# Patient Record
Sex: Female | Born: 1935 | Race: Black or African American | Hispanic: No | Marital: Married | Smoking: Never smoker
Health system: Southern US, Community
[De-identification: ages and names within clinical notes are randomized; demographics above are authoritative.]

## PROBLEM LIST (undated history)

## (undated) DIAGNOSIS — E43 Unspecified severe protein-calorie malnutrition: Secondary | ICD-10-CM

## (undated) DIAGNOSIS — L89614 Pressure ulcer of right heel, stage 4: Secondary | ICD-10-CM

## (undated) DIAGNOSIS — I10 Essential (primary) hypertension: Secondary | ICD-10-CM

## (undated) DIAGNOSIS — I639 Cerebral infarction, unspecified: Secondary | ICD-10-CM

## (undated) DIAGNOSIS — E785 Hyperlipidemia, unspecified: Secondary | ICD-10-CM

## (undated) DIAGNOSIS — D473 Essential (hemorrhagic) thrombocythemia: Secondary | ICD-10-CM

## (undated) DIAGNOSIS — F0390 Unspecified dementia without behavioral disturbance: Secondary | ICD-10-CM

## (undated) DIAGNOSIS — D471 Chronic myeloproliferative disease: Secondary | ICD-10-CM

## (undated) DIAGNOSIS — E039 Hypothyroidism, unspecified: Secondary | ICD-10-CM

## (undated) HISTORY — PX: CORONARY ARTERY BYPASS GRAFT: SHX141

## (undated) HISTORY — DX: Chronic myeloproliferative disease: D47.1

## (undated) HISTORY — PX: TOTAL HIP ARTHROPLASTY: SHX124

---

## 2015-02-27 ENCOUNTER — Inpatient Hospital Stay (HOSPITAL_COMMUNITY)
Admission: EM | Admit: 2015-02-27 | Discharge: 2015-03-02 | DRG: 065 | Disposition: A | Discharge status: Home Health | Payer: Medicare HMO | Attending: Internal Medicine | Admitting: Internal Medicine

## 2015-02-27 ENCOUNTER — Emergency Department (HOSPITAL_COMMUNITY): Payer: Medicare HMO

## 2015-02-27 ENCOUNTER — Encounter (HOSPITAL_COMMUNITY): Payer: Self-pay

## 2015-02-27 DIAGNOSIS — R29810 Facial weakness: Secondary | ICD-10-CM | POA: Diagnosis present

## 2015-02-27 DIAGNOSIS — N289 Disorder of kidney and ureter, unspecified: Secondary | ICD-10-CM

## 2015-02-27 DIAGNOSIS — R4182 Altered mental status, unspecified: Secondary | ICD-10-CM | POA: Diagnosis not present

## 2015-02-27 DIAGNOSIS — R4701 Aphasia: Secondary | ICD-10-CM | POA: Diagnosis present

## 2015-02-27 DIAGNOSIS — N189 Chronic kidney disease, unspecified: Secondary | ICD-10-CM | POA: Diagnosis not present

## 2015-02-27 DIAGNOSIS — Z66 Do not resuscitate: Secondary | ICD-10-CM | POA: Diagnosis present

## 2015-02-27 DIAGNOSIS — Y92003 Bedroom of unspecified non-institutional (private) residence as the place of occurrence of the external cause: Secondary | ICD-10-CM

## 2015-02-27 DIAGNOSIS — I25709 Atherosclerosis of coronary artery bypass graft(s), unspecified, with unspecified angina pectoris: Secondary | ICD-10-CM | POA: Diagnosis not present

## 2015-02-27 DIAGNOSIS — I639 Cerebral infarction, unspecified: Secondary | ICD-10-CM | POA: Diagnosis not present

## 2015-02-27 DIAGNOSIS — N183 Chronic kidney disease, stage 3 (moderate): Secondary | ICD-10-CM | POA: Diagnosis not present

## 2015-02-27 DIAGNOSIS — M21371 Foot drop, right foot: Secondary | ICD-10-CM | POA: Diagnosis present

## 2015-02-27 DIAGNOSIS — G35 Multiple sclerosis: Secondary | ICD-10-CM

## 2015-02-27 DIAGNOSIS — R402252 Coma scale, best verbal response, oriented, at arrival to emergency department: Secondary | ICD-10-CM | POA: Diagnosis present

## 2015-02-27 DIAGNOSIS — R404 Transient alteration of awareness: Secondary | ICD-10-CM | POA: Diagnosis not present

## 2015-02-27 DIAGNOSIS — R69 Illness, unspecified: Secondary | ICD-10-CM | POA: Diagnosis not present

## 2015-02-27 DIAGNOSIS — I129 Hypertensive chronic kidney disease with stage 1 through stage 4 chronic kidney disease, or unspecified chronic kidney disease: Secondary | ICD-10-CM | POA: Diagnosis present

## 2015-02-27 DIAGNOSIS — F0390 Unspecified dementia without behavioral disturbance: Secondary | ICD-10-CM | POA: Diagnosis present

## 2015-02-27 DIAGNOSIS — R001 Bradycardia, unspecified: Secondary | ICD-10-CM | POA: Diagnosis not present

## 2015-02-27 DIAGNOSIS — I6789 Other cerebrovascular disease: Secondary | ICD-10-CM | POA: Diagnosis not present

## 2015-02-27 DIAGNOSIS — R402362 Coma scale, best motor response, obeys commands, at arrival to emergency department: Secondary | ICD-10-CM | POA: Diagnosis present

## 2015-02-27 DIAGNOSIS — I2510 Atherosclerotic heart disease of native coronary artery without angina pectoris: Secondary | ICD-10-CM | POA: Diagnosis present

## 2015-02-27 DIAGNOSIS — R739 Hyperglycemia, unspecified: Secondary | ICD-10-CM | POA: Diagnosis present

## 2015-02-27 DIAGNOSIS — I6340 Cerebral infarction due to embolism of unspecified cerebral artery: Secondary | ICD-10-CM | POA: Diagnosis not present

## 2015-02-27 DIAGNOSIS — E039 Hypothyroidism, unspecified: Secondary | ICD-10-CM | POA: Diagnosis not present

## 2015-02-27 DIAGNOSIS — E785 Hyperlipidemia, unspecified: Secondary | ICD-10-CM | POA: Diagnosis present

## 2015-02-27 DIAGNOSIS — I6330 Cerebral infarction due to thrombosis of unspecified cerebral artery: Secondary | ICD-10-CM | POA: Diagnosis not present

## 2015-02-27 DIAGNOSIS — Z7902 Long term (current) use of antithrombotics/antiplatelets: Secondary | ICD-10-CM | POA: Diagnosis not present

## 2015-02-27 DIAGNOSIS — R29708 NIHSS score 8: Secondary | ICD-10-CM | POA: Diagnosis present

## 2015-02-27 DIAGNOSIS — F0391 Unspecified dementia with behavioral disturbance: Secondary | ICD-10-CM | POA: Diagnosis not present

## 2015-02-27 DIAGNOSIS — R402142 Coma scale, eyes open, spontaneous, at arrival to emergency department: Secondary | ICD-10-CM | POA: Diagnosis present

## 2015-02-27 DIAGNOSIS — G8194 Hemiplegia, unspecified affecting left nondominant side: Secondary | ICD-10-CM | POA: Diagnosis present

## 2015-02-27 DIAGNOSIS — I10 Essential (primary) hypertension: Secondary | ICD-10-CM | POA: Diagnosis not present

## 2015-02-27 DIAGNOSIS — W06XXXA Fall from bed, initial encounter: Secondary | ICD-10-CM | POA: Diagnosis not present

## 2015-02-27 DIAGNOSIS — R471 Dysarthria and anarthria: Secondary | ICD-10-CM | POA: Diagnosis present

## 2015-02-27 DIAGNOSIS — M7989 Other specified soft tissue disorders: Secondary | ICD-10-CM | POA: Diagnosis not present

## 2015-02-27 DIAGNOSIS — Z8669 Personal history of other diseases of the nervous system and sense organs: Secondary | ICD-10-CM | POA: Diagnosis not present

## 2015-02-27 HISTORY — DX: Essential (primary) hypertension: I10

## 2015-02-27 HISTORY — DX: Unspecified dementia, unspecified severity, without behavioral disturbance, psychotic disturbance, mood disturbance, and anxiety: F03.90

## 2015-02-27 LAB — COMPREHENSIVE METABOLIC PANEL
ALBUMIN: 3.3 g/dL — AB (ref 3.5–5.0)
ALT: 19 U/L (ref 14–54)
ANION GAP: 10 (ref 5–15)
AST: 24 U/L (ref 15–41)
Alkaline Phosphatase: 73 U/L (ref 38–126)
BUN: 38 mg/dL — ABNORMAL HIGH (ref 6–20)
CHLORIDE: 106 mmol/L (ref 101–111)
CO2: 24 mmol/L (ref 22–32)
Calcium: 9.8 mg/dL (ref 8.9–10.3)
Creatinine, Ser: 1.37 mg/dL — ABNORMAL HIGH (ref 0.44–1.00)
GFR calc Af Amer: 40 mL/min — ABNORMAL LOW (ref >60)
GFR calc non Af Amer: 34 mL/min — ABNORMAL LOW (ref >60)
GLUCOSE: 155 mg/dL — AB (ref 65–99)
POTASSIUM: 3.9 mmol/L (ref 3.5–5.1)
SODIUM: 140 mmol/L (ref 135–145)
TOTAL PROTEIN: 6.9 g/dL (ref 6.5–8.1)
Total Bilirubin: 0.3 mg/dL (ref 0.3–1.2)

## 2015-02-27 LAB — I-STAT CHEM 8, ED
BUN: 40 mg/dL — ABNORMAL HIGH (ref 6–20)
CALCIUM ION: 1.22 mmol/L (ref 1.13–1.30)
CHLORIDE: 107 mmol/L (ref 101–111)
Creatinine, Ser: 1.3 mg/dL — ABNORMAL HIGH (ref 0.44–1.00)
GLUCOSE: 147 mg/dL — AB (ref 65–99)
HCT: 49 % — ABNORMAL HIGH (ref 36.0–46.0)
Hemoglobin: 16.7 g/dL — ABNORMAL HIGH (ref 12.0–15.0)
Potassium: 3.8 mmol/L (ref 3.5–5.1)
Sodium: 142 mmol/L (ref 135–145)
TCO2: 24 mmol/L (ref 0–100)

## 2015-02-27 LAB — TSH: TSH: 3.687 u[IU]/mL (ref 0.350–4.500)

## 2015-02-27 LAB — CBC
HEMATOCRIT: 45.3 % (ref 36.0–46.0)
HEMOGLOBIN: 14.7 g/dL (ref 12.0–15.0)
MCH: 25.3 pg — ABNORMAL LOW (ref 26.0–34.0)
MCHC: 32.5 g/dL (ref 30.0–36.0)
MCV: 77.8 fL — AB (ref 78.0–100.0)
Platelets: 863 10*3/uL — ABNORMAL HIGH (ref 150–400)
RBC: 5.82 MIL/uL — AB (ref 3.87–5.11)
RDW: 16.9 % — AB (ref 11.5–15.5)
WBC: 12.1 10*3/uL — AB (ref 4.0–10.5)

## 2015-02-27 LAB — RAPID URINE DRUG SCREEN, HOSP PERFORMED
Amphetamines: NOT DETECTED
BENZODIAZEPINES: NOT DETECTED
Barbiturates: NOT DETECTED
COCAINE: NOT DETECTED
OPIATES: NOT DETECTED
TETRAHYDROCANNABINOL: NOT DETECTED

## 2015-02-27 LAB — URINALYSIS, ROUTINE W REFLEX MICROSCOPIC
Bilirubin Urine: NEGATIVE
GLUCOSE, UA: NEGATIVE mg/dL
KETONES UR: NEGATIVE mg/dL
Nitrite: POSITIVE — AB
PH: 5.5 (ref 5.0–8.0)
Protein, ur: NEGATIVE mg/dL
Specific Gravity, Urine: 1.01 (ref 1.005–1.030)

## 2015-02-27 LAB — DIFFERENTIAL
BASOS PCT: 0 %
Basophils Absolute: 0 10*3/uL (ref 0.0–0.1)
EOS ABS: 0.3 10*3/uL (ref 0.0–0.7)
EOS PCT: 3 %
LYMPHS ABS: 1.6 10*3/uL (ref 0.7–4.0)
LYMPHS PCT: 13 %
MONOS PCT: 4 %
Monocytes Absolute: 0.5 10*3/uL (ref 0.1–1.0)
NEUTROS ABS: 9.7 10*3/uL — AB (ref 1.7–7.7)
Neutrophils Relative %: 80 %

## 2015-02-27 LAB — I-STAT TROPONIN, ED: Troponin i, poc: 0.02 ng/mL (ref 0.00–0.08)

## 2015-02-27 LAB — URINE MICROSCOPIC-ADD ON

## 2015-02-27 LAB — APTT: aPTT: 31 seconds (ref 24–37)

## 2015-02-27 LAB — AMMONIA: Ammonia: 34 umol/L (ref 9–35)

## 2015-02-27 LAB — PROTIME-INR
INR: 1.19 (ref 0.00–1.49)
Prothrombin Time: 15.2 seconds (ref 11.6–15.2)

## 2015-02-27 LAB — ETHANOL

## 2015-02-27 NOTE — Consult Note (Signed)
Stroke Consult Consulting Physician: Dr Tyrone Nine ED  Chief Complaint: altered mental status  HPI: Rachel [name] is an 81 y.o. female hx of HTN and dementia brought in by EMS with transient altered mental status and speech difficulties. History obtained via patients family. They report patient had a fall this morning but appeared fine immediately after. Later noted to have change in her speech, increased slurring and appeared more confused. Family notes she did not appear to be using her left hand as well. These symptoms lasted around 1 hour though family reports she still is not back to her baseline.   CT head imaging shows no acute process.   Date last known well: 02/27/2015 Time last known well: 0800 tPA Given: No: Outside tPA window Modified Rankin: Rankin Score=3  Past Medical History  Diagnosis Date  . Dementia   . Hypertension     History reviewed. No pertinent past surgical history.  History reviewed. No pertinent family history. Social History:  reports that she has never smoked. She has never used smokeless tobacco. She reports that she does not drink alcohol or use illicit drugs.  Allergies: Allergies not on file   (Not in a hospital admission)  ROS: Out of a complete 14 system review, the patient complains of only the following symptoms, and all other reviewed systems are negative. +confusion, slurred speech   Physical Examination: Filed Vitals:   02/27/15 1831 02/27/15 1844  BP:    Pulse: 53 57  Temp:    Resp: 15 18   Physical Exam  Constitutional: He appears well-developed and well-nourished.  Psych: Affect appropriate to situation Eyes: No scleral injection HENT: No OP obstrucion Head: Normocephalic.  Cardiovascular: Normal rate and regular rhythm.  Respiratory: Effort normal and breath sounds normal.  GI: Soft. Bowel sounds are normal. No distension. There is no tenderness.  Skin: WDI  Neurologic Examination: Mental Status: Alert, oriented to  name only.. Difficulty naming objects. Will follow simple commands. Mild dysarthria noted.  Cranial Nerves: II: optic discs not visualized, blinks to threat bilaterally, pupils equal, round, reactive to light  III,IV, VI: ptosis left eye, extra-ocular motions intact bilaterally V,VII: left facial weakness (family notes is chronic), facial light touch sensation normal bilaterally VIII: hearing normal bilaterally IX,X: gag reflex present XI: trapezius strength/neck flexion strength normal bilaterally XII: tongue strength normal  Motor: Unable to formally test due to mental status. Decreased movement of left UE compared to right Tone and bulk:normal tone throughout; no atrophy noted Sensory: Pinprick and light touch intact throughout, bilaterally Deep Tendon Reflexes: 1+ and symmetric throughout Plantars: Right: downgoing   Left: downgoing Cerebellar: Not following commands, unable to test Gait: deferred  Laboratory Studies:   Basic Metabolic Panel:  Recent Labs Lab 02/27/15 1855  NA 142  K 3.8  CL 107  GLUCOSE 147*  BUN 40*  CREATININE 1.30*    Liver Function Tests: No results for input(s): AST, ALT, ALKPHOS, BILITOT, PROT, ALBUMIN in the last 168 hours. No results for input(s): LIPASE, AMYLASE in the last 168 hours. No results for input(s): AMMONIA in the last 168 hours.  CBC:  Recent Labs Lab 02/27/15 1855  HGB 16.7*  HCT 49.0*    Cardiac Enzymes: No results for input(s): CKTOTAL, CKMB, CKMBINDEX, TROPONINI in the last 168 hours.  BNP: Invalid input(s): POCBNP  CBG: No results for input(s): GLUCAP in the last 168 hours.  Microbiology: No results found for this or any previous visit.  Coagulation Studies: No results for input(s): LABPROT, INR  in the last 72 hours.  Urinalysis: No results for input(s): COLORURINE, LABSPEC, PHURINE, GLUCOSEU, HGBUR, BILIRUBINUR, KETONESUR, PROTEINUR, UROBILINOGEN, NITRITE, LEUKOCYTESUR in the last 168 hours.  Invalid  input(s): APPERANCEUR  Lipid Panel:  No results found for: CHOL, TRIG, HDL, CHOLHDL, VLDL, LDLCALC  HgbA1C: No results found for: HGBA1C  Urine Drug Screen:  No results found for: LABOPIA, COCAINSCRNUR, LABBENZ, AMPHETMU, THCU, LABBARB  Alcohol Level: No results for input(s): ETH in the last 168 hours.  Other results:  Imaging: Ct Head Wo Contrast  02/27/2015  CLINICAL DATA:  Altered mental status, history of dementia. Left facial droop EXAM: CT HEAD WITHOUT CONTRAST TECHNIQUE: Contiguous axial images were obtained from the base of the skull through the vertex without contrast. COMPARISON:  None FINDINGS: Diffuse brain atrophy with chronic white matter microvascular ischemic changes throughout the cerebral hemispheres. Cerebellar atrophy as well. No acute intracranial hemorrhage, definite infarction, mass lesion, midline shift, herniation, hydrocephalus, or extra-axial fluid collection. Orbits are symmetric. Mastoids and sinuses remain clear. Limited exam because of patient positioning. IMPRESSION: Chronic atrophy and white matter microvascular ischemic changes. No acute intracranial finding by noncontrast CT. Electronically Signed   By: Jerilynn Mages.  Shick M.D.   On: 02/27/2015 19:19    Assessment: 81 y.o. female hx of HTN, dementia presenting with transient word finding difficulties, left sided weakness and altered mental status. TIA/CVA in the differential. Will also need to rule out infectious or metabolic etiology.   -MRI brain. If positive complete rest of stroke workup -check CBC, CMP, UA, UDS, ammonia, TSH   Jim Like, DO [facility] [phone]  If 7pm- 7am, please page neurology on call as listed in [facility]. 02/27/2015, 7:34 PM

## 2015-02-27 NOTE — ED Notes (Signed)
Pt/family would like to wait on IV until it was needed for care.

## 2015-02-27 NOTE — ED Notes (Signed)
Patient undressed, in gown, on monitor, continuous pulse oximetry and blood pressure cuff; visitors at bedside 

## 2015-02-27 NOTE — ED Notes (Signed)
Pt arrived via [facility] from home c/o AMS.  Family states pt was drooling and her speech was off.  Pt has hx of dementia.  Family states left sided facial droop for the last year.  Speech is clear on arrival.

## 2015-02-27 NOTE — ED Provider Notes (Signed)
CSN: [id number]     Arrival date & time 02/27/15  1749 History   First MD Initiated Contact with Patient 02/27/15 1800     Chief Complaint  Patient presents with  . Altered Mental Status     (Consider location/radiation/quality/duration/timing/severity/associated sxs/prior Treatment) Patient is a 81 y.o. female presenting with Acute Neurological Problem. The history is provided by the patient.  Cerebrovascular Accident This is a new problem. The current episode started 6 to 12 hours ago. The problem occurs rarely. The problem has been resolved. Pertinent negatives include no chest pain, no abdominal pain, no headaches and no shortness of breath. Nothing aggravates the symptoms. Nothing relieves the symptoms. She has tried nothing for the symptoms. The treatment provided no relief.    81 yo F with a chief complaint of right-sided facial droop. Patient was last seen well about 6 hours ago. Family states that she was drooling and have difficulty with talking. This resolved over the course of about 5 minutes. Patient is currently asymptomatic denies any complaints. Has a history of MS with bilateral lower extremity weakness.  Past Medical History  Diagnosis Date  . Dementia   . Hypertension    History reviewed. No pertinent past surgical history. History reviewed. No pertinent family history. Social History  Substance Use Topics  . Smoking status: Never Smoker   . Smokeless tobacco: Never Used  . Alcohol Use: No   OB History    No data available     Review of Systems  Constitutional: Negative for fever and chills.  HENT: Negative for congestion and rhinorrhea.   Eyes: Negative for redness and visual disturbance.  Respiratory: Negative for shortness of breath and wheezing.   Cardiovascular: Negative for chest pain and palpitations.  Gastrointestinal: Negative for nausea, vomiting and abdominal pain.  Genitourinary: Negative for dysuria and urgency.  Musculoskeletal: Negative for  myalgias and arthralgias.  Skin: Negative for pallor and wound.  Neurological: Positive for facial asymmetry and speech difficulty. Negative for dizziness and headaches.      Allergies  Review of patient's allergies indicates not on file.  Home Medications   Prior to Admission medications   Medication Sig Start Date End Date Taking? Authorizing Provider  Cholecalciferol (VITAMIN D3) 5000 UNITS CAPS Take 5,000 Units by mouth daily.   Yes Historical Provider, MD  levothyroxine (SYNTHROID, LEVOTHROID) 25 MCG tablet Take 25 mcg by mouth daily before breakfast.   Yes Historical Provider, MD  lisinopril (PRINIVIL,ZESTRIL) 20 MG tablet Take 20 mg by mouth daily.   Yes Historical Provider, MD  Multiple Vitamin (MULTIVITAMIN WITH MINERALS) TABS tablet Take 1 tablet by mouth daily.   Yes Historical Provider, MD   BP 130/65 mmHg  Pulse 53  Temp(Src) 98.1 F (36.7 C) (Oral)  Resp 16  SpO2 97% Physical Exam  Constitutional: She is oriented to person, place, and time. She appears well-developed and well-nourished. No distress.  HENT:  Head: Normocephalic and atraumatic.  Eyes: EOM are normal. Pupils are equal, round, and reactive to light.  Neck: Normal range of motion. Neck supple.  Cardiovascular: Normal rate and regular rhythm.  Exam reveals no gallop and no friction rub.   No murmur heard. Pulmonary/Chest: Effort normal. She has no wheezes. She has no rales.  Abdominal: Soft. She exhibits no distension. There is no tenderness. There is no rebound and no guarding.  Musculoskeletal: She exhibits no edema or tenderness.  Neurological: She is alert and oriented to person, place, and time. No cranial nerve deficit. Coordination  normal. GCS eye subscore is 4. GCS verbal subscore is 5. GCS motor subscore is 6.  Patient is unable to move either leg. This is chronic finding for this patient. No reflexes.  Skin: Skin is warm and dry. She is not diaphoretic.  Psychiatric: She has a normal mood and  affect. Her behavior is normal.  Nursing note and vitals reviewed.   ED Course  Procedures (including critical care time) Labs Review Labs Reviewed  CBC - Abnormal; Notable for the following:    WBC 12.1 (*)    RBC 5.82 (*)    MCV 77.8 (*)    MCH 25.3 (*)    RDW 16.9 (*)    Platelets 863 (*)    All other components within normal limits  DIFFERENTIAL - Abnormal; Notable for the following:    Neutro Abs 9.7 (*)    All other components within normal limits  COMPREHENSIVE METABOLIC PANEL - Abnormal; Notable for the following:    Glucose, Bld 155 (*)    BUN 38 (*)    Creatinine, Ser 1.37 (*)    Albumin 3.3 (*)    GFR calc non Af Amer 34 (*)    GFR calc Af Amer 40 (*)    All other components within normal limits  URINALYSIS, ROUTINE W REFLEX MICROSCOPIC (NOT AT [facility]) - Abnormal; Notable for the following:    Hgb urine dipstick TRACE (*)    Nitrite POSITIVE (*)    Leukocytes, UA SMALL (*)    All other components within normal limits  URINE MICROSCOPIC-ADD ON - Abnormal; Notable for the following:    Squamous Epithelial / LPF 6-30 (*)    Bacteria, UA MANY (*)    All other components within normal limits  I-STAT CHEM 8, ED - Abnormal; Notable for the following:    BUN 40 (*)    Creatinine, Ser 1.30 (*)    Glucose, Bld 147 (*)    Hemoglobin 16.7 (*)    HCT 49.0 (*)    All other components within normal limits  ETHANOL  PROTIME-INR  APTT  URINE RAPID DRUG SCREEN, HOSP PERFORMED  AMMONIA  TSH  I-STAT TROPOININ, ED    Imaging Review Ct Head Wo Contrast  02/27/2015  CLINICAL DATA:  Altered mental status, history of dementia. Left facial droop EXAM: CT HEAD WITHOUT CONTRAST TECHNIQUE: Contiguous axial images were obtained from the base of the skull through the vertex without contrast. COMPARISON:  None FINDINGS: Diffuse brain atrophy with chronic white matter microvascular ischemic changes throughout the cerebral hemispheres. Cerebellar atrophy as well. No acute intracranial  hemorrhage, definite infarction, mass lesion, midline shift, herniation, hydrocephalus, or extra-axial fluid collection. Orbits are symmetric. Mastoids and sinuses remain clear. Limited exam because of patient positioning. IMPRESSION: Chronic atrophy and white matter microvascular ischemic changes. No acute intracranial finding by noncontrast CT. Electronically Signed   By: Jerilynn Mages.  Shick M.D.   On: 02/27/2015 19:19   Mr Brain Wo Contrast  02/27/2015  CLINICAL DATA:  Initial evaluation for acute transient altered mental status with speech difficulties. Also inability to use left hand. EXAM: MRI HEAD WITHOUT CONTRAST TECHNIQUE: Multiplanar, multiecho pulse sequences of the brain and surrounding structures were obtained without intravenous contrast. COMPARISON:  Prior CT from earlier the same day. FINDINGS: Study is degraded by motion. Diffuse prominence of the CSF containing spaces is compatible with generalized age-related cerebral atrophy, advanced in nature. Confluent T2/FLAIR hyperintensity within the periventricular and deep white matter both cerebral hemispheres most consistent with chronic  small vessel ischemic disease. Tiny 7 mm focus of subtly increased restricted diffusion present within the right basal ganglia/corona radiata (series 3, image 29), suspicious for acute or possibly early subacute ischemic infarct. Additional tiny 7 mm focus of restricted diffusion within the cortical gray matter of the right parietal lobe (series 3, image 33). No associated hemorrhage or mass effect. Subtly increased diffusion abnormality measuring approximately 7 mm within the left corona radiata may reflect an additional acute/subacute infarct (series 3, image 31). No other infarct. Major intracranial vascular flow voids are maintained. No acute intracranial hemorrhage. Small focus of susceptibility artifact within the left occipital lobe noted, likely a small chronic micro hemorrhage, most commonly from underlying chronic  hypertension. Is no mass lesion, midline shift, or mass effect. Ventricular prominence related to global parenchymal volume loss present without hydrocephalus. No extra-axial fluid collection. Craniocervical junction within normal limits. Advanced multilevel degenerative spondylolysis present within the visualized upper cervical spine. Thickening of the tectorial membrane. Pituitary gland mildly prominent with convex border superiorly. No focal mass. No acute abnormality about the orbits. Mild mucosal thickening within the ethmoidal air cells. Paranasal sinuses are otherwise largely clear. No mastoid effusion. Inner ear structures within normal limits. Bone marrow signal intensity within normal limits. No scalp soft tissue abnormality. IMPRESSION: 1. 7 mm acute cortical ischemic infarct within the right parietal lobe. No mass effect or hemorrhage. 2. Additional subtle 7 mm foci of mildly increased signal intensity within the bilateral corona radiata, suspicious for acute or early subacute ischemic infarcts. 3. Advanced cerebral atrophy with chronic microvascular ischemic disease. Electronically Signed   By: Jeannine Boga M.D.   On: 02/27/2015 21:43   I have personally reviewed and evaluated these images and lab results as part of my medical decision-making.   EKG Interpretation   Date/Time:  Tuesday February 27 2015 18:09:58 EST Ventricular Rate:  57 PR Interval:  167 QRS Duration: 83 QT Interval:  426 QTC Calculation: 415 R Axis:   44 Text Interpretation:  Sinus rhythm background noise No old tracing to  compare Confirmed by Trenden Hazelrigg MD, DANIEL [phone]) on 02/27/2015 6:50:46 PM      MDM   Final diagnoses:  Cerebrovascular accident (CVA) due to embolism of cerebral artery ([place])    81 yo F with a chief complaint of right-sided facial droop and drooling. Also aphasia.  The symptoms are resolved by the time I evaluated her. Discussed the case with Dr. Leonel Ramsay, neurology. Agreed to not  activate code stroke at this time. Will obtain a CT of the head will have neurology evaluate. Patient was evaluated by neurology there currently refusing inpatient admission for TIA workup. Recommended MRI to rule out acute stroke.  MRI with acute stroke. We'll admit.  The patients results and plan were reviewed and discussed.   Any x-rays performed were independently reviewed by myself.   Differential diagnosis were considered with the presenting HPI.  Medications - No data to display  Filed Vitals:   02/27/15 2230 02/27/15 2231 02/27/15 2245 02/27/15 2300  BP: 162/64  156/66 130/65  Pulse:  52 51 53  Temp:      TempSrc:      Resp:  18 15 16   SpO2:  97% 97% 97%    Final diagnoses:  Cerebrovascular accident (CVA) due to embolism of cerebral artery ([place])    Admission/ observation were discussed with the admitting physician, patient and/or family and they are comfortable with the plan.   Deno Etienne, DO 02/27/15 2319

## 2015-02-28 ENCOUNTER — Encounter (HOSPITAL_COMMUNITY): Payer: Self-pay | Admitting: *Deleted

## 2015-02-28 ENCOUNTER — Inpatient Hospital Stay (HOSPITAL_COMMUNITY): Payer: Medicare HMO

## 2015-02-28 DIAGNOSIS — G35 Multiple sclerosis: Secondary | ICD-10-CM

## 2015-02-28 DIAGNOSIS — F0390 Unspecified dementia without behavioral disturbance: Secondary | ICD-10-CM

## 2015-02-28 DIAGNOSIS — M21371 Foot drop, right foot: Secondary | ICD-10-CM

## 2015-02-28 DIAGNOSIS — I639 Cerebral infarction, unspecified: Secondary | ICD-10-CM

## 2015-02-28 DIAGNOSIS — R739 Hyperglycemia, unspecified: Secondary | ICD-10-CM | POA: Diagnosis present

## 2015-02-28 DIAGNOSIS — N289 Disorder of kidney and ureter, unspecified: Secondary | ICD-10-CM

## 2015-02-28 DIAGNOSIS — I6340 Cerebral infarction due to embolism of unspecified cerebral artery: Secondary | ICD-10-CM

## 2015-02-28 DIAGNOSIS — Z8669 Personal history of other diseases of the nervous system and sense organs: Secondary | ICD-10-CM

## 2015-02-28 DIAGNOSIS — M7989 Other specified soft tissue disorders: Secondary | ICD-10-CM

## 2015-02-28 DIAGNOSIS — R001 Bradycardia, unspecified: Secondary | ICD-10-CM | POA: Diagnosis present

## 2015-02-28 DIAGNOSIS — I6789 Other cerebrovascular disease: Secondary | ICD-10-CM

## 2015-02-28 DIAGNOSIS — E785 Hyperlipidemia, unspecified: Secondary | ICD-10-CM | POA: Diagnosis present

## 2015-02-28 DIAGNOSIS — I25709 Atherosclerosis of coronary artery bypass graft(s), unspecified, with unspecified angina pectoris: Secondary | ICD-10-CM

## 2015-02-28 DIAGNOSIS — E039 Hypothyroidism, unspecified: Secondary | ICD-10-CM

## 2015-02-28 LAB — CBC
HCT: 45.9 % (ref 36.0–46.0)
Hemoglobin: 14.7 g/dL (ref 12.0–15.0)
MCH: 24.6 pg — AB (ref 26.0–34.0)
MCHC: 32 g/dL (ref 30.0–36.0)
MCV: 76.8 fL — AB (ref 78.0–100.0)
PLATELETS: 774 10*3/uL — AB (ref 150–400)
RBC: 5.98 MIL/uL — ABNORMAL HIGH (ref 3.87–5.11)
RDW: 16.8 % — AB (ref 11.5–15.5)
WBC: 10.1 10*3/uL (ref 4.0–10.5)

## 2015-02-28 LAB — BASIC METABOLIC PANEL
Anion gap: 9 (ref 5–15)
BUN: 28 mg/dL — AB (ref 6–20)
CO2: 27 mmol/L (ref 22–32)
Calcium: 9.6 mg/dL (ref 8.9–10.3)
Chloride: 108 mmol/L (ref 101–111)
Creatinine, Ser: 1.37 mg/dL — ABNORMAL HIGH (ref 0.44–1.00)
GFR calc Af Amer: 40 mL/min — ABNORMAL LOW (ref >60)
GFR, EST NON AFRICAN AMERICAN: 34 mL/min — AB (ref >60)
GLUCOSE: 158 mg/dL — AB (ref 65–99)
POTASSIUM: 3.8 mmol/L (ref 3.5–5.1)
Sodium: 144 mmol/L (ref 135–145)

## 2015-02-28 LAB — LIPID PANEL
CHOL/HDL RATIO: 3.2 ratio
CHOLESTEROL: 205 mg/dL — AB (ref 0–200)
HDL: 65 mg/dL (ref >40)
LDL Cholesterol: 113 mg/dL — ABNORMAL HIGH (ref 0–99)
TRIGLYCERIDES: 134 mg/dL (ref <150)
VLDL: 27 mg/dL (ref 0–40)

## 2015-02-28 MED ORDER — SENNOSIDES-DOCUSATE SODIUM 8.6-50 MG PO TABS: 1.0000 | ORAL_TABLET | Freq: Every evening | ORAL | Status: DC | PRN

## 2015-02-28 MED ORDER — ENOXAPARIN SODIUM 30 MG/0.3ML ~~LOC~~ SOLN
30.0000 mg | SUBCUTANEOUS | Status: DC
  Administered 2015-02-28 – 2015-03-02 (×3): 30 mg via SUBCUTANEOUS
  Filled 2015-02-28 (×3): qty 0.3

## 2015-02-28 MED ORDER — ADULT MULTIVITAMIN W/MINERALS CH
1.0000 | ORAL_TABLET | Freq: Every day | ORAL | Status: DC
  Administered 2015-02-28 – 2015-03-02 (×3): 1 via ORAL
  Filled 2015-02-28 (×3): qty 1

## 2015-02-28 MED ORDER — ACETAMINOPHEN 325 MG PO TABS
650.0000 mg | ORAL_TABLET | ORAL | Status: DC | PRN
  Administered 2015-02-28: 650 mg via ORAL
  Filled 2015-02-28: qty 2

## 2015-02-28 MED ORDER — LEVOTHYROXINE SODIUM 25 MCG PO TABS
25.0000 ug | ORAL_TABLET | Freq: Every day | ORAL | Status: DC
  Administered 2015-03-01 – 2015-03-02 (×2): 25 ug via ORAL
  Filled 2015-02-28 (×2): qty 1

## 2015-02-28 MED ORDER — STROKE: EARLY STAGES OF RECOVERY BOOK
Freq: Once | Status: AC
  Administered 2015-02-28: 04:00:00

## 2015-02-28 MED ORDER — ACETAMINOPHEN 650 MG RE SUPP: 650.0000 mg | RECTAL | Status: DC | PRN

## 2015-02-28 MED ORDER — VITAMIN D3 25 MCG (1000 UNIT) PO TABS
5000.0000 [IU] | ORAL_TABLET | Freq: Every day | ORAL | Status: DC
  Administered 2015-02-28 – 2015-03-02 (×3): 5000 [IU] via ORAL
  Filled 2015-02-28 (×7): qty 5

## 2015-02-28 MED ORDER — LISINOPRIL 20 MG PO TABS
20.0000 mg | ORAL_TABLET | Freq: Every day | ORAL | Status: DC
  Administered 2015-02-28 – 2015-03-02 (×3): 20 mg via ORAL
  Filled 2015-02-28 (×3): qty 1

## 2015-02-28 MED ORDER — SODIUM CHLORIDE 0.9 % IV SOLN
INTRAVENOUS | Status: DC
  Administered 2015-02-28 (×2): via INTRAVENOUS

## 2015-02-28 MED ORDER — PRAVASTATIN SODIUM 20 MG PO TABS
20.0000 mg | ORAL_TABLET | Freq: Every day | ORAL | Status: DC
  Administered 2015-02-28 – 2015-03-01 (×2): 20 mg via ORAL
  Filled 2015-02-28 (×2): qty 1

## 2015-02-28 MED ORDER — CLOPIDOGREL BISULFATE 75 MG PO TABS
75.0000 mg | ORAL_TABLET | Freq: Every day | ORAL | Status: DC
  Administered 2015-02-28 – 2015-03-02 (×3): 75 mg via ORAL
  Filled 2015-02-28 (×3): qty 1

## 2015-02-28 MED ORDER — INFLUENZA VAC SPLIT QUAD 0.5 ML IM SUSY
0.5000 mL | PREFILLED_SYRINGE | INTRAMUSCULAR | Status: AC
  Administered 2015-03-01: 0.5 mL via INTRAMUSCULAR
  Filled 2015-02-28: qty 0.5

## 2015-02-28 NOTE — Progress Notes (Signed)
[facility] PROGRESS NOTE  Rachel [name] [id number] DOB: 20-Jan-1928 DOA: 02/27/2015 PCP: Rachell Cipro, MD  Assessment/Plan:  Acute ischemic stroke [facility]): Patient with acute signs of slurred speech with left-sided facial droop causing drooling. CT negative. MRI positive for right parietal infarct measuring 7 mm with secondary area of possible infarction.  -Neurology consulted recommended completion of stroke workup. -Echocardiogram:  No cardiac source of emboli was indentified. - vascular duplex ultrasound of the carotids -ammonia normal -Neuro checks  -PT/OT/speech eval -Social work/care management consults as family would like to take patient home when stable for discharge -permissive HTN. Might need to start Norvasc, prior to discharge.  -on plavix.   Hyperglycemia: Mild. Blood glucose elevated at 155. -Follow-up hemoglobin A1c  Bradycardia:  Asymptomatic.  Nitrates positive in urine; check urine culture.   History of coronary artery disease - continue Plavix  Renal insufficiency: Baseline creatinine unknown upon admission into the hospital creatinine 1.37 BUN elevated at 38. Suspect this to be prerenal in nature. -Gentle IV fluids normal saline at 55ml/hr -Follow-up BMP  Hypothyroidism -Follow-up TSH -Continue levothyroxine   HLD -Continue pravastatin  History of multiple sclerosis with Right foot drop: Stable  Code Status: DNR Family Communication: granddaughter at bedside.  Disposition Plan: remain inpatient to complete stroke work up,    Consultants:  Neurology  Procedures:  ECHO;  No cardiac source of emboli was indentified.  Doppler:   Antibiotics:  none  HPI/Subjective: She is alert, eating lunch. Speech more clear.   Objective: Filed Vitals:   02/28/15 0900 02/28/15 1000  BP: 180/63 188/72  Pulse: 50 51  Temp: 98.6 F (37 C) 98.1 F (36.7 C)  Resp: 16 16   No intake or output data in the 24 hours ending 02/28/15  1305 Filed Weights   02/28/15 0115  Weight: 45.859 kg (101 lb 1.6 oz)    Exam:   General:  NAD  Cardiovascular: S 1, S 2 RRR  Respiratory: CTA  Abdomen: BS present.   Musculoskeletal: no edema  Neuro, speech is more clear, per daughter, left facial droop, left extremity weakness  Data Reviewed: Basic Metabolic Panel:  Recent Labs Lab 02/27/15 1844 02/27/15 1855  NA 140 142  K 3.9 3.8  CL 106 107  CO2 24  --   GLUCOSE 155* 147*  BUN 38* 40*  CREATININE 1.37* 1.30*  CALCIUM 9.8  --    Liver Function Tests:  Recent Labs Lab 02/27/15 1844  AST 24  ALT 19  ALKPHOS 73  BILITOT 0.3  PROT 6.9  ALBUMIN 3.3*   No results for input(s): LIPASE, AMYLASE in the last 168 hours.  Recent Labs Lab 02/27/15 2224  AMMONIA 34   CBC:  Recent Labs Lab 02/27/15 1844 02/27/15 1855  WBC 12.1*  --   NEUTROABS 9.7*  --   HGB 14.7 16.7*  HCT 45.3 49.0*  MCV 77.8*  --   PLT 863*  --    Cardiac Enzymes: No results for input(s): CKTOTAL, CKMB, CKMBINDEX, TROPONINI in the last 168 hours. BNP (last 3 results) No results for input(s): BNP in the last 8760 hours.  ProBNP (last 3 results) No results for input(s): PROBNP in the last 8760 hours.  CBG: No results for input(s): GLUCAP in the last 168 hours.  No results found for this or any previous visit (from the past 240 hour(s)).   Studies: Ct Head Wo Contrast  02/27/2015  CLINICAL DATA:  Altered mental status, history of dementia. Left facial droop EXAM: CT HEAD  WITHOUT CONTRAST TECHNIQUE: Contiguous axial images were obtained from the base of the skull through the vertex without contrast. COMPARISON:  None FINDINGS: Diffuse brain atrophy with chronic white matter microvascular ischemic changes throughout the cerebral hemispheres. Cerebellar atrophy as well. No acute intracranial hemorrhage, definite infarction, mass lesion, midline shift, herniation, hydrocephalus, or extra-axial fluid collection. Orbits are  symmetric. Mastoids and sinuses remain clear. Limited exam because of patient positioning. IMPRESSION: Chronic atrophy and white matter microvascular ischemic changes. No acute intracranial finding by noncontrast CT. Electronically Signed   By: Jerilynn Mages.  Shick M.D.   On: 02/27/2015 19:19   Mr Brain Wo Contrast  02/27/2015  CLINICAL DATA:  Initial evaluation for acute transient altered mental status with speech difficulties. Also inability to use left hand. EXAM: MRI HEAD WITHOUT CONTRAST TECHNIQUE: Multiplanar, multiecho pulse sequences of the brain and surrounding structures were obtained without intravenous contrast. COMPARISON:  Prior CT from earlier the same day. FINDINGS: Study is degraded by motion. Diffuse prominence of the CSF containing spaces is compatible with generalized age-related cerebral atrophy, advanced in nature. Confluent T2/FLAIR hyperintensity within the periventricular and deep white matter both cerebral hemispheres most consistent with chronic small vessel ischemic disease. Tiny 7 mm focus of subtly increased restricted diffusion present within the right basal ganglia/corona radiata (series 3, image 29), suspicious for acute or possibly early subacute ischemic infarct. Additional tiny 7 mm focus of restricted diffusion within the cortical gray matter of the right parietal lobe (series 3, image 33). No associated hemorrhage or mass effect. Subtly increased diffusion abnormality measuring approximately 7 mm within the left corona radiata may reflect an additional acute/subacute infarct (series 3, image 31). No other infarct. Major intracranial vascular flow voids are maintained. No acute intracranial hemorrhage. Small focus of susceptibility artifact within the left occipital lobe noted, likely a small chronic micro hemorrhage, most commonly from underlying chronic hypertension. Is no mass lesion, midline shift, or mass effect. Ventricular prominence related to global parenchymal volume loss  present without hydrocephalus. No extra-axial fluid collection. Craniocervical junction within normal limits. Advanced multilevel degenerative spondylolysis present within the visualized upper cervical spine. Thickening of the tectorial membrane. Pituitary gland mildly prominent with convex border superiorly. No focal mass. No acute abnormality about the orbits. Mild mucosal thickening within the ethmoidal air cells. Paranasal sinuses are otherwise largely clear. No mastoid effusion. Inner ear structures within normal limits. Bone marrow signal intensity within normal limits. No scalp soft tissue abnormality. IMPRESSION: 1. 7 mm acute cortical ischemic infarct within the right parietal lobe. No mass effect or hemorrhage. 2. Additional subtle 7 mm foci of mildly increased signal intensity within the bilateral corona radiata, suspicious for acute or early subacute ischemic infarcts. 3. Advanced cerebral atrophy with chronic microvascular ischemic disease. Electronically Signed   By: Jeannine Boga M.D.   On: 02/27/2015 21:43    Scheduled Meds: . cholecalciferol  5,000 Units Oral Daily  . clopidogrel  75 mg Oral Daily  . enoxaparin (LOVENOX) injection  30 mg Subcutaneous Q24H  . [START ON 03/01/2015] Influenza vac split quadrivalent PF  0.5 mL Intramuscular Tomorrow-1000  . levothyroxine  25 mcg Oral QAC breakfast  . lisinopril  20 mg Oral Daily  . multivitamin with minerals  1 tablet Oral Daily  . pravastatin  20 mg Oral q1800   Continuous Infusions: . sodium chloride 75 mL/hr at 02/28/15 0405    Principal Problem:   Acute ischemic stroke [facility]) Active Problems:   Bradycardia   History of multiple sclerosis  Right foot drop   Hyperglycemia   Renal insufficiency   Dementia   CAD (coronary artery disease) of artery bypass graft   Hyperlipidemia   Hypothyroidism    Time spent: 35 minutes.     Niel Hummer A  [facility] Pager [phone]. If 7PM-7AM, please contact  night-coverage at [email or web address], password [facility] 02/28/2015, 1:05 PM  LOS: 1 day

## 2015-02-28 NOTE — Progress Notes (Signed)
   02/28/15 0900  Clinical Encounter Type  Visited With Patient and family together  Visit Type Initial;Spiritual support;Social support  Referral From Family;Nurse  Consult/Referral To [place] responded to family request for prayer; pt granddaughter in room; emotional support and prayer offered; pt was resting but acknowledged [place] presence. Gwynn Burly 9:25 AM

## 2015-02-28 NOTE — Progress Notes (Signed)
*  PRELIMINARY RESULTS* Vascular Ultrasound All studies were technically difficult and limited due to patient position and patient's inability to cooperate.  Carotid Duplex (Doppler) has been completed.  Findings suggest 1-39% internal carotid artery stenosis bilaterally. Vertebral arteries are patent with antegrade flow.   Bilateral lower extremity venous duplex completed. Visualized veins of bilateral lower extremities are negative for deep vein thrombosis. There is no evidence of Baker's cyst bilaterally.   Vascular Ultrasound Transcranial Doppler has been completed.   02/28/2015  Maudry Mayhew, RVT, RDCS, RDMS

## 2015-02-28 NOTE — Progress Notes (Signed)
Echocardiogram 2D Echocardiogram has been performed.  Rachel [name] 02/28/2015, 11:18 AM

## 2015-02-28 NOTE — Evaluation (Signed)
Clinical/Bedside Swallow Evaluation Patient Details  Name: Rachel [name] MRN: [id number] Date of Birth: [birth year]  Today's Date: 02/28/2015 Time: SLP Start Time (ACUTE ONLY): 1140 SLP Stop Time (ACUTE ONLY): 1200 SLP Time Calculation (min) (ACUTE ONLY): 20 min  Past Medical History:  Past Medical History  Diagnosis Date  . Dementia   . Hypertension    Past Surgical History: History reviewed. No pertinent past surgical history. HPI:  Patient is a 81 year old female with a past medical history significant for dementia, hypertension, hypothyroidism; who presents from home with complaints of slurred speech. Granddaughter provides history as patient is unable able to do so for herself that she has significant history of dementia and is confused at baseline. Patient was noted to have eaten breakfast in bed earlier this morning and while trying to reach for a cup on the nearby table this morning she fell out of bed. Family denies that she hit her head or lost consciousness. Thereafter around 2:30 PM patient was noted to have difficulty getting her words out. Symptoms appear to coming to address in different family members checked on her and noted intermittent normal speech. They checked her blood pressure was elevated at 213/103. They have became concerned that she may have had a stroke and called EMS after patient was seen drooling out of the left side of her mouth. At baseline they note that she is able to feed herself and recognize family and is left-hand dominant. They also reported associated symptoms of decreased ability to use her left hand. Family notes that patient has a history of MS and developed a right foot drop from this years ago.   Assessment / Plan / Recommendation Clinical Impression  Pt presented with left sided facial droop and drooling saliva from left side of mouth as well. Pt did also lean to left side. Pt was not able to report any decreased sensitivity in her left side of  face. Upon presentation of all consistencies pt held bolus in mouth and controlled it well then initiated the swallow. Granddaughter reported pt is very slow to eat, take small sips/bites and chews her food very well. This was all observed during the eval as well. Pt presented with mild oral dysphagia. Due to the reduced range of movement and probable reduced sensitivity on the left side, recommend pt have a Dys 3 diet with thin liquids and perform an oral sweep after meals. Recommend follow up with SLP to check diet tolerance, review compensatory strategies and review aspiration precautions.    Aspiration Risk  No limitations    Diet Recommendation Dysphagia 3 (Mech soft);Thin liquid   Liquid Administration via: Cup;Straw Medication Administration: Whole meds with puree Supervision: Staff to assist with self feeding Compensations: Lingual sweep for clearance of pocketing Postural Changes: Seated upright at 90 degrees    Other  Recommendations Oral Care Recommendations: Oral care BID   Follow up Recommendations   (TBD)    Frequency and Duration min 1 x/week  1 week       Prognosis Prognosis for Safe Diet Advancement: Good Barriers to Reach Goals: Cognitive deficits      Swallow Study   General Date of Onset: 02/28/15 HPI: Patient is a 81 year old female with a past medical history significant for dementia, hypertension, hypothyroidism; who presents from home with complaints of slurred speech. Granddaughter provides history as patient is unable able to do so for herself that she has significant history of dementia and is confused at baseline. Patient was noted to have  eaten breakfast in bed earlier this morning and while trying to reach for a cup on the nearby table this morning she fell out of bed. Family denies that she hit her head or lost consciousness. Thereafter around 2:30 PM patient was noted to have difficulty getting her words out. Symptoms appear to coming to address in  different family members checked on her and noted intermittent normal speech. They checked her blood pressure was elevated at 213/103. They have became concerned that she may have had a stroke and called EMS after patient was seen drooling out of the left side of her mouth. At baseline they note that she is able to feed herself and recognize family and is left-hand dominant. They also reported associated symptoms of decreased ability to use her left hand. Family notes that patient has a history of MS and developed a right foot drop from this years ago. Type of Study: Bedside Swallow Evaluation Diet Prior to this Study: NPO Temperature Spikes Noted: No History of Recent Intubation: No Behavior/Cognition: Pleasant mood;Alert;Requires cueing Oral Cavity Assessment: Excessive secretions (drooling from left side of mouth) Oral Care Completed by SLP: No Oral Cavity - Dentition: Adequate natural dentition Vision: Functional for self-feeding Self-Feeding Abilities: Needs assist Patient Positioning: Upright in bed Baseline Vocal Quality: Normal Volitional Cough: Weak Volitional Swallow: Able to elicit    Oral/Motor/Sensory Function Overall Oral Motor/Sensory Function: Mild impairment Facial ROM: Reduced left Facial Symmetry: Abnormal symmetry left Facial Strength: Reduced left Facial Sensation: Reduced left Lingual ROM: Reduced left Lingual Symmetry: Abnormal symmetry left Lingual Strength: Within Functional Limits Lingual Sensation: Within Functional Limits   Ice Chips Ice chips: Within functional limits Presentation: Spoon   Thin Liquid Thin Liquid: Within functional limits Presentation: Cup;Spoon;Straw    Nectar Thick     Honey Thick     Puree Puree: Within functional limits Presentation: Spoon   Solid Solid: Within functional limits Presentation: [place], MA, CCC-SLP 02/28/2015 12:36 PM

## 2015-02-28 NOTE — Progress Notes (Signed)
STROKE TEAM PROGRESS NOTE   HISTORY Rachel [name] is an 81 y.o. female hx of HTN and dementia brought in by EMS with transient altered mental status and speech difficulties. History obtained via patients family. They report patient had a fall this morning but appeared fine immediately after. Later noted to have change in her speech, increased slurring and appeared more confused. Family notes she did not appear to be using her left hand as well. These symptoms lasted around 1 hour though family reports she still is not back to her baseline.  CT head imaging shows no acute process. she was last known well 02/27/2015 at 0800. Modified Rankin: Rankin Score=3. Patient was not administered TPA secondary to delay in arrival. She was admitted for further evaluation and treatment.   SUBJECTIVE (INTERVAL HISTORY) Her granddaughter is at the bedside.  Overall she feels her condition is unchanged. Family moved to [place] 2 years ago from [place]. family started noticing dementia after the move. Pt's granddaughter and her son care for the pt and her husband. Granddaughter works during the day. Pt needs help for most ADLs and most time nonambulatory.    OBJECTIVE Temp:  [98.1 F (36.7 C)-99 F (37.2 C)] 98.6 F (37 C) (12/07 0900) Pulse Rate:  [50-66] 50 (12/07 0900) Cardiac Rhythm:  [-] Sinus bradycardia (12/07 0133) Resp:  [15-21] 16 (12/07 0900) BP: (130-193)/(58-92) 180/63 mmHg (12/07 0900) SpO2:  [95 %-100 %] 98 % (12/07 0900) Weight:  [45.859 kg (101 lb 1.6 oz)] 45.859 kg (101 lb 1.6 oz) (12/07 0115)  CBC:  Recent Labs Lab 02/27/15 1844 02/27/15 1855  WBC 12.1*  --   NEUTROABS 9.7*  --   HGB 14.7 16.7*  HCT 45.3 49.0*  MCV 77.8*  --   PLT 863*  --     Basic Metabolic Panel:  Recent Labs Lab 02/27/15 1844 02/27/15 1855  NA 140 142  K 3.9 3.8  CL 106 107  CO2 24  --   GLUCOSE 155* 147*  BUN 38* 40*  CREATININE 1.37* 1.30*  CALCIUM 9.8  --     Lipid Panel:    Component Value  Date/Time   CHOL 205* 02/28/2015 0331   TRIG 134 02/28/2015 0331   HDL 65 02/28/2015 0331   CHOLHDL 3.2 02/28/2015 0331   VLDL 27 02/28/2015 0331   LDLCALC 113* 02/28/2015 0331   HgbA1c: No results found for: HGBA1C Urine Drug Screen:    Component Value Date/Time   LABOPIA NONE DETECTED 02/27/2015 2210   COCAINSCRNUR NONE DETECTED 02/27/2015 2210   LABBENZ NONE DETECTED 02/27/2015 2210   AMPHETMU NONE DETECTED 02/27/2015 2210   THCU NONE DETECTED 02/27/2015 2210   LABBARB NONE DETECTED 02/27/2015 2210      IMAGING  Ct Head Wo Contrast 02/27/2015   Chronic atrophy and white matter microvascular ischemic changes. No acute intracranial finding by noncontrast CT.   Mr Brain Wo Contrast 02/27/2015  1. 7 mm acute cortical ischemic infarct within the right parietal lobe. No mass effect or hemorrhage. 2. Additional subtle 7 mm foci of mildly increased signal intensity within the bilateral corona radiata, suspicious for acute or early subacute ischemic infarcts. 3. Advanced cerebral atrophy with chronic microvascular ischemic disease.  CUS - Bilateral: 1-39% ICA stenosis. Vertebral artery flow is antegrade.  LE venous doppler - negative for DVT  TCD - pending  2D echo - - Left ventricle: The cavity size was normal. There was mild concentric hypertrophy. Systolic function was vigorous. The estimated ejection  fraction was in the range of 65% to 70%. There was no dynamic obstruction. Wall motion was normal; there were no regional wall motion abnormalities. Doppler parameters are consistent with abnormal left ventricular relaxation (grade 1 diastolic dysfunction). Doppler parameters are consistent with high ventricular filling pressure. - Aortic valve: Trileaflet; moderately thickened, severely calcified leaflets. Cusp separation was mildly reduced. Transvalvular velocity was minimally increased. There was no stenosis. There was moderate regurgitation. Peak velocity  (S): 239 cm/s. Mean gradient (S): 11 mm Hg. - Mitral valve: There was moderate regurgitation. - Tricuspid valve: There was moderate regurgitation. - Pulmonary arteries: Systolic pressure was mildly increased. PA peak pressure: 42 mm Hg (S).  PHYSICAL EXAM Physical exam  Temp:  [97.5 F (36.4 C)-99 F (37.2 C)] 98.2 F (36.8 C) (12/07 1759) Pulse Rate:  [50-66] 57 (12/07 1759) Resp:  [15-21] 17 (12/07 1759) BP: (130-188)/(58-92) 170/71 mmHg (12/07 1759) SpO2:  [95 %-100 %] 96 % (12/07 1759) Weight:  [101 lb 1.6 oz (45.859 kg)] 101 lb 1.6 oz (45.859 kg) (12/07 0115)  General - Well nourished, well developed, in no apparent distress.  Ophthalmologic - Fundi not visualized due to noncooperation.  Cardiovascular - Regular rate and rhythm.  Neuro - awake alert and orientation to place, but not orientated to time or person or situation. Paucity of speech, follow limited simple commands, not complex commands. Not cooperative on name or repeat. PERRL, EOMI, right orbital fissure enlarged, with right facial droop, suspect previous bell's palsy on the right. Dysarthria. LUE pronator drift, 4/5 LUE proximal and distal. BLEs atrophy and with foot drop bilaterally, withdraw to pain stimulation. B/l positive babinski.   ASSESSMENT/PLAN [name] is a 81 y.o. female with history of HTN and dementia presenting with transient altered mental status and speech difficulties. She did not receive IV t-PA due to delay in arrival.   Stroke:  Small right parietal  lobe and bilateral corona radiata infarcts embolic secondary to unknown etiolology  MRI  Small R parietal infarct and b/l corona radiata infarcts  TCD pending  Carotid Doppler unremarkable  2D Echo  EF 65-70%   LE venous doppler negative for DVT   LDL 113  HgbA1c pending  Lovenox 40 mg sq daily for VTE prophylaxis  Diet NPO time specified  No antithrombotic prior to admission, now on clopidogrel 75 mg daily. Continue  plavix on discharge.  Patient's granddaugther counseled to be compliant with her antithrombotic medications  Ongoing aggressive stroke risk factor management  OP tele monitoring to look for afib. Given baseline condition, will not consider loop placement. Recommend 30 day cardiac event monitoring  Therapy recommendations:  HHPT  Disposition:  pending  (granddaughter cares for her and her husband at home, pt able to wash face and feeds herself, otherwise, needs assistance with ADLs, can walk short distances with walker with assistance)  Hypertension  elevated  Permissive hypertension (OK if < 220/120) but gradually normalize in 5-7 days  Hyperlipidemia  Home meds:  No statin  LDL 113, goal < 70  Now on pravachol 20 mg daily  Continue statin at discharge  Other Stroke Risk Factors  Advanced age  Other Active Problems  Multiple Sclerosis per granddaughter  Baseline Dementia, not on medication  Hospital day # 1  Neurology will sign off. Please call with questions. Pt will follow up with NP at [facility] in about 1 month. Thanks for the consult.  Rosalin Hawking, MD PhD Stroke Neurology 02/28/2015 9:47 PM     To contact  Stroke Continuity provider, please refer to [email or web address] After hours, contact General Neurology

## 2015-02-28 NOTE — H&P (Addendum)
[facility] History and Physical  Rachel [name] [id number] DOB: 1927/08/05 DOA: 02/27/2015  Referring physician: ED PCP: Rachell Cipro, MD   Chief Complaint: Slurred speech   HPI:  Patient is a 81 year old female with a past medical history significant for dementia, hypertension, hypothyroidism; who presents from home with complaints of slurred speech. Granddaughter provides history as patient is unable able to do so for herself that she has significant history of dementia and is confused at baseline. Patient was noted to have eaten breakfast in bed earlier this morning and while trying to reach for a cup on the nearby table this morning she fell out of bed. Family denies that she hit her head or lost consciousness. Thereafter around 2:30 PM patient was noted to have difficulty getting her words out. Symptoms appear to coming to address in different family members checked on her and noted intermittent normal speech. They checked her blood pressure was elevated at 213/103. They have became concerned that she may have had a stroke and called EMS after patient was seen drooling out of the left side of her mouth. At baseline they note that she is able to feed herself and recognize family and is left-hand dominant. They also reported associated symptoms of decreased ability to use her left hand. Family notes that patient has a history of MS  and developed a right foot drop from this years ago.  Upon admission to the emergency department a CT scan showed chronic changes. Subsequent MRI revealed cortical ischemic infarct within the right parietal lobe. No mass effect or hemorrhage. Additional subtle 7 mm foci of mildly increased signal intensity within the bilateral corona radiata.  Review of Systems  Constitutional: Negative for chills and weight loss.  HENT: Positive for hearing loss. Negative for ear pain.   Eyes: Negative for photophobia and pain.  Respiratory: Negative for hemoptysis and  sputum production.   Cardiovascular: Negative for palpitations and orthopnea.  Gastrointestinal: Negative for nausea and vomiting.  Genitourinary: Negative for frequency and hematuria.  Musculoskeletal: Positive for joint pain. Negative for falls.  Skin: Negative for itching and rash.  Neurological: Positive for speech change, focal weakness and weakness. Negative for seizures and loss of consciousness.       Gait disturbance, right foot drop  Endo/Heme/Allergies: Negative for polydipsia. Bruises/bleeds easily.  Psychiatric/Behavioral: Positive for memory loss. Negative for suicidal ideas and substance abuse.       Past Medical History  Diagnosis Date  . Dementia   . Hypertension      History reviewed. No pertinent past surgical history.    Social History:  reports that she has never smoked. She has never used smokeless tobacco. She reports that she does not drink alcohol or use illicit drugs. Where does patient live--home   and with whom if at home? Family Can patient participate in ADLs? Needs assistance  Allergies  Allergen Reactions  . Aspirin     History reviewed. No pertinent family history.     Prior to Admission medications   Medication Sig Start Date End Date Taking? Authorizing Provider  Cholecalciferol (VITAMIN D3) 5000 UNITS CAPS Take 5,000 Units by mouth daily.   Yes Historical Provider, MD  levothyroxine (SYNTHROID, LEVOTHROID) 25 MCG tablet Take 25 mcg by mouth daily before breakfast.   Yes Historical Provider, MD  lisinopril (PRINIVIL,ZESTRIL) 20 MG tablet Take 20 mg by mouth daily.   Yes Historical Provider, MD  Multiple Vitamin (MULTIVITAMIN WITH MINERALS) TABS tablet Take 1 tablet by mouth daily.  Yes Historical Provider, MD     Physical Exam: Filed Vitals:   02/28/15 0100 02/28/15 0115 02/28/15 0300 02/28/15 0500  BP: 186/85  148/58 167/71  Pulse: 64  57 50  Temp: 99 F (37.2 C)  98.2 F (36.8 C) 98.4 F (36.9 C)  TempSrc: Oral  Oral Oral   Resp: 16  16 16   Height:  5\' 4"  (1.626 m)    Weight:  45.859 kg (101 lb 1.6 oz)    SpO2: 96%  95% 97%     Constitutional: Vital signs reviewed. Patient is a is elderly appearing and mildly lethargic, but arousable with verbal stimuli. Head: Normocephalic and atraumatic  Ear: TM normal bilaterally  Mouth: no erythema or exudates, MMM  Eyes: PERRL, EOMI, conjunctivae normal, No scleral icterus.  Neck: Supple, Trachea midline normal ROM, No JVD, mass, thyromegaly, or carotid bruit present.  Cardiovascular: RRR, S1 normal, S2 normal, no MRG, pulses symmetric and intact bilaterally  Pulmonary/Chest: CTAB, no wheezes, rales, or rhonchi  Abdominal: Soft. Non-tender, non-distended, bowel sounds are normal, no masses, organomegaly, or guarding present.  GU: no CVA tenderness Musculoskeletal: No joint deformities, erythema, or stiffness, ROM full and no nontender Ext: no edema and no cyanosis, pulses palpable bilaterally (DP and PT)  Hematology: no cervical, inginal, or axillary adenopathy.  Neurological: A&O 1, Strenght is decreased on the left-hand side. Cannot appreciate any significant facial droop. Right-sided foot drop Skin: Warm, dry and intact. No rash, cyanosis, or clubbing.  Psychiatric: Normal mood and affect. speech and behavior is normal. Judgment and thought content normal. Cognition and memory are normal.      Data Review   Micro Results No results found for this or any previous visit (from the past 240 hour(s)).  Radiology Reports Ct Head Wo Contrast  02/27/2015  CLINICAL DATA:  Altered mental status, history of dementia. Left facial droop EXAM: CT HEAD WITHOUT CONTRAST TECHNIQUE: Contiguous axial images were obtained from the base of the skull through the vertex without contrast. COMPARISON:  None FINDINGS: Diffuse brain atrophy with chronic white matter microvascular ischemic changes throughout the cerebral hemispheres. Cerebellar atrophy as well. No acute intracranial  hemorrhage, definite infarction, mass lesion, midline shift, herniation, hydrocephalus, or extra-axial fluid collection. Orbits are symmetric. Mastoids and sinuses remain clear. Limited exam because of patient positioning. IMPRESSION: Chronic atrophy and white matter microvascular ischemic changes. No acute intracranial finding by noncontrast CT. Electronically Signed   By: Jerilynn Mages.  Shick M.D.   On: 02/27/2015 19:19   Mr Brain Wo Contrast  02/27/2015  CLINICAL DATA:  Initial evaluation for acute transient altered mental status with speech difficulties. Also inability to use left hand. EXAM: MRI HEAD WITHOUT CONTRAST TECHNIQUE: Multiplanar, multiecho pulse sequences of the brain and surrounding structures were obtained without intravenous contrast. COMPARISON:  Prior CT from earlier the same day. FINDINGS: Study is degraded by motion. Diffuse prominence of the CSF containing spaces is compatible with generalized age-related cerebral atrophy, advanced in nature. Confluent T2/FLAIR hyperintensity within the periventricular and deep white matter both cerebral hemispheres most consistent with chronic small vessel ischemic disease. Tiny 7 mm focus of subtly increased restricted diffusion present within the right basal ganglia/corona radiata (series 3, image 29), suspicious for acute or possibly early subacute ischemic infarct. Additional tiny 7 mm focus of restricted diffusion within the cortical gray matter of the right parietal lobe (series 3, image 33). No associated hemorrhage or mass effect. Subtly increased diffusion abnormality measuring approximately 7 mm within the left corona radiata  may reflect an additional acute/subacute infarct (series 3, image 31). No other infarct. Major intracranial vascular flow voids are maintained. No acute intracranial hemorrhage. Small focus of susceptibility artifact within the left occipital lobe noted, likely a small chronic micro hemorrhage, most commonly from underlying chronic  hypertension. Is no mass lesion, midline shift, or mass effect. Ventricular prominence related to global parenchymal volume loss present without hydrocephalus. No extra-axial fluid collection. Craniocervical junction within normal limits. Advanced multilevel degenerative spondylolysis present within the visualized upper cervical spine. Thickening of the tectorial membrane. Pituitary gland mildly prominent with convex border superiorly. No focal mass. No acute abnormality about the orbits. Mild mucosal thickening within the ethmoidal air cells. Paranasal sinuses are otherwise largely clear. No mastoid effusion. Inner ear structures within normal limits. Bone marrow signal intensity within normal limits. No scalp soft tissue abnormality. IMPRESSION: 1. 7 mm acute cortical ischemic infarct within the right parietal lobe. No mass effect or hemorrhage. 2. Additional subtle 7 mm foci of mildly increased signal intensity within the bilateral corona radiata, suspicious for acute or early subacute ischemic infarcts. 3. Advanced cerebral atrophy with chronic microvascular ischemic disease. Electronically Signed   By: Jeannine Boga M.D.   On: 02/27/2015 21:43     CBC  Recent Labs Lab 02/27/15 1844 02/27/15 1855  WBC 12.1*  --   HGB 14.7 16.7*  HCT 45.3 49.0*  PLT 863*  --   MCV 77.8*  --   MCH 25.3*  --   MCHC 32.5  --   RDW 16.9*  --   LYMPHSABS 1.6  --   MONOABS 0.5  --   EOSABS 0.3  --   BASOSABS 0.0  --     Chemistries   Recent Labs Lab 02/27/15 1844 02/27/15 1855  NA 140 142  K 3.9 3.8  CL 106 107  CO2 24  --   GLUCOSE 155* 147*  BUN 38* 40*  CREATININE 1.37* 1.30*  CALCIUM 9.8  --   AST 24  --   ALT 19  --   ALKPHOS 73  --   BILITOT 0.3  --    ------------------------------------------------------------------------------------------------------------------ estimated creatinine clearance is 22.9 mL/min (by C-G formula based on Cr of  1.3). ------------------------------------------------------------------------------------------------------------------ No results for input(s): HGBA1C in the last 72 hours. ------------------------------------------------------------------------------------------------------------------  Recent Labs  02/28/15 0331  CHOL 205*  HDL 65  LDLCALC 113*  TRIG 134  CHOLHDL 3.2   ------------------------------------------------------------------------------------------------------------------  Recent Labs  02/27/15 2224  TSH 3.687   ------------------------------------------------------------------------------------------------------------------ No results for input(s): VITAMINB12, FOLATE, FERRITIN, TIBC, IRON, RETICCTPCT in the last 72 hours.  Coagulation profile  Recent Labs Lab 02/27/15 1844  INR 1.19    No results for input(s): DDIMER in the last 72 hours.  Cardiac Enzymes No results for input(s): CKMB, TROPONINI, MYOGLOBIN in the last 168 hours.  Invalid input(s): CK ------------------------------------------------------------------------------------------------------------------ Invalid input(s): POCBNP   CBG: No results for input(s): GLUCAP in the last 168 hours.     EKG: Independently reviewed. Sinus rhythm Assessment/Plan    Acute ischemic stroke [facility]): Patient with acute signs of slurred speech with left-sided facial droop causing drooling. CT negative. MRI positive for right parietal infarct measuring 7 mm with secondary area of possible infarction. Neurology consulted recommended completion of stroke workup. -Echocardiogram/ vascular duplex ultrasound of the carotids -TSH/urine drug screen/ammonia level -Neuro checks  -PT/OT/speech eval -Social work/care management consults as family would like to take patient home when stable for discharge  Hyperglycemia: Mild. Blood glucose elevated at 155. -  Follow-up hemoglobin A1c  Bradycardia: Patient with heart  rates in the drop into the low 40s although patient appears to be asymptomatic. -Follow-up telemetry  History of coronary artery disease - continue Plavix  Renal insufficiency: Baseline creatinine unknown upon admission into the hospital creatinine 1.37 BUN elevated at 38. Suspect this to be prerenal in nature. -Gentle IV fluids normal saline at 60ml/hr -Follow-up BMP  Hypothyroidism -Follow-up TSH -Continue levothyroxine less needing adjustment  HLD -Continue pravastatin  History of multiple sclerosis with Right foot drop: Stable    Dementia  Code Status:   full Family Communication: bedside Disposition Plan: admit   Total time spent 55 minutes.Greater than 50% of this time was spent in counseling, explanation of diagnosis, planning of further management, and coordination of care  [facility] Pager [phone]  If 7PM-7AM, please contact night-coverage [email or web address] Password [email or web address] 02/28/2015, 6:48 AM

## 2015-02-28 NOTE — Evaluation (Signed)
Physical Therapy Evaluation Patient Details Name: Rachel [name] MRN: [id number] DOB: 1927-10-28 Today's Date: 02/28/2015   History of Present Illness  Patient is a 81 year old female with a past medical history significant for dementia, hypertension, hypothyroidism; who presents from home with complaints of slurred speech. Subsequent MRI revealed cortical ischemic infarct within the right parietal lobe. No mass effect or hemorrhage. Additional subtle 7 mm foci of mildly increased signal intensity within the bilateral corona radiata.  Clinical Impression  Patient presents with decreased mobility due to deficits listed in PT problem list.  Difficult eval due to pt's decreased level of arousal initially.  Unable to participate with bed mobility or bed to chair transfer; however, more interactive after up to chair and speech in to test swallowing.  Feel continued skilled PT in the acute setting needed to assist with decreased burden of care to allow return home with family assist and HHPT.      Follow Up Recommendations Home health PT;Supervision/Assistance - 24 hour [facility])    Equipment Recommendations  None recommended by PT    Recommendations for Other Services       Precautions / Restrictions Precautions Precautions: Fall Restrictions Weight Bearing Restrictions: No      Mobility  Bed Mobility Overal bed mobility: Needs Assistance Bed Mobility: Rolling;Sidelying to Sit Rolling: Total assist Sidelying to sit: Total assist          Transfers Overall transfer level: Needs assistance   Transfers: Squat Pivot Transfers     Squat pivot transfers: Total assist     General transfer comment: pivot to chair with assist, incontinent of urine along the way, assist to position in chair, started to initiate reaching for armrest with R UE to help with repositioning.  grandaughter assisted to scoot pt back on chair with pad.    Ambulation/Gait                Stairs            Wheelchair Mobility    Modified Rankin (Stroke Patients Only) Modified Rankin (Stroke Patients Only) Pre-Morbid Rankin Score: Moderately severe disability Modified Rankin: Severe disability     Balance Overall balance assessment: Needs assistance   Sitting balance-Leahy Scale: Zero Sitting balance - Comments: posterior and left latera lean, trunk shortened on L and poor head control, lifted head in sitting on command, but did not maintain.  Once in chair and pillow under L arm able to hold head up     Standing balance-Leahy Scale: Zero                               Pertinent Vitals/Pain Pain Assessment: No/denies pain    Home Living Family/patient expects to be discharged to:: Private residence Living Arrangements: Children Available Help at Discharge: Family;Available 24 hours/day Type of Home: House Home Access: Stairs to enter   [employer] of Steps: 3 Home Layout: One level Home Equipment: Walker - 4 wheels;Bedside commode;Tub bench;Wheelchair - manual;Hand held shower head Additional Comments: 2 BSC's one by bedside, one over toilet    Prior Function Level of Independence: Needs assistance   Gait / Transfers Assistance Needed: ambulated with assist and walker to bathroom  ADL's / Homemaking Assistance Needed: wears depends, can wash upper body, but assist for lower body, able to feed herself        Hand Dominance        Extremity/Trunk Assessment   Upper Extremity Assessment:  RUE deficits/detail;LUE deficits/detail;Difficult to assess due to impaired cognition (and decreased level of arousal) RUE Deficits / Details: moving some, but not as much as left, did not test shoulder PROM, hand, wrist and elbow WFL     LUE Deficits / Details: reaching for armrest on chair automatic without difficulty   Lower Extremity Assessment: RLE deficits/detail;LLE deficits/detail RLE Deficits / Details: both legs internally rotated in bed  and noted R foot drop; PROM WFL, note limited active movements, but assisting with movement in bed on both sides, minimal strength for OOB to chair transfer LLE Deficits / Details: both legs internally rotated in bed and noted R foot drop; PROM WFL, note limited active movements, but assisting with movement in bed on both sides, minimal strength for OOB to chair transfer     Communication   Communication: Other (comment) (responding some to grandaughter, but limited and chopy)  Cognition Arousal/Alertness: Lethargic Behavior During Therapy: Flat affect Overall Cognitive Status: Difficult to assess                      General Comments General comments (skin integrity, edema, etc.): grandaughter gives extensive history of patient and 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    Exercises        Assessment/Plan    PT Assessment Patient needs continued PT services  PT Diagnosis Generalized weakness;Hemiplegia non-dominant side   PT Problem List Decreased strength;Decreased cognition;Decreased activity tolerance;Decreased balance;Decreased mobility  PT Treatment Interventions DME instruction;Balance training;Neuromuscular re-education;Functional mobility training;Patient/family education;Therapeutic activities;Therapeutic exercise   PT Goals (Current goals can be found in the Care Plan section) Acute Rehab PT Goals Patient Stated Goal: To go home PT Goal Formulation: With family Time For Goal Achievement: 03/14/15 Potential to Achieve Goals: Good    Frequency Min 4X/week   Barriers to discharge        Co-evaluation               End of Session   Activity Tolerance: Patient limited by lethargy Patient left: in chair;with family/visitor present Nurse Communication: Mobility status         Time: [id number] PT Time Calculation (min) (ACUTE ONLY): 30 min   Charges:    PT Evaluation $Initial PT Evaluation Tier I: 1 Procedure PT Treatments $Therapeutic Activity: 8-22 mins   PT G Codes:        WYNN,CYNDI 03/22/2015, 12:09 PM  Magda Kiel, [place] 2015-03-22

## 2015-03-01 DIAGNOSIS — M21371 Foot drop, right foot: Secondary | ICD-10-CM | POA: Diagnosis not present

## 2015-03-01 DIAGNOSIS — N189 Chronic kidney disease, unspecified: Secondary | ICD-10-CM | POA: Diagnosis not present

## 2015-03-01 DIAGNOSIS — R69 Illness, unspecified: Secondary | ICD-10-CM | POA: Diagnosis not present

## 2015-03-01 DIAGNOSIS — G8194 Hemiplegia, unspecified affecting left nondominant side: Secondary | ICD-10-CM | POA: Diagnosis not present

## 2015-03-01 DIAGNOSIS — R001 Bradycardia, unspecified: Secondary | ICD-10-CM | POA: Diagnosis not present

## 2015-03-01 DIAGNOSIS — E039 Hypothyroidism, unspecified: Secondary | ICD-10-CM | POA: Diagnosis not present

## 2015-03-01 DIAGNOSIS — I6340 Cerebral infarction due to embolism of unspecified cerebral artery: Secondary | ICD-10-CM | POA: Diagnosis not present

## 2015-03-01 DIAGNOSIS — I129 Hypertensive chronic kidney disease with stage 1 through stage 4 chronic kidney disease, or unspecified chronic kidney disease: Secondary | ICD-10-CM | POA: Diagnosis not present

## 2015-03-01 DIAGNOSIS — W06XXXA Fall from bed, initial encounter: Secondary | ICD-10-CM | POA: Diagnosis not present

## 2015-03-01 DIAGNOSIS — N183 Chronic kidney disease, stage 3 (moderate): Secondary | ICD-10-CM

## 2015-03-01 DIAGNOSIS — R4701 Aphasia: Secondary | ICD-10-CM | POA: Diagnosis not present

## 2015-03-01 DIAGNOSIS — F0391 Unspecified dementia with behavioral disturbance: Secondary | ICD-10-CM

## 2015-03-01 LAB — CBC
HEMATOCRIT: 39.4 % (ref 36.0–46.0)
Hemoglobin: 12.5 g/dL (ref 12.0–15.0)
MCH: 24.3 pg — AB (ref 26.0–34.0)
MCHC: 31.7 g/dL (ref 30.0–36.0)
MCV: 76.5 fL — AB (ref 78.0–100.0)
Platelets: 728 10*3/uL — ABNORMAL HIGH (ref 150–400)
RBC: 5.15 MIL/uL — AB (ref 3.87–5.11)
RDW: 16.7 % — AB (ref 11.5–15.5)
WBC: 10.2 10*3/uL (ref 4.0–10.5)

## 2015-03-01 LAB — BASIC METABOLIC PANEL
ANION GAP: 8 (ref 5–15)
BUN: 29 mg/dL — AB (ref 6–20)
CO2: 27 mmol/L (ref 22–32)
Calcium: 9 mg/dL (ref 8.9–10.3)
Chloride: 108 mmol/L (ref 101–111)
Creatinine, Ser: 1.48 mg/dL — ABNORMAL HIGH (ref 0.44–1.00)
GFR calc Af Amer: 36 mL/min — ABNORMAL LOW (ref >60)
GFR calc non Af Amer: 31 mL/min — ABNORMAL LOW (ref >60)
GLUCOSE: 115 mg/dL — AB (ref 65–99)
POTASSIUM: 4.2 mmol/L (ref 3.5–5.1)
Sodium: 143 mmol/L (ref 135–145)

## 2015-03-01 LAB — HEMOGLOBIN A1C
Hgb A1c MFr Bld: 6 % — ABNORMAL HIGH (ref 4.8–5.6)
Mean Plasma Glucose: 126 mg/dL

## 2015-03-01 NOTE — Progress Notes (Signed)
Speech Language Pathology Treatment: Dysphagia  Patient Details Name: Rachel [name] MRN: [id number] DOB: [birth year] Today's Date: 03/01/2015 Time: 1100-1115 SLP Time Calculation (min) (ACUTE ONLY): 15 min  Assessment / Plan / Recommendation Clinical Impression  Pt alert, cooperative, although confused with some perseverative communication.  Required mod assist for self-feeding with right hand (pt states she is left handed) and mod cues to attend to left oral cavity due to spillage, residue.  Pt tolerated POs well with no overt s/s of aspiration and good motivation to eat.  Continue dysphagia 3, thin liquids, meds whole in puree.    HPI HPI: Patient is a 81 year old female with a past medical history significant for dementia, hypertension, hypothyroidism; who presents from home with complaints of slurred speech. Granddaughter provides history as patient is unable able to do so for herself that she has significant history of dementia and is confused at baseline. Patient was noted to have eaten breakfast in bed earlier this morning and while trying to reach for a cup on the nearby table this morning she fell out of bed. Family denies that she hit her head or lost consciousness. Thereafter around 2:30 PM patient was noted to have difficulty getting her words out. Symptoms appear to coming to address in different family members checked on her and noted intermittent normal speech. They checked her blood pressure was elevated at 213/103. They have became concerned that she may have had a stroke and called EMS after patient was seen drooling out of the left side of her mouth. At baseline they note that she is able to feed herself and recognize family and is left-hand dominant. They also reported associated symptoms of decreased ability to use her left hand. Family notes that patient has a history of MS and developed a right foot drop from this years ago.      SLP Plan  Continue with current plan of care      Recommendations  Diet recommendations: Dysphagia 3 (mechanical soft);Thin liquid Liquids provided via: Cup;Straw Medication Administration: Whole meds with puree Supervision: Staff to assist with self feeding Compensations: Lingual sweep for clearance of pocketing Postural Changes and/or Swallow Maneuvers: Seated upright 90 degrees              Oral Care Recommendations: Oral care BID Follow up Recommendations: None Plan: Continue with current plan of care   Juan Quam Laurice 03/01/2015, 11:19 AM

## 2015-03-01 NOTE — Progress Notes (Signed)
[facility] PROGRESS NOTE    Progress Note   Rachel [name] [id number] DOB: 18-Mar-1928 DOA: 2015-03-16 PCP: Rachell Cipro, MD   Brief Narrative:   Rachel [name] is an 81 y.o. female past medical history of dementia brought in by EMS with transient worsening in his mental status with dysarthria.  Assessment/Plan:   Acute ischemic stroke [facility]): HgbA1c 6.0, fasting lipid panel HDL > 40, LDL > 100 cont statins MRI brain without contrast showed a small right parietal infarct and bilateral corona radiate a infarct PT consult recommended home health PT. Echocardiogram unremarkable and EF of 75% extremity Doppler was negative for DVT. Carotid dopplers carotid Doppler showed less than 40% stenosis bilaterally Prophylactic therapy-Antiplatelet med: Plavix Cardiac Monitoring no events, will need an outpatient 30 day loop recorder event monitor Neurochecks q4h  Keep MAP 70 TCD pending  Hypoglycemia: A1c is 6.  History of multiple sclerosis  Chronic renal insufficiency: Baseline creatinine 1.8-1.4.    Dementia: Stable, cont current regimen  Hypothyroidism: Cont synthroid.   DVT Prophylaxis - Lovenox ordered.  Family Communication: grand daughter Disposition Plan: Home in am Code Status:     Code Status Orders        Start     Ordered   02/28/15 0150  Do not attempt resuscitation (DNR)   Continuous    Question Answer Comment  In the event of cardiac or respiratory ARREST Do not call a "code blue"   In the event of cardiac or respiratory ARREST Do not perform Intubation, CPR, defibrillation or ACLS   In the event of cardiac or respiratory ARREST Use medication by any route, position, wound care, and other measures to relive pain and suffering. May use oxygen, suction and manual treatment of airway obstruction as needed for comfort.      02/28/15 0149        IV Access:    Peripheral IV   Procedures and diagnostic studies:   Ct Head Wo  Contrast  16-Mar-2015  CLINICAL DATA:  Altered mental status, history of dementia. Left facial droop EXAM: CT HEAD WITHOUT CONTRAST TECHNIQUE: Contiguous axial images were obtained from the base of the skull through the vertex without contrast. COMPARISON:  None FINDINGS: Diffuse brain atrophy with chronic white matter microvascular ischemic changes throughout the cerebral hemispheres. Cerebellar atrophy as well. No acute intracranial hemorrhage, definite infarction, mass lesion, midline shift, herniation, hydrocephalus, or extra-axial fluid collection. Orbits are symmetric. Mastoids and sinuses remain clear. Limited exam because of patient positioning. IMPRESSION: Chronic atrophy and white matter microvascular ischemic changes. No acute intracranial finding by noncontrast CT. Electronically Signed   By: Jerilynn Mages.  Shick M.D.   On: 03/16/2015 19:19   Mr Brain Wo Contrast  03/16/15  CLINICAL DATA:  Initial evaluation for acute transient altered mental status with speech difficulties. Also inability to use left hand. EXAM: MRI HEAD WITHOUT CONTRAST TECHNIQUE: Multiplanar, multiecho pulse sequences of the brain and surrounding structures were obtained without intravenous contrast. COMPARISON:  Prior CT from earlier the same day. FINDINGS: Study is degraded by motion. Diffuse prominence of the CSF containing spaces is compatible with generalized age-related cerebral atrophy, advanced in nature. Confluent T2/FLAIR hyperintensity within the periventricular and deep white matter both cerebral hemispheres most consistent with chronic small vessel ischemic disease. Tiny 7 mm focus of subtly increased restricted diffusion present within the right basal ganglia/corona radiata (series 3, image 29), suspicious for acute or possibly early subacute ischemic infarct. Additional tiny 7 mm focus of restricted diffusion within the cortical gray  matter of the right parietal lobe (series 3, image 33). No associated hemorrhage or mass  effect. Subtly increased diffusion abnormality measuring approximately 7 mm within the left corona radiata may reflect an additional acute/subacute infarct (series 3, image 31). No other infarct. Major intracranial vascular flow voids are maintained. No acute intracranial hemorrhage. Small focus of susceptibility artifact within the left occipital lobe noted, likely a small chronic micro hemorrhage, most commonly from underlying chronic hypertension. Is no mass lesion, midline shift, or mass effect. Ventricular prominence related to global parenchymal volume loss present without hydrocephalus. No extra-axial fluid collection. Craniocervical junction within normal limits. Advanced multilevel degenerative spondylolysis present within the visualized upper cervical spine. Thickening of the tectorial membrane. Pituitary gland mildly prominent with convex border superiorly. No focal mass. No acute abnormality about the orbits. Mild mucosal thickening within the ethmoidal air cells. Paranasal sinuses are otherwise largely clear. No mastoid effusion. Inner ear structures within normal limits. Bone marrow signal intensity within normal limits. No scalp soft tissue abnormality. IMPRESSION: 1. 7 mm acute cortical ischemic infarct within the right parietal lobe. No mass effect or hemorrhage. 2. Additional subtle 7 mm foci of mildly increased signal intensity within the bilateral corona radiata, suspicious for acute or early subacute ischemic infarcts. 3. Advanced cerebral atrophy with chronic microvascular ischemic disease. Electronically Signed   By: Jeannine Boga M.D.   On: 02/27/2015 21:43     Medical Consultants:    None.  Anti-Infectives:   Anti-infectives    None      Subjective:    Rachel [name] she relates no new complaints  Objective:    Filed Vitals:   02/28/15 2200 03/01/15 0129 03/01/15 0504 03/01/15 1005  BP: 159/72 163/82 170/69 161/65  Pulse: 73 68 57 60  Temp: 98.3 F (36.8  C) 98.3 F (36.8 C) 98.6 F (37 C) 98 F (36.7 C)  TempSrc: Oral Oral Oral Axillary  Resp: 16 15 16 14   Height:      Weight:      SpO2: 98% 98% 97% 98%   No intake or output data in the 24 hours ending 03/01/15 1053 Filed Weights   02/28/15 0115  Weight: 45.859 kg (101 lb 1.6 oz)    Exam: Gen:  NAD Cardiovascular:  RRR, No M/R/G Chest and lungs:   CTAB Abdomen:  Abdomen soft, NT/ND, + BS Extremities:  No C/E/C   Data Reviewed:    Labs: Basic Metabolic Panel:  Recent Labs Lab 02/27/15 1844 02/27/15 1855 02/28/15 1405 03/01/15 0210  NA 140 142 144 143  K 3.9 3.8 3.8 4.2  CL 106 107 108 108  CO2 24  --  27 27  GLUCOSE 155* 147* 158* 115*  BUN 38* 40* 28* 29*  CREATININE 1.37* 1.30* 1.37* 1.48*  CALCIUM 9.8  --  9.6 9.0   GFR Estimated Creatinine Clearance: 20.1 mL/min (by C-G formula based on Cr of 1.48). Liver Function Tests:  Recent Labs Lab 02/27/15 1844  AST 24  ALT 19  ALKPHOS 73  BILITOT 0.3  PROT 6.9  ALBUMIN 3.3*   No results for input(s): LIPASE, AMYLASE in the last 168 hours.  Recent Labs Lab 02/27/15 2224  AMMONIA 34   Coagulation profile  Recent Labs Lab 02/27/15 1844  INR 1.19    CBC:  Recent Labs Lab 02/27/15 1844 02/27/15 1855 02/28/15 1405 03/01/15 0210  WBC 12.1*  --  10.1 10.2  NEUTROABS 9.7*  --   --   --   HGB  14.7 16.7* 14.7 12.5  HCT 45.3 49.0* 45.9 39.4  MCV 77.8*  --  76.8* 76.5*  PLT 863*  --  774* 728*   Cardiac Enzymes: No results for input(s): CKTOTAL, CKMB, CKMBINDEX, TROPONINI in the last 168 hours. BNP (last 3 results) No results for input(s): PROBNP in the last 8760 hours. CBG: No results for input(s): GLUCAP in the last 168 hours. D-Dimer: No results for input(s): DDIMER in the last 72 hours. Hgb A1c:  Recent Labs  02/28/15 0331  HGBA1C 6.0*   Lipid Profile:  Recent Labs  02/28/15 0331  CHOL 205*  HDL 65  LDLCALC 113*  TRIG 134  CHOLHDL 3.2   Thyroid function  studies:  Recent Labs  02/27/15 2224  TSH 3.687   Anemia work up: No results for input(s): VITAMINB12, FOLATE, FERRITIN, TIBC, IRON, RETICCTPCT in the last 72 hours. Sepsis Labs:  Recent Labs Lab 02/27/15 1844 02/28/15 1405 03/01/15 0210  WBC 12.1* 10.1 10.2   Microbiology No results found for this or any previous visit (from the past 240 hour(s)).   Medications:   . cholecalciferol  5,000 Units Oral Daily  . clopidogrel  75 mg Oral Daily  . enoxaparin (LOVENOX) injection  30 mg Subcutaneous Q24H  . levothyroxine  25 mcg Oral QAC breakfast  . lisinopril  20 mg Oral Daily  . multivitamin with minerals  1 tablet Oral Daily  . pravastatin  20 mg Oral q1800   Continuous Infusions: . sodium chloride 75 mL/hr at 02/28/15 1417    Time spent: 15 min   LOS: 2 days   Charlynne Cousins  [facility] Pager [phone]  *Please refer to [email or web address], password [email or web address] to get updated schedule on who will round on this patient, as hospitalists switch teams weekly. If 7PM-7AM, please contact night-coverage at [email or web address], password [email or web address] for any overnight needs.  03/01/2015, 10:53 AM

## 2015-03-01 NOTE — Clinical Documentation Improvement (Signed)
  Internal Medicine Neurology  Can the diagnosis of acute renal insufficiency be further specified?   Acute Renal Failure  Acute Kidney Injury  Acute Tubular Necrosis  Acute on Chronic Renal Failure  Chronic Renal Failure  Other  Clinically Undetermined  Supporting Information: -- BUN/Cr: 12/6 38/1.37, 40/1.30,  12/7 28/1.37,  12/8 29/1.48 -- GFR:  12/7 34/40,  12/8 31/36  Please exercise your independent, professional judgment when responding. A specific answer is not anticipated or expected.  Thank You,  Ezekiel Ina RN [place] [phone]

## 2015-03-01 NOTE — Progress Notes (Addendum)
Physical Therapy Treatment Patient Details Name: Rachel [name] MRN: [id number] DOB: 1927/12/23 Today's Date: 03/01/2015    History of Present Illness Patient is a 81 year old female with a past medical history significant for dementia, hypertension, hypothyroidism; who presents from home with complaints of slurred speech. Subsequent MRI revealed cortical ischemic infarct within the right parietal lobe. No mass effect or hemorrhage. Additional subtle 7 mm foci of mildly increased signal intensity within the bilateral corona radiata.    PT Comments    Granddaughter/family not present. Pt alert and extremely weak bil UEs (Rt better than Lt) and LEs (Lt better than Rt). RLE no AROM except ?trace hip extension; LLE knee extension 2+. Pt with no attempt to bear weight on either leg when attempted transfer. Trunk very weak and returned to bed for optimal positioning. Appears family feels strongly re: caring for pt at home. Currently she would need to be cared for at bed level and OOB only with a lift (for pt and family safety).    Follow Up Recommendations  Home health PT;Supervision/Assistance - 24 hour     Equipment Recommendations  Other (comment) (?hoyer lift (if family agrees); transport home via ambulance)    Recommendations for Other Services       Precautions / Restrictions Precautions Precautions: Fall    Mobility  Bed Mobility Overal bed mobility: Needs Assistance;+2 for physical assistance Bed Mobility: Rolling;Sidelying to Sit;Sit to Supine Rolling: Total assist Sidelying to sit: Total assist;+2 for physical assistance   Sit to supine: Total assist;+2 for physical assistance   General bed mobility comments: pt able to reach with RUE across body towards rail, however once able to grab rail she was unable to pull/hold herself  Transfers Overall transfer level: Needs assistance   Transfers: Sit to/from Stand           General transfer comment: unable to bear weight  on either leg  Ambulation/Gait             General Gait Details: unable   Stairs            Wheelchair Mobility    Modified Rankin (Stroke Patients Only) Modified Rankin (Stroke Patients Only) Pre-Morbid Rankin Score: Moderately severe disability Modified Rankin: Severe disability     Balance     Sitting balance-Leahy Scale: Zero       Standing balance-Leahy Scale: Zero                      Cognition Arousal/Alertness: Awake/alert Behavior During Therapy: WFL for tasks assessed/performed Overall Cognitive Status: No family/caregiver present to determine baseline cognitive functioning       Memory: Decreased short-term memory (h/o dementia)              Exercises      General Comments General comments (skin integrity, edema, etc.): no family present      Pertinent Vitals/Pain Pain Assessment: No/denies pain    Home Living                      Prior Function            PT Goals (current goals can now be found in the care plan section) Acute Rehab PT Goals Patient Stated Goal: unable Time For Goal Achievement: 03/14/15 Progress towards PT goals: Not progressing toward goals - comment    Frequency  Min 2X/week    PT Plan Current plan remains appropriate (if family can manage at bed level)  Co-evaluation             End of Session   Activity Tolerance: Patient limited by fatigue Patient left: in bed;with call bell/phone within reach;with bed alarm set;with SCD's reapplied     Time: [id number] PT Time Calculation (min) (ACUTE ONLY): 18 min  Charges:  $Therapeutic Activity: 8-22 mins                    G Codes:      Damiean Lukes 27-Mar-2015, 12:04 PM Pager [phone]

## 2015-03-01 NOTE — Evaluation (Signed)
Occupational Therapy Evaluation Patient Details Name: Rachel [name] MRN: [id number] DOB: 07-Mar-1928 Today's Date: 03/01/2015    History of Present Illness Patient is a 81 year old female with a past medical history significant for dementia, hypertension, hypothyroidism; who presents from home with complaints of slurred speech. Subsequent MRI revealed cortical ischemic infarct within the right parietal lobe. No mass effect or hemorrhage. Additional subtle 7 mm foci of mildly increased signal intensity within the bilateral corona radiata.   Clinical Impression   Pt was dependent in mobility and all ADL prior to admission with exception of self feeding and washing her face and hands.  Pt presents with significant weakness and requires total assist for bed mobility, for balance at EOB and all ADL.  Pt unable to bear weight on her LEs.  Will follow to address self feeding.     Follow Up Recommendations  Home health OT;Supervision/Assistance - 24 hour    Equipment Recommendations  None recommended by OT    Recommendations for Other Services       Precautions / Restrictions Precautions Precautions: Fall Restrictions Weight Bearing Restrictions: No      Mobility Bed Mobility Overal bed mobility: Needs Assistance Bed Mobility: Supine to Sit;Sit to Supine Rolling: Total assist  Supine to sit: Total assist Sit to supine: Total assist   General bed mobility comments: attempts to use rail  Transfers            General transfer comment: unable to bear weight on either leg    Balance     Sitting balance-Leahy Scale: Zero Sitting balance - Comments: posterior and right lateral lean, trunk shortened on R and poor head control, lifted head in sitting on command, but did not maintain.  Once in chair and pillow under L arm able to hold head up     Standing balance-Leahy Scale: Zero                              ADL Overall ADL's : Needs  assistance/impaired Eating/Feeding: Maximal assistance Eating/Feeding Details (indicate cue type and reason): pt demonstrated ability to bring cup to mouth to drink, per NT, pt's family fed her earlier Grooming: Wash/dry face;Wash/dry hands;Minimal assistance;Bed level;Oral care;Total assistance                                 General ADL Comments: Total assist for bathing and dressing, incontinent of urine     Vision Additional Comments: pt reports "not seeing well out of L eye" and frequently closes it   Perception     Praxis      Pertinent Vitals/Pain Pain Assessment: No/denies pain     Hand Dominance Right   Extremity/Trunk Assessment Upper Extremity Assessment Upper Extremity Assessment: Generalized weakness   Lower Extremity Assessment Lower Extremity Assessment: Defer to PT evaluation       Communication Communication Communication: No difficulties   Cognition Arousal/Alertness: Awake/alert Behavior During Therapy: WFL for tasks assessed/performed Overall Cognitive Status: No family/caregiver present to determine baseline cognitive functioning (h/o dementia)       Memory: Decreased short-term memory (h/o dementia)             General Comments       Exercises       Shoulder Instructions      Home Living Family/patient expects to be discharged to:: Private residence Living Arrangements: Children Available Help at  Discharge: Family;Available 24 hours/day Type of Home: House Home Access: Stairs to enter [employer] of Steps: 3   Home Layout: One level     Bathroom Shower/Tub: Tub/shower unit         Home Equipment: Environmental consultant - 4 wheels;Bedside commode;Tub bench;Wheelchair - manual;Hand held shower head   Additional Comments: 2 BSC's one by bedside, one over toilet      Prior Functioning/Environment Level of Independence: Needs assistance  Gait / Transfers Assistance Needed: unclear, per MD report, pt primarily non  ambulatory ADL's / Homemaking Assistance Needed: pt able to feed herself and wash her face         OT Diagnosis: Generalized weakness;Cognitive deficits   OT Problem List: Decreased strength;Impaired balance (sitting and/or standing);Decreased coordination;Decreased cognition;Decreased safety awareness;Impaired UE functional use   OT Treatment/Interventions:      OT Goals(Current goals can be found in the care plan section) Acute Rehab OT Goals Patient Stated Goal: unable OT Goal Formulation: Patient unable to participate in goal setting Time For Goal Achievement: 03/08/15 Potential to Achieve Goals: Fair ADL Goals Pt Will Perform Eating: with set-up;sitting Pt Will Perform Grooming: with set-up;sitting (wash face and hands)  OT Frequency:     Barriers to D/C:            Co-evaluation              End of Session Nurse Communication: Need for lift equipment  Activity Tolerance: Patient tolerated treatment well Patient left: in bed;with call bell/phone within reach;with bed alarm set   Time: [id number] OT Time Calculation (min): 40 min Charges:  OT General Charges $OT Visit: 1 Procedure OT Evaluation $Initial OT Evaluation Tier I: 1 Procedure OT Treatments $Self Care/Home Management : 8-22 mins $Therapeutic Activity: 8-22 mins G-Codes:    Malka So 03/01/2015, 12:24 PM  [phone]

## 2015-03-01 NOTE — Care Management Note (Signed)
Case Management Note  Patient Details  Name: Rachel [name] MRN: [id number] Date of Birth: 09/01/1927  Subjective/Objective:                    Action/Plan: Patient was admitted with CVA. Lives at home with granddaughter.  Will follow for discharge needs pending PT/OT evals and physician orders.  Expected Discharge Date:                  Expected Discharge Plan:  [place]  In-House Referral:     Discharge planning Services     Post Acute Care Choice:    Choice offered to:     DME Arranged:    DME Agency:     HH Arranged:    [place] Agency:     Status of Service:  In process, will continue to follow  Medicare Important Message Given:    Date Medicare IM Given:    Medicare IM give by:    Date Additional Medicare IM Given:    Additional Medicare Important Message give by:     If discussed at [place] of Stay Meetings, dates discussed:    Additional CommentsRolm Baptise, RN 03/01/2015, 9:24 AM [phone]

## 2015-03-02 ENCOUNTER — Other Ambulatory Visit: Payer: Self-pay | Admitting: Physician Assistant

## 2015-03-02 DIAGNOSIS — I639 Cerebral infarction, unspecified: Secondary | ICD-10-CM

## 2015-03-02 MED ORDER — PRAVASTATIN SODIUM 20 MG PO TABS
20.0000 mg | ORAL_TABLET | Freq: Every day | ORAL | Status: DC
Start: 2015-03-02 — End: 2015-07-10

## 2015-03-02 MED ORDER — CLOPIDOGREL BISULFATE 75 MG PO TABS: 75.0000 mg | ORAL_TABLET | Freq: Every day | ORAL | Status: DC

## 2015-03-02 NOTE — Progress Notes (Signed)
Patient is discharged from room 5M16 at this time. Alert and in stable condition. IV site d/c'd as well as tele. Instructions read to patient and granddaughter with understanding verbalized. Left unit via wheelchair with all belongings at side. Patient declined EMMI consent.

## 2015-03-02 NOTE — Progress Notes (Signed)
Speech Language Pathology Treatment: Dysphagia  Patient Details Name: Rachel [name] MRN: [id number] DOB: 1927-06-22 Today's Date: 03/02/2015 Time: [id number] SLP Time Calculation (min) (ACUTE ONLY): 20 min  Assessment / Plan / Recommendation Clinical Impression  Pt observed with various consistencies including thin-mechanical soft boluses without s/s of aspiration noted, but she did exhibit left lateral pocketing which required minimal verbal cues to clear sulcus; minimal lateral oral residue noted as well, but cleared again, with minimal verbal cueing.  Continue with mechanical soft diet/thin liquids with swallowing precautions stated in POC to clear lateral sulcus during po intake and ST will f/u 1-2x more during this stay for diet tolerance.     HPI HPI: Patient is a 81 year old female with a past medical history significant for dementia, hypertension, hypothyroidism; who presents from home with complaints of slurred speech. Granddaughter provides history as patient is unable able to do so for herself that she has significant history of dementia and is confused at baseline. Patient was noted to have eaten breakfast in bed earlier this morning and while trying to reach for a cup on the nearby table this morning she fell out of bed. Family denies that she hit her head or lost consciousness. Thereafter around 2:30 PM patient was noted to have difficulty getting her words out. Symptoms appear to coming to address in different family members checked on her and noted intermittent normal speech. They checked her blood pressure was elevated at 213/103. They have became concerned that she may have had a stroke and called EMS after patient was seen drooling out of the left side of her mouth. At baseline they note that she is able to feed herself and recognize family and is left-hand dominant. They also reported associated symptoms of decreased ability to use her left hand. Family notes that patient has a history  of MS and developed a right foot drop from this years ago.      SLP Plan  Continue with current plan of care     Recommendations  Diet recommendations: Dysphagia 3 (mechanical soft);Thin liquid Liquids provided via: Cup;Straw Medication Administration: Whole meds with puree Supervision: Staff to assist with self feeding Compensations: Lingual sweep for clearance of pocketing Postural Changes and/or Swallow Maneuvers: Seated upright 90 degrees              Oral Care Recommendations: Oral care BID Follow up Recommendations: None Plan: Continue with current plan of care   Cythina Mickelsen,PAT, M.S., CCC-SLP 03/02/2015, 1:08 PM

## 2015-03-02 NOTE — Discharge Summary (Signed)
Physician Discharge Summary  Rachel [name] [id number] DOB: Feb 12, 1928 DOA: 02/27/2015  PCP: Rachell Cipro, MD  Admit date: 02/27/2015 Discharge date: 03/02/2015  Time spent: 35 minutes  Recommendations for Outpatient Follow-up:  1. Has been scheduled for a 30 day event monitor with cardiology as an outpatient. 2. We'll go home with physical therapy 3. Follow-up with neurology to 4 weeks.   Discharge Diagnoses:  Principal Problem:   Acute ischemic stroke [facility]) Active Problems:   Bradycardia   History of multiple sclerosis   Right foot drop   Hyperglycemia   Renal insufficiency   Dementia   CAD (coronary artery disease) of artery bypass graft   Hyperlipidemia   Hypothyroidism   Cerebrovascular accident (CVA) due to embolism of cerebral artery [facility])   Discharge Condition: stable  Diet recommendation: regular  Filed Weights   02/28/15 0115  Weight: 45.859 kg (101 lb 1.6 oz)    History of present illness:  81 year old female with a past medical history significant for dementia, hypertension, hypothyroidism; who presents from home with complaints of slurred speech. Granddaughter provides history as patient is unable able to do so for herself that she has significant history of dementia and is confused at baseline. Patient was noted to have eaten breakfast in bed earlier this morning and while trying to reach for a cup on the nearby table this morning she fell out of bed. Family denies that she hit her head or lost consciousness.  Hospital Course:  Acute ischemic stroke [facility]): HgbA1c 6.0, fasting lipid panel HDL > 40, LDL > 100 cont statins MRI brain without contrast showed a small right parietal infarct and bilateral corona radiate a infarct PT consult recommended home health PT. Echocardiogram unremarkable and EF of 75% extremity Doppler was negative for DVT. Carotid dopplers carotid Doppler showed less than 40% stenosis bilaterally Prophylactic therapy-Antiplatelet  med: ASA and Plavix Cardiac Monitoring no events, Cardiology to schedule a 30 day loop recorder event monitor  Hypoglycemia: A1c is 6.  History of multiple sclerosis  Chronic renal insufficiency: Baseline creatinine 1.8-1.4. Recent hospital stay.   Dementia: Stable, cont current regimen  Hypothyroidism: Cont synthroid.  Procedures:  MRI brain  CT head  CArotid doppler  ECHO  TCD    Consultations:  neurology  Discharge Exam: Filed Vitals:   03/02/15 0543 03/02/15 0923  BP: 189/88 183/89  Pulse: 64 80  Temp: 98.6 F (37 C) 98.3 F (36.8 C)  Resp: 18 20    General: A&O x3 Cardiovascular: RRR Respiratory: good air movement CTA B/L  Discharge Instructions   Discharge Instructions    Ambulatory referral to Neurology    Complete by:  As directed   Pt will follow up with Cecille Rubin at [facility] in about 1 month. Thanks.     Diet - low sodium heart healthy    Complete by:  As directed      Increase activity slowly    Complete by:  As directed           Current Discharge Medication List    START taking these medications   Details  clopidogrel (PLAVIX) 75 MG tablet Take 1 tablet (75 mg total) by mouth daily. Qty: 30 tablet, Refills: 0    pravastatin (PRAVACHOL) 20 MG tablet Take 1 tablet (20 mg total) by mouth daily at 6 PM. Qty: 30 tablet, Refills: 3      CONTINUE these medications which have NOT CHANGED   Details  Cholecalciferol (VITAMIN D3) 5000 UNITS CAPS Take 5,000 Units by  mouth daily.    levothyroxine (SYNTHROID, LEVOTHROID) 25 MCG tablet Take 25 mcg by mouth daily before breakfast.    lisinopril (PRINIVIL,ZESTRIL) 20 MG tablet Take 20 mg by mouth daily.    Multiple Vitamin (MULTIVITAMIN WITH MINERALS) TABS tablet Take 1 tablet by mouth daily.       Allergies  Allergen Reactions  . Aspirin    Follow-up Information    Follow up with Dennie Bible, NP. Schedule an appointment as soon as possible for a visit in 1 month.    Specialty:  Family Medicine   Contact information:   [address] [place] [address] [phone]        The results of significant diagnostics from this hospitalization (including imaging, microbiology, ancillary and laboratory) are listed below for reference.    Significant Diagnostic Studies: Ct Head Wo Contrast  02/27/2015  CLINICAL DATA:  Altered mental status, history of dementia. Left facial droop EXAM: CT HEAD WITHOUT CONTRAST TECHNIQUE: Contiguous axial images were obtained from the base of the skull through the vertex without contrast. COMPARISON:  None FINDINGS: Diffuse brain atrophy with chronic white matter microvascular ischemic changes throughout the cerebral hemispheres. Cerebellar atrophy as well. No acute intracranial hemorrhage, definite infarction, mass lesion, midline shift, herniation, hydrocephalus, or extra-axial fluid collection. Orbits are symmetric. Mastoids and sinuses remain clear. Limited exam because of patient positioning. IMPRESSION: Chronic atrophy and white matter microvascular ischemic changes. No acute intracranial finding by noncontrast CT. Electronically Signed   By: Jerilynn Mages.  Shick M.D.   On: 02/27/2015 19:19   Mr Brain Wo Contrast  02/27/2015  CLINICAL DATA:  Initial evaluation for acute transient altered mental status with speech difficulties. Also inability to use left hand. EXAM: MRI HEAD WITHOUT CONTRAST TECHNIQUE: Multiplanar, multiecho pulse sequences of the brain and surrounding structures were obtained without intravenous contrast. COMPARISON:  Prior CT from earlier the same day. FINDINGS: Study is degraded by motion. Diffuse prominence of the CSF containing spaces is compatible with generalized age-related cerebral atrophy, advanced in nature. Confluent T2/FLAIR hyperintensity within the periventricular and deep white matter both cerebral hemispheres most consistent with chronic small vessel ischemic disease. Tiny 7 mm focus of subtly  increased restricted diffusion present within the right basal ganglia/corona radiata (series 3, image 29), suspicious for acute or possibly early subacute ischemic infarct. Additional tiny 7 mm focus of restricted diffusion within the cortical gray matter of the right parietal lobe (series 3, image 33). No associated hemorrhage or mass effect. Subtly increased diffusion abnormality measuring approximately 7 mm within the left corona radiata may reflect an additional acute/subacute infarct (series 3, image 31). No other infarct. Major intracranial vascular flow voids are maintained. No acute intracranial hemorrhage. Small focus of susceptibility artifact within the left occipital lobe noted, likely a small chronic micro hemorrhage, most commonly from underlying chronic hypertension. Is no mass lesion, midline shift, or mass effect. Ventricular prominence related to global parenchymal volume loss present without hydrocephalus. No extra-axial fluid collection. Craniocervical junction within normal limits. Advanced multilevel degenerative spondylolysis present within the visualized upper cervical spine. Thickening of the tectorial membrane. Pituitary gland mildly prominent with convex border superiorly. No focal mass. No acute abnormality about the orbits. Mild mucosal thickening within the ethmoidal air cells. Paranasal sinuses are otherwise largely clear. No mastoid effusion. Inner ear structures within normal limits. Bone marrow signal intensity within normal limits. No scalp soft tissue abnormality. IMPRESSION: 1. 7 mm acute cortical ischemic infarct within the right parietal lobe. No mass effect  or hemorrhage. 2. Additional subtle 7 mm foci of mildly increased signal intensity within the bilateral corona radiata, suspicious for acute or early subacute ischemic infarcts. 3. Advanced cerebral atrophy with chronic microvascular ischemic disease. Electronically Signed   By: Jeannine Boga M.D.   On: 02/27/2015  21:43    Microbiology: No results found for this or any previous visit (from the past 240 hour(s)).   Labs: Basic Metabolic Panel:  Recent Labs Lab 02/27/15 1844 02/27/15 1855 02/28/15 1405 03/01/15 0210  NA 140 142 144 143  K 3.9 3.8 3.8 4.2  CL 106 107 108 108  CO2 24  --  27 27  GLUCOSE 155* 147* 158* 115*  BUN 38* 40* 28* 29*  CREATININE 1.37* 1.30* 1.37* 1.48*  CALCIUM 9.8  --  9.6 9.0   Liver Function Tests:  Recent Labs Lab 02/27/15 1844  AST 24  ALT 19  ALKPHOS 73  BILITOT 0.3  PROT 6.9  ALBUMIN 3.3*   No results for input(s): LIPASE, AMYLASE in the last 168 hours.  Recent Labs Lab 02/27/15 2224  AMMONIA 34   CBC:  Recent Labs Lab 02/27/15 1844 02/27/15 1855 02/28/15 1405 03/01/15 0210  WBC 12.1*  --  10.1 10.2  NEUTROABS 9.7*  --   --   --   HGB 14.7 16.7* 14.7 12.5  HCT 45.3 49.0* 45.9 39.4  MCV 77.8*  --  76.8* 76.5*  PLT 863*  --  774* 728*   Cardiac Enzymes: No results for input(s): CKTOTAL, CKMB, CKMBINDEX, TROPONINI in the last 168 hours. BNP: BNP (last 3 results) No results for input(s): BNP in the last 8760 hours.  ProBNP (last 3 results) No results for input(s): PROBNP in the last 8760 hours.  CBG: No results for input(s): GLUCAP in the last 168 hours.     Signed:  Charlynne Cousins  [facility] 03/02/2015, 11:10 AM

## 2015-03-02 NOTE — Care Management Note (Signed)
Case Management Note  Patient Details  Name: Shalawn Wynder MRN: [id number] Date of Birth: 1928-01-22  Subjective/Objective:                    Action/Plan: Patient discharging home with home health services. CM met with the patient and her grand daughter and provided them a list of home health agencies in the [facility] area. They selected [facility]. Miranda with [facility] notified and accepted the referral. Will update bedside RN.   Expected Discharge Date:                  Expected Discharge Plan:  [place]  In-House Referral:     Discharge planning Services  CM Consult  Post Acute Care Choice:  Home Health Choice offered to:     DME Arranged:    DME Agency:     HH Arranged:  PT, OT HH Agency:  [facility]  Status of Service:  Completed, signed off  Medicare Important Message Given:  Yes Date Medicare IM Given:    Medicare IM give by:    Date Additional Medicare IM Given:    Additional Medicare Important Message give by:     If discussed at [place] of Stay Meetings, dates discussed:    Additional Comments:  Pollie Friar, RN 03/02/2015, 3:20 PM

## 2015-03-02 NOTE — Care Management Important Message (Signed)
Important Message  Patient Details  Name: Rachel [name] MRN: [id number] Date of Birth: 1927/11/13   Medicare Important Message Given:  Yes    Katya Rolston P Jefferey Lippmann 03/02/2015, 1:44 PM

## 2015-03-04 DIAGNOSIS — R69 Illness, unspecified: Secondary | ICD-10-CM | POA: Diagnosis not present

## 2015-03-04 DIAGNOSIS — G35 Multiple sclerosis: Secondary | ICD-10-CM | POA: Diagnosis not present

## 2015-03-04 DIAGNOSIS — E039 Hypothyroidism, unspecified: Secondary | ICD-10-CM | POA: Diagnosis not present

## 2015-03-04 DIAGNOSIS — N179 Acute kidney failure, unspecified: Secondary | ICD-10-CM | POA: Diagnosis not present

## 2015-03-04 DIAGNOSIS — I69354 Hemiplegia and hemiparesis following cerebral infarction affecting left non-dominant side: Secondary | ICD-10-CM | POA: Diagnosis not present

## 2015-03-04 DIAGNOSIS — Z7901 Long term (current) use of anticoagulants: Secondary | ICD-10-CM | POA: Diagnosis not present

## 2015-03-04 DIAGNOSIS — I2510 Atherosclerotic heart disease of native coronary artery without angina pectoris: Secondary | ICD-10-CM | POA: Diagnosis not present

## 2015-03-04 DIAGNOSIS — R4781 Slurred speech: Secondary | ICD-10-CM | POA: Diagnosis not present

## 2015-03-04 DIAGNOSIS — E785 Hyperlipidemia, unspecified: Secondary | ICD-10-CM | POA: Diagnosis not present

## 2015-03-05 ENCOUNTER — Other Ambulatory Visit: Payer: Self-pay | Admitting: Physician Assistant

## 2015-03-05 ENCOUNTER — Encounter: Payer: Self-pay | Admitting: *Deleted

## 2015-03-05 DIAGNOSIS — I4891 Unspecified atrial fibrillation: Secondary | ICD-10-CM

## 2015-03-05 DIAGNOSIS — I639 Cerebral infarction, unspecified: Secondary | ICD-10-CM

## 2015-03-05 DIAGNOSIS — R001 Bradycardia, unspecified: Secondary | ICD-10-CM

## 2015-03-05 NOTE — Progress Notes (Signed)
Patient ID: Rachel [name], female   DOB: 08/16/1927, 81 y.o.   MRN: [id number] Patient did not show up for 03/05/15 11:30 AM appointment to have a 30 day cardiac event monitor applied.

## 2015-03-07 DIAGNOSIS — I69354 Hemiplegia and hemiparesis following cerebral infarction affecting left non-dominant side: Secondary | ICD-10-CM | POA: Diagnosis not present

## 2015-03-07 DIAGNOSIS — E785 Hyperlipidemia, unspecified: Secondary | ICD-10-CM | POA: Diagnosis not present

## 2015-03-07 DIAGNOSIS — R4781 Slurred speech: Secondary | ICD-10-CM | POA: Diagnosis not present

## 2015-03-07 DIAGNOSIS — G35 Multiple sclerosis: Secondary | ICD-10-CM | POA: Diagnosis not present

## 2015-03-07 DIAGNOSIS — E039 Hypothyroidism, unspecified: Secondary | ICD-10-CM | POA: Diagnosis not present

## 2015-03-07 DIAGNOSIS — R69 Illness, unspecified: Secondary | ICD-10-CM | POA: Diagnosis not present

## 2015-03-07 DIAGNOSIS — Z7901 Long term (current) use of anticoagulants: Secondary | ICD-10-CM | POA: Diagnosis not present

## 2015-03-07 DIAGNOSIS — N179 Acute kidney failure, unspecified: Secondary | ICD-10-CM | POA: Diagnosis not present

## 2015-03-07 DIAGNOSIS — I2510 Atherosclerotic heart disease of native coronary artery without angina pectoris: Secondary | ICD-10-CM | POA: Diagnosis not present

## 2015-03-09 DIAGNOSIS — R69 Illness, unspecified: Secondary | ICD-10-CM | POA: Diagnosis not present

## 2015-03-09 DIAGNOSIS — G35 Multiple sclerosis: Secondary | ICD-10-CM | POA: Diagnosis not present

## 2015-03-09 DIAGNOSIS — R4781 Slurred speech: Secondary | ICD-10-CM | POA: Diagnosis not present

## 2015-03-09 DIAGNOSIS — I2510 Atherosclerotic heart disease of native coronary artery without angina pectoris: Secondary | ICD-10-CM | POA: Diagnosis not present

## 2015-03-09 DIAGNOSIS — N179 Acute kidney failure, unspecified: Secondary | ICD-10-CM | POA: Diagnosis not present

## 2015-03-09 DIAGNOSIS — Z7901 Long term (current) use of anticoagulants: Secondary | ICD-10-CM | POA: Diagnosis not present

## 2015-03-09 DIAGNOSIS — E039 Hypothyroidism, unspecified: Secondary | ICD-10-CM | POA: Diagnosis not present

## 2015-03-09 DIAGNOSIS — E785 Hyperlipidemia, unspecified: Secondary | ICD-10-CM | POA: Diagnosis not present

## 2015-03-09 DIAGNOSIS — I69354 Hemiplegia and hemiparesis following cerebral infarction affecting left non-dominant side: Secondary | ICD-10-CM | POA: Diagnosis not present

## 2015-03-12 DIAGNOSIS — R69 Illness, unspecified: Secondary | ICD-10-CM | POA: Diagnosis not present

## 2015-03-12 DIAGNOSIS — N179 Acute kidney failure, unspecified: Secondary | ICD-10-CM | POA: Diagnosis not present

## 2015-03-12 DIAGNOSIS — Z7901 Long term (current) use of anticoagulants: Secondary | ICD-10-CM | POA: Diagnosis not present

## 2015-03-12 DIAGNOSIS — I2510 Atherosclerotic heart disease of native coronary artery without angina pectoris: Secondary | ICD-10-CM | POA: Diagnosis not present

## 2015-03-12 DIAGNOSIS — G35 Multiple sclerosis: Secondary | ICD-10-CM | POA: Diagnosis not present

## 2015-03-12 DIAGNOSIS — E785 Hyperlipidemia, unspecified: Secondary | ICD-10-CM | POA: Diagnosis not present

## 2015-03-12 DIAGNOSIS — I69354 Hemiplegia and hemiparesis following cerebral infarction affecting left non-dominant side: Secondary | ICD-10-CM | POA: Diagnosis not present

## 2015-03-12 DIAGNOSIS — R4781 Slurred speech: Secondary | ICD-10-CM | POA: Diagnosis not present

## 2015-03-12 DIAGNOSIS — E039 Hypothyroidism, unspecified: Secondary | ICD-10-CM | POA: Diagnosis not present

## 2015-03-13 DIAGNOSIS — I2510 Atherosclerotic heart disease of native coronary artery without angina pectoris: Secondary | ICD-10-CM | POA: Diagnosis not present

## 2015-03-13 DIAGNOSIS — G35 Multiple sclerosis: Secondary | ICD-10-CM | POA: Diagnosis not present

## 2015-03-13 DIAGNOSIS — I69354 Hemiplegia and hemiparesis following cerebral infarction affecting left non-dominant side: Secondary | ICD-10-CM | POA: Diagnosis not present

## 2015-03-13 DIAGNOSIS — R69 Illness, unspecified: Secondary | ICD-10-CM | POA: Diagnosis not present

## 2015-03-13 DIAGNOSIS — E785 Hyperlipidemia, unspecified: Secondary | ICD-10-CM | POA: Diagnosis not present

## 2015-03-13 DIAGNOSIS — R4781 Slurred speech: Secondary | ICD-10-CM | POA: Diagnosis not present

## 2015-03-13 DIAGNOSIS — E039 Hypothyroidism, unspecified: Secondary | ICD-10-CM | POA: Diagnosis not present

## 2015-03-13 DIAGNOSIS — Z7901 Long term (current) use of anticoagulants: Secondary | ICD-10-CM | POA: Diagnosis not present

## 2015-03-13 DIAGNOSIS — N179 Acute kidney failure, unspecified: Secondary | ICD-10-CM | POA: Diagnosis not present

## 2015-03-14 DIAGNOSIS — E039 Hypothyroidism, unspecified: Secondary | ICD-10-CM | POA: Diagnosis not present

## 2015-03-14 DIAGNOSIS — G35 Multiple sclerosis: Secondary | ICD-10-CM | POA: Diagnosis not present

## 2015-03-14 DIAGNOSIS — R4781 Slurred speech: Secondary | ICD-10-CM | POA: Diagnosis not present

## 2015-03-14 DIAGNOSIS — E785 Hyperlipidemia, unspecified: Secondary | ICD-10-CM | POA: Diagnosis not present

## 2015-03-14 DIAGNOSIS — I69354 Hemiplegia and hemiparesis following cerebral infarction affecting left non-dominant side: Secondary | ICD-10-CM | POA: Diagnosis not present

## 2015-03-14 DIAGNOSIS — Z7901 Long term (current) use of anticoagulants: Secondary | ICD-10-CM | POA: Diagnosis not present

## 2015-03-14 DIAGNOSIS — I2510 Atherosclerotic heart disease of native coronary artery without angina pectoris: Secondary | ICD-10-CM | POA: Diagnosis not present

## 2015-03-14 DIAGNOSIS — N179 Acute kidney failure, unspecified: Secondary | ICD-10-CM | POA: Diagnosis not present

## 2015-03-14 DIAGNOSIS — R69 Illness, unspecified: Secondary | ICD-10-CM | POA: Diagnosis not present

## 2015-03-15 ENCOUNTER — Ambulatory Visit (INDEPENDENT_AMBULATORY_CARE_PROVIDER_SITE_OTHER): Payer: Medicare HMO

## 2015-03-15 DIAGNOSIS — R001 Bradycardia, unspecified: Secondary | ICD-10-CM | POA: Diagnosis not present

## 2015-03-15 DIAGNOSIS — E039 Hypothyroidism, unspecified: Secondary | ICD-10-CM | POA: Diagnosis not present

## 2015-03-15 DIAGNOSIS — I639 Cerebral infarction, unspecified: Secondary | ICD-10-CM

## 2015-03-15 DIAGNOSIS — I4891 Unspecified atrial fibrillation: Secondary | ICD-10-CM

## 2015-03-15 DIAGNOSIS — I10 Essential (primary) hypertension: Secondary | ICD-10-CM | POA: Diagnosis not present

## 2015-03-20 DIAGNOSIS — R4781 Slurred speech: Secondary | ICD-10-CM | POA: Diagnosis not present

## 2015-03-20 DIAGNOSIS — Z7901 Long term (current) use of anticoagulants: Secondary | ICD-10-CM | POA: Diagnosis not present

## 2015-03-20 DIAGNOSIS — G35 Multiple sclerosis: Secondary | ICD-10-CM | POA: Diagnosis not present

## 2015-03-20 DIAGNOSIS — N179 Acute kidney failure, unspecified: Secondary | ICD-10-CM | POA: Diagnosis not present

## 2015-03-20 DIAGNOSIS — I69354 Hemiplegia and hemiparesis following cerebral infarction affecting left non-dominant side: Secondary | ICD-10-CM | POA: Diagnosis not present

## 2015-03-20 DIAGNOSIS — E039 Hypothyroidism, unspecified: Secondary | ICD-10-CM | POA: Diagnosis not present

## 2015-03-20 DIAGNOSIS — E785 Hyperlipidemia, unspecified: Secondary | ICD-10-CM | POA: Diagnosis not present

## 2015-03-20 DIAGNOSIS — R69 Illness, unspecified: Secondary | ICD-10-CM | POA: Diagnosis not present

## 2015-03-20 DIAGNOSIS — I2510 Atherosclerotic heart disease of native coronary artery without angina pectoris: Secondary | ICD-10-CM | POA: Diagnosis not present

## 2015-03-21 DIAGNOSIS — N179 Acute kidney failure, unspecified: Secondary | ICD-10-CM | POA: Diagnosis not present

## 2015-03-21 DIAGNOSIS — R4781 Slurred speech: Secondary | ICD-10-CM | POA: Diagnosis not present

## 2015-03-21 DIAGNOSIS — R69 Illness, unspecified: Secondary | ICD-10-CM | POA: Diagnosis not present

## 2015-03-21 DIAGNOSIS — I69354 Hemiplegia and hemiparesis following cerebral infarction affecting left non-dominant side: Secondary | ICD-10-CM | POA: Diagnosis not present

## 2015-03-22 DIAGNOSIS — I2510 Atherosclerotic heart disease of native coronary artery without angina pectoris: Secondary | ICD-10-CM | POA: Diagnosis not present

## 2015-03-22 DIAGNOSIS — E785 Hyperlipidemia, unspecified: Secondary | ICD-10-CM | POA: Diagnosis not present

## 2015-03-22 DIAGNOSIS — N179 Acute kidney failure, unspecified: Secondary | ICD-10-CM | POA: Diagnosis not present

## 2015-03-22 DIAGNOSIS — G35 Multiple sclerosis: Secondary | ICD-10-CM | POA: Diagnosis not present

## 2015-03-22 DIAGNOSIS — Z7901 Long term (current) use of anticoagulants: Secondary | ICD-10-CM | POA: Diagnosis not present

## 2015-03-22 DIAGNOSIS — R69 Illness, unspecified: Secondary | ICD-10-CM | POA: Diagnosis not present

## 2015-03-22 DIAGNOSIS — R4781 Slurred speech: Secondary | ICD-10-CM | POA: Diagnosis not present

## 2015-03-22 DIAGNOSIS — I69354 Hemiplegia and hemiparesis following cerebral infarction affecting left non-dominant side: Secondary | ICD-10-CM | POA: Diagnosis not present

## 2015-03-22 DIAGNOSIS — E039 Hypothyroidism, unspecified: Secondary | ICD-10-CM | POA: Diagnosis not present

## 2015-03-23 DIAGNOSIS — E785 Hyperlipidemia, unspecified: Secondary | ICD-10-CM | POA: Diagnosis not present

## 2015-03-23 DIAGNOSIS — I69354 Hemiplegia and hemiparesis following cerebral infarction affecting left non-dominant side: Secondary | ICD-10-CM | POA: Diagnosis not present

## 2015-03-23 DIAGNOSIS — R4781 Slurred speech: Secondary | ICD-10-CM | POA: Diagnosis not present

## 2015-03-23 DIAGNOSIS — E039 Hypothyroidism, unspecified: Secondary | ICD-10-CM | POA: Diagnosis not present

## 2015-03-23 DIAGNOSIS — Z7901 Long term (current) use of anticoagulants: Secondary | ICD-10-CM | POA: Diagnosis not present

## 2015-03-23 DIAGNOSIS — N179 Acute kidney failure, unspecified: Secondary | ICD-10-CM | POA: Diagnosis not present

## 2015-03-23 DIAGNOSIS — R69 Illness, unspecified: Secondary | ICD-10-CM | POA: Diagnosis not present

## 2015-03-23 DIAGNOSIS — G35 Multiple sclerosis: Secondary | ICD-10-CM | POA: Diagnosis not present

## 2015-03-23 DIAGNOSIS — I2510 Atherosclerotic heart disease of native coronary artery without angina pectoris: Secondary | ICD-10-CM | POA: Diagnosis not present

## 2015-03-27 DIAGNOSIS — I2510 Atherosclerotic heart disease of native coronary artery without angina pectoris: Secondary | ICD-10-CM | POA: Diagnosis not present

## 2015-03-27 DIAGNOSIS — Z7901 Long term (current) use of anticoagulants: Secondary | ICD-10-CM | POA: Diagnosis not present

## 2015-03-27 DIAGNOSIS — R4781 Slurred speech: Secondary | ICD-10-CM | POA: Diagnosis not present

## 2015-03-27 DIAGNOSIS — R69 Illness, unspecified: Secondary | ICD-10-CM | POA: Diagnosis not present

## 2015-03-27 DIAGNOSIS — N179 Acute kidney failure, unspecified: Secondary | ICD-10-CM | POA: Diagnosis not present

## 2015-03-27 DIAGNOSIS — G35 Multiple sclerosis: Secondary | ICD-10-CM | POA: Diagnosis not present

## 2015-03-27 DIAGNOSIS — I69354 Hemiplegia and hemiparesis following cerebral infarction affecting left non-dominant side: Secondary | ICD-10-CM | POA: Diagnosis not present

## 2015-03-27 DIAGNOSIS — E039 Hypothyroidism, unspecified: Secondary | ICD-10-CM | POA: Diagnosis not present

## 2015-03-27 DIAGNOSIS — E785 Hyperlipidemia, unspecified: Secondary | ICD-10-CM | POA: Diagnosis not present

## 2015-03-29 DIAGNOSIS — I69354 Hemiplegia and hemiparesis following cerebral infarction affecting left non-dominant side: Secondary | ICD-10-CM | POA: Diagnosis not present

## 2015-03-29 DIAGNOSIS — Z7901 Long term (current) use of anticoagulants: Secondary | ICD-10-CM | POA: Diagnosis not present

## 2015-03-29 DIAGNOSIS — R4781 Slurred speech: Secondary | ICD-10-CM | POA: Diagnosis not present

## 2015-03-29 DIAGNOSIS — R69 Illness, unspecified: Secondary | ICD-10-CM | POA: Diagnosis not present

## 2015-03-29 DIAGNOSIS — N179 Acute kidney failure, unspecified: Secondary | ICD-10-CM | POA: Diagnosis not present

## 2015-03-29 DIAGNOSIS — G35 Multiple sclerosis: Secondary | ICD-10-CM | POA: Diagnosis not present

## 2015-03-29 DIAGNOSIS — E039 Hypothyroidism, unspecified: Secondary | ICD-10-CM | POA: Diagnosis not present

## 2015-03-29 DIAGNOSIS — E785 Hyperlipidemia, unspecified: Secondary | ICD-10-CM | POA: Diagnosis not present

## 2015-03-29 DIAGNOSIS — I2510 Atherosclerotic heart disease of native coronary artery without angina pectoris: Secondary | ICD-10-CM | POA: Diagnosis not present

## 2015-03-30 DIAGNOSIS — N179 Acute kidney failure, unspecified: Secondary | ICD-10-CM | POA: Diagnosis not present

## 2015-03-30 DIAGNOSIS — R69 Illness, unspecified: Secondary | ICD-10-CM | POA: Diagnosis not present

## 2015-03-30 DIAGNOSIS — G35 Multiple sclerosis: Secondary | ICD-10-CM | POA: Diagnosis not present

## 2015-03-30 DIAGNOSIS — R4781 Slurred speech: Secondary | ICD-10-CM | POA: Diagnosis not present

## 2015-03-30 DIAGNOSIS — I2510 Atherosclerotic heart disease of native coronary artery without angina pectoris: Secondary | ICD-10-CM | POA: Diagnosis not present

## 2015-03-30 DIAGNOSIS — Z7901 Long term (current) use of anticoagulants: Secondary | ICD-10-CM | POA: Diagnosis not present

## 2015-03-30 DIAGNOSIS — I69354 Hemiplegia and hemiparesis following cerebral infarction affecting left non-dominant side: Secondary | ICD-10-CM | POA: Diagnosis not present

## 2015-03-30 DIAGNOSIS — E039 Hypothyroidism, unspecified: Secondary | ICD-10-CM | POA: Diagnosis not present

## 2015-03-30 DIAGNOSIS — E785 Hyperlipidemia, unspecified: Secondary | ICD-10-CM | POA: Diagnosis not present

## 2015-04-02 DIAGNOSIS — S31829A Unspecified open wound of left buttock, initial encounter: Secondary | ICD-10-CM | POA: Diagnosis not present

## 2015-04-03 DIAGNOSIS — R69 Illness, unspecified: Secondary | ICD-10-CM | POA: Diagnosis not present

## 2015-04-03 DIAGNOSIS — I69354 Hemiplegia and hemiparesis following cerebral infarction affecting left non-dominant side: Secondary | ICD-10-CM | POA: Diagnosis not present

## 2015-04-03 DIAGNOSIS — R4781 Slurred speech: Secondary | ICD-10-CM | POA: Diagnosis not present

## 2015-04-03 DIAGNOSIS — E785 Hyperlipidemia, unspecified: Secondary | ICD-10-CM | POA: Diagnosis not present

## 2015-04-03 DIAGNOSIS — E039 Hypothyroidism, unspecified: Secondary | ICD-10-CM | POA: Diagnosis not present

## 2015-04-03 DIAGNOSIS — I2510 Atherosclerotic heart disease of native coronary artery without angina pectoris: Secondary | ICD-10-CM | POA: Diagnosis not present

## 2015-04-03 DIAGNOSIS — G35 Multiple sclerosis: Secondary | ICD-10-CM | POA: Diagnosis not present

## 2015-04-03 DIAGNOSIS — Z7901 Long term (current) use of anticoagulants: Secondary | ICD-10-CM | POA: Diagnosis not present

## 2015-04-03 DIAGNOSIS — N179 Acute kidney failure, unspecified: Secondary | ICD-10-CM | POA: Diagnosis not present

## 2015-04-05 DIAGNOSIS — G35 Multiple sclerosis: Secondary | ICD-10-CM | POA: Diagnosis not present

## 2015-04-05 DIAGNOSIS — E785 Hyperlipidemia, unspecified: Secondary | ICD-10-CM | POA: Diagnosis not present

## 2015-04-05 DIAGNOSIS — N179 Acute kidney failure, unspecified: Secondary | ICD-10-CM | POA: Diagnosis not present

## 2015-04-05 DIAGNOSIS — E039 Hypothyroidism, unspecified: Secondary | ICD-10-CM | POA: Diagnosis not present

## 2015-04-05 DIAGNOSIS — Z7901 Long term (current) use of anticoagulants: Secondary | ICD-10-CM | POA: Diagnosis not present

## 2015-04-05 DIAGNOSIS — R69 Illness, unspecified: Secondary | ICD-10-CM | POA: Diagnosis not present

## 2015-04-05 DIAGNOSIS — R4781 Slurred speech: Secondary | ICD-10-CM | POA: Diagnosis not present

## 2015-04-05 DIAGNOSIS — I69354 Hemiplegia and hemiparesis following cerebral infarction affecting left non-dominant side: Secondary | ICD-10-CM | POA: Diagnosis not present

## 2015-04-05 DIAGNOSIS — I2510 Atherosclerotic heart disease of native coronary artery without angina pectoris: Secondary | ICD-10-CM | POA: Diagnosis not present

## 2015-04-06 DIAGNOSIS — E039 Hypothyroidism, unspecified: Secondary | ICD-10-CM | POA: Diagnosis not present

## 2015-04-06 DIAGNOSIS — G35 Multiple sclerosis: Secondary | ICD-10-CM | POA: Diagnosis not present

## 2015-04-06 DIAGNOSIS — I2510 Atherosclerotic heart disease of native coronary artery without angina pectoris: Secondary | ICD-10-CM | POA: Diagnosis not present

## 2015-04-06 DIAGNOSIS — R69 Illness, unspecified: Secondary | ICD-10-CM | POA: Diagnosis not present

## 2015-04-06 DIAGNOSIS — E785 Hyperlipidemia, unspecified: Secondary | ICD-10-CM | POA: Diagnosis not present

## 2015-04-06 DIAGNOSIS — N179 Acute kidney failure, unspecified: Secondary | ICD-10-CM | POA: Diagnosis not present

## 2015-04-06 DIAGNOSIS — R4781 Slurred speech: Secondary | ICD-10-CM | POA: Diagnosis not present

## 2015-04-06 DIAGNOSIS — I69354 Hemiplegia and hemiparesis following cerebral infarction affecting left non-dominant side: Secondary | ICD-10-CM | POA: Diagnosis not present

## 2015-04-06 DIAGNOSIS — Z7901 Long term (current) use of anticoagulants: Secondary | ICD-10-CM | POA: Diagnosis not present

## 2015-04-10 DIAGNOSIS — E785 Hyperlipidemia, unspecified: Secondary | ICD-10-CM | POA: Diagnosis not present

## 2015-04-10 DIAGNOSIS — I2510 Atherosclerotic heart disease of native coronary artery without angina pectoris: Secondary | ICD-10-CM | POA: Diagnosis not present

## 2015-04-10 DIAGNOSIS — R69 Illness, unspecified: Secondary | ICD-10-CM | POA: Diagnosis not present

## 2015-04-10 DIAGNOSIS — I69354 Hemiplegia and hemiparesis following cerebral infarction affecting left non-dominant side: Secondary | ICD-10-CM | POA: Diagnosis not present

## 2015-04-10 DIAGNOSIS — E039 Hypothyroidism, unspecified: Secondary | ICD-10-CM | POA: Diagnosis not present

## 2015-04-10 DIAGNOSIS — R4781 Slurred speech: Secondary | ICD-10-CM | POA: Diagnosis not present

## 2015-04-10 DIAGNOSIS — G35 Multiple sclerosis: Secondary | ICD-10-CM | POA: Diagnosis not present

## 2015-04-10 DIAGNOSIS — Z7901 Long term (current) use of anticoagulants: Secondary | ICD-10-CM | POA: Diagnosis not present

## 2015-04-10 DIAGNOSIS — N179 Acute kidney failure, unspecified: Secondary | ICD-10-CM | POA: Diagnosis not present

## 2015-04-13 ENCOUNTER — Emergency Department (HOSPITAL_COMMUNITY): Payer: Medicare HMO

## 2015-04-13 ENCOUNTER — Encounter (HOSPITAL_COMMUNITY): Payer: Self-pay | Admitting: *Deleted

## 2015-04-13 ENCOUNTER — Inpatient Hospital Stay (HOSPITAL_COMMUNITY)
Admission: EM | Admit: 2015-04-13 | Discharge: 2015-04-17 | DRG: 602 | Disposition: A | Discharge status: Home Health | Payer: Medicare HMO | Attending: Internal Medicine | Admitting: Internal Medicine

## 2015-04-13 DIAGNOSIS — L8990 Pressure ulcer of unspecified site, unspecified stage: Secondary | ICD-10-CM | POA: Diagnosis present

## 2015-04-13 DIAGNOSIS — R7989 Other specified abnormal findings of blood chemistry: Secondary | ICD-10-CM | POA: Diagnosis present

## 2015-04-13 DIAGNOSIS — L03116 Cellulitis of left lower limb: Principal | ICD-10-CM | POA: Diagnosis present

## 2015-04-13 DIAGNOSIS — D75839 Thrombocytosis, unspecified: Secondary | ICD-10-CM

## 2015-04-13 DIAGNOSIS — L03119 Cellulitis of unspecified part of limb: Secondary | ICD-10-CM

## 2015-04-13 DIAGNOSIS — I2510 Atherosclerotic heart disease of native coronary artery without angina pectoris: Secondary | ICD-10-CM | POA: Diagnosis not present

## 2015-04-13 DIAGNOSIS — L02419 Cutaneous abscess of limb, unspecified: Secondary | ICD-10-CM | POA: Diagnosis present

## 2015-04-13 DIAGNOSIS — S99912A Unspecified injury of left ankle, initial encounter: Secondary | ICD-10-CM | POA: Diagnosis not present

## 2015-04-13 DIAGNOSIS — F0390 Unspecified dementia without behavioral disturbance: Secondary | ICD-10-CM | POA: Diagnosis present

## 2015-04-13 DIAGNOSIS — Z66 Do not resuscitate: Secondary | ICD-10-CM | POA: Diagnosis present

## 2015-04-13 DIAGNOSIS — N170 Acute kidney failure with tubular necrosis: Secondary | ICD-10-CM | POA: Diagnosis present

## 2015-04-13 DIAGNOSIS — I129 Hypertensive chronic kidney disease with stage 1 through stage 4 chronic kidney disease, or unspecified chronic kidney disease: Secondary | ICD-10-CM | POA: Diagnosis present

## 2015-04-13 DIAGNOSIS — D473 Essential (hemorrhagic) thrombocythemia: Secondary | ICD-10-CM | POA: Diagnosis present

## 2015-04-13 DIAGNOSIS — Z515 Encounter for palliative care: Secondary | ICD-10-CM | POA: Insufficient documentation

## 2015-04-13 DIAGNOSIS — N189 Chronic kidney disease, unspecified: Secondary | ICD-10-CM

## 2015-04-13 DIAGNOSIS — N179 Acute kidney failure, unspecified: Secondary | ICD-10-CM

## 2015-04-13 DIAGNOSIS — D7589 Other specified diseases of blood and blood-forming organs: Secondary | ICD-10-CM | POA: Diagnosis present

## 2015-04-13 DIAGNOSIS — N182 Chronic kidney disease, stage 2 (mild): Secondary | ICD-10-CM | POA: Diagnosis present

## 2015-04-13 DIAGNOSIS — E039 Hypothyroidism, unspecified: Secondary | ICD-10-CM | POA: Diagnosis present

## 2015-04-13 DIAGNOSIS — R69 Illness, unspecified: Secondary | ICD-10-CM | POA: Diagnosis not present

## 2015-04-13 DIAGNOSIS — N183 Chronic kidney disease, stage 3 unspecified: Secondary | ICD-10-CM | POA: Diagnosis present

## 2015-04-13 DIAGNOSIS — Z79899 Other long term (current) drug therapy: Secondary | ICD-10-CM | POA: Diagnosis not present

## 2015-04-13 DIAGNOSIS — I69354 Hemiplegia and hemiparesis following cerebral infarction affecting left non-dominant side: Secondary | ICD-10-CM | POA: Diagnosis not present

## 2015-04-13 DIAGNOSIS — M79605 Pain in left leg: Secondary | ICD-10-CM | POA: Diagnosis not present

## 2015-04-13 DIAGNOSIS — Z8673 Personal history of transient ischemic attack (TIA), and cerebral infarction without residual deficits: Secondary | ICD-10-CM | POA: Diagnosis not present

## 2015-04-13 DIAGNOSIS — I739 Peripheral vascular disease, unspecified: Secondary | ICD-10-CM | POA: Diagnosis present

## 2015-04-13 DIAGNOSIS — Z7189 Other specified counseling: Secondary | ICD-10-CM | POA: Insufficient documentation

## 2015-04-13 DIAGNOSIS — Z886 Allergy status to analgesic agent status: Secondary | ICD-10-CM | POA: Diagnosis not present

## 2015-04-13 DIAGNOSIS — M25572 Pain in left ankle and joints of left foot: Secondary | ICD-10-CM | POA: Diagnosis not present

## 2015-04-13 DIAGNOSIS — R4781 Slurred speech: Secondary | ICD-10-CM | POA: Diagnosis not present

## 2015-04-13 DIAGNOSIS — R52 Pain, unspecified: Secondary | ICD-10-CM | POA: Diagnosis not present

## 2015-04-13 DIAGNOSIS — Z7901 Long term (current) use of anticoagulants: Secondary | ICD-10-CM | POA: Diagnosis not present

## 2015-04-13 DIAGNOSIS — E46 Unspecified protein-calorie malnutrition: Secondary | ICD-10-CM | POA: Diagnosis not present

## 2015-04-13 DIAGNOSIS — L89614 Pressure ulcer of right heel, stage 4: Secondary | ICD-10-CM | POA: Diagnosis present

## 2015-04-13 DIAGNOSIS — G35 Multiple sclerosis: Secondary | ICD-10-CM | POA: Diagnosis not present

## 2015-04-13 DIAGNOSIS — R609 Edema, unspecified: Secondary | ICD-10-CM

## 2015-04-13 DIAGNOSIS — E43 Unspecified severe protein-calorie malnutrition: Secondary | ICD-10-CM | POA: Diagnosis present

## 2015-04-13 DIAGNOSIS — I6930 Unspecified sequelae of cerebral infarction: Secondary | ICD-10-CM

## 2015-04-13 DIAGNOSIS — M7989 Other specified soft tissue disorders: Secondary | ICD-10-CM | POA: Diagnosis not present

## 2015-04-13 DIAGNOSIS — E785 Hyperlipidemia, unspecified: Secondary | ICD-10-CM | POA: Diagnosis not present

## 2015-04-13 DIAGNOSIS — Z681 Body mass index (BMI) 19 or less, adult: Secondary | ICD-10-CM | POA: Diagnosis not present

## 2015-04-13 DIAGNOSIS — L0390 Cellulitis, unspecified: Secondary | ICD-10-CM | POA: Diagnosis present

## 2015-04-13 HISTORY — DX: Cerebral infarction, unspecified: I63.9

## 2015-04-13 HISTORY — DX: Thrombocytosis, unspecified: D75.839

## 2015-04-13 LAB — CBC WITH DIFFERENTIAL/PLATELET
BASOS ABS: 0.1 10*3/uL (ref 0.0–0.1)
Basophils Relative: 0 %
EOS PCT: 1 %
Eosinophils Absolute: 0.2 10*3/uL (ref 0.0–0.7)
HEMATOCRIT: 40.9 % (ref 36.0–46.0)
Hemoglobin: 13.9 g/dL (ref 12.0–15.0)
LYMPHS ABS: 1.6 10*3/uL (ref 0.7–4.0)
LYMPHS PCT: 9 %
MCH: 25.8 pg — ABNORMAL LOW (ref 26.0–34.0)
MCHC: 34 g/dL (ref 30.0–36.0)
MCV: 76 fL — AB (ref 78.0–100.0)
MONO ABS: 1.7 10*3/uL — AB (ref 0.1–1.0)
MONOS PCT: 10 %
NEUTROS ABS: 13.4 10*3/uL — AB (ref 1.7–7.7)
Neutrophils Relative %: 80 %
PLATELETS: 806 10*3/uL — AB (ref 150–400)
RBC: 5.38 MIL/uL — ABNORMAL HIGH (ref 3.87–5.11)
RDW: 16.7 % — AB (ref 11.5–15.5)
WBC: 17 10*3/uL — ABNORMAL HIGH (ref 4.0–10.5)

## 2015-04-13 LAB — COMPREHENSIVE METABOLIC PANEL
ALT: 18 U/L (ref 14–54)
AST: 25 U/L (ref 15–41)
Albumin: 3.3 g/dL — ABNORMAL LOW (ref 3.5–5.0)
Alkaline Phosphatase: 80 U/L (ref 38–126)
Anion gap: 11 (ref 5–15)
BILIRUBIN TOTAL: 0.4 mg/dL (ref 0.3–1.2)
BUN: 47 mg/dL — AB (ref 6–20)
CO2: 23 mmol/L (ref 22–32)
CREATININE: 1.7 mg/dL — AB (ref 0.44–1.00)
Calcium: 9.8 mg/dL (ref 8.9–10.3)
Chloride: 109 mmol/L (ref 101–111)
GFR, EST AFRICAN AMERICAN: 30 mL/min — AB (ref >60)
GFR, EST NON AFRICAN AMERICAN: 26 mL/min — AB (ref >60)
Glucose, Bld: 124 mg/dL — ABNORMAL HIGH (ref 65–99)
POTASSIUM: 4.7 mmol/L (ref 3.5–5.1)
Sodium: 143 mmol/L (ref 135–145)
TOTAL PROTEIN: 7.4 g/dL (ref 6.5–8.1)

## 2015-04-13 LAB — PROTIME-INR
INR: 1.2 (ref 0.00–1.49)
PROTHROMBIN TIME: 15.4 s — AB (ref 11.6–15.2)

## 2015-04-13 LAB — APTT: aPTT: 32 seconds (ref 24–37)

## 2015-04-13 LAB — LACTIC ACID, PLASMA: Lactic Acid, Venous: 1.1 mmol/L (ref 0.5–2.0)

## 2015-04-13 MED ORDER — VITAMIN D 1000 UNITS PO TABS
5000.0000 [IU] | ORAL_TABLET | Freq: Every day | ORAL | Status: DC
Start: 2015-04-14 — End: 2015-04-17
  Administered 2015-04-14 – 2015-04-17 (×4): 5000 [IU] via ORAL
  Filled 2015-04-13 (×4): qty 5

## 2015-04-13 MED ORDER — LEVOTHYROXINE SODIUM 25 MCG PO TABS
25.0000 ug | ORAL_TABLET | Freq: Every day | ORAL | Status: DC
Start: 2015-04-14 — End: 2015-04-17
  Administered 2015-04-14 – 2015-04-17 (×4): 25 ug via ORAL
  Filled 2015-04-13 (×5): qty 1

## 2015-04-13 MED ORDER — VANCOMYCIN HCL IN DEXTROSE 1-5 GM/200ML-% IV SOLN
1000.0000 mg | Freq: Once | INTRAVENOUS | Status: AC
  Administered 2015-04-13: 1000 mg via INTRAVENOUS
  Filled 2015-04-13 (×2): qty 200

## 2015-04-13 MED ORDER — PRAVASTATIN SODIUM 20 MG PO TABS
20.0000 mg | ORAL_TABLET | Freq: Every day | ORAL | Status: DC
  Administered 2015-04-14 – 2015-04-16 (×3): 20 mg via ORAL
  Filled 2015-04-13 (×4): qty 1

## 2015-04-13 MED ORDER — SENNOSIDES-DOCUSATE SODIUM 8.6-50 MG PO TABS: 1.0000 | ORAL_TABLET | Freq: Every evening | ORAL | Status: DC | PRN

## 2015-04-13 MED ORDER — SODIUM CHLORIDE 0.9 % IV SOLN
500.0000 mg | INTRAVENOUS | Status: DC
  Administered 2015-04-16: 500 mg via INTRAVENOUS
  Filled 2015-04-13 (×2): qty 500

## 2015-04-13 MED ORDER — BISACODYL 5 MG PO TBEC: 5.0000 mg | DELAYED_RELEASE_TABLET | Freq: Every day | ORAL | Status: DC | PRN

## 2015-04-13 MED ORDER — SODIUM CHLORIDE 0.9 % IV SOLN
INTRAVENOUS | Status: DC
  Administered 2015-04-13: 75 mL/h via INTRAVENOUS

## 2015-04-13 MED ORDER — CLOPIDOGREL BISULFATE 75 MG PO TABS
75.0000 mg | ORAL_TABLET | Freq: Every day | ORAL | Status: DC
  Administered 2015-04-13 – 2015-04-17 (×5): 75 mg via ORAL
  Filled 2015-04-13 (×5): qty 1

## 2015-04-13 MED ORDER — VITAMINS A & D EX OINT
1.0000 "application " | TOPICAL_OINTMENT | CUTANEOUS | Status: DC | PRN
  Filled 2015-04-13: qty 5

## 2015-04-13 MED ORDER — ADULT MULTIVITAMIN W/MINERALS CH
1.0000 | ORAL_TABLET | Freq: Every day | ORAL | Status: DC
  Administered 2015-04-14 – 2015-04-17 (×4): 1 via ORAL
  Filled 2015-04-13 (×4): qty 1

## 2015-04-13 MED ORDER — CEFAZOLIN SODIUM 1-5 GM-% IV SOLN
1.0000 g | Freq: Three times a day (TID) | INTRAVENOUS | Status: DC
  Filled 2015-04-13 (×2): qty 50

## 2015-04-13 MED ORDER — ACETAMINOPHEN 325 MG PO TABS: 650.0000 mg | ORAL_TABLET | Freq: Four times a day (QID) | ORAL | Status: DC | PRN

## 2015-04-13 MED ORDER — ADULT MULTIVITAMIN W/MINERALS CH
1.0000 | ORAL_TABLET | Freq: Every day | ORAL | Status: DC
  Filled 2015-04-13: qty 1

## 2015-04-13 MED ORDER — MORPHINE SULFATE (PF) 2 MG/ML IV SOLN: 1.0000 mg | INTRAVENOUS | Status: DC | PRN

## 2015-04-13 MED ORDER — ONDANSETRON HCL 4 MG PO TABS: 4.0000 mg | ORAL_TABLET | Freq: Four times a day (QID) | ORAL | Status: DC | PRN

## 2015-04-13 MED ORDER — ACETAMINOPHEN 650 MG RE SUPP: 650.0000 mg | Freq: Four times a day (QID) | RECTAL | Status: DC | PRN

## 2015-04-13 MED ORDER — ONDANSETRON HCL 4 MG/2ML IJ SOLN: 4.0000 mg | Freq: Four times a day (QID) | INTRAMUSCULAR | Status: DC | PRN

## 2015-04-13 MED ORDER — HYDROCODONE-ACETAMINOPHEN 5-325 MG PO TABS: 1.0000 | ORAL_TABLET | ORAL | Status: DC | PRN

## 2015-04-13 MED ORDER — HEPARIN SODIUM (PORCINE) 5000 UNIT/ML IJ SOLN
5000.0000 [IU] | Freq: Three times a day (TID) | INTRAMUSCULAR | Status: DC
  Administered 2015-04-13 – 2015-04-17 (×12): 5000 [IU] via SUBCUTANEOUS
  Filled 2015-04-13 (×14): qty 1

## 2015-04-13 NOTE — ED Notes (Signed)
Pt's grand-daughter reports noticing a small "bump" in her LLE x 3 days ago and noticed significant swelling in her LLE and L ankle today.  She states that she called pt's doctor and was instructed to take pt to the ED to be evaluated.  She denies any injury at this time.  Pt has L side hemiparesis from CVA.

## 2015-04-13 NOTE — Progress Notes (Signed)
ANTIBIOTIC CONSULT NOTE - INITIAL  Pharmacy Consult for Vancomycin Indication: cellulitis  Allergies  Allergen Reactions  . Aspirin     Unknown reaction.     Patient Measurements: Height: 64 inches Total Body Weight: 46 kg (02/28/15)   Vital Signs: Temp: 98.8 F (37.1 C) (01/20 2055) Temp Source: Oral (01/20 2055) BP: 134/57 mmHg (01/20 2055) Pulse Rate: 89 (01/20 2055) Intake/Output from previous day:   Intake/Output from this shift:    Labs:  Recent Labs  04/13/15 2006  WBC 17.0*  HGB 13.9  PLT 806*  CREATININE 1.70*   CrCl cannot be calculated (Unknown ideal weight.). No results for input(s): VANCOTROUGH, VANCOPEAK, VANCORANDOM, GENTTROUGH, GENTPEAK, GENTRANDOM, TOBRATROUGH, TOBRAPEAK, TOBRARND, AMIKACINPEAK, AMIKACINTROU, AMIKACIN in the last 72 hours.   Microbiology: No results found for this or any previous visit (from the past 720 hour(s)).  Medical History: Past Medical History  Diagnosis Date  . Dementia   . Hypertension   . Stroke [facility])     Medications:  Scheduled:  . [START ON 04/14/2015] cholecalciferol  5,000 Units Oral Daily  . clopidogrel  75 mg Oral Daily  . heparin  5,000 Units Subcutaneous 3 times per day  . [START ON 04/14/2015] levothyroxine  25 mcg Oral QAC breakfast  . [START ON 04/14/2015] multivitamin with minerals  1 tablet Oral Daily  . [START ON 04/14/2015] pravastatin  20 mg Oral q1800  . vancomycin  1,000 mg Intravenous Once   Infusions:  . sodium chloride     PRN: acetaminophen **OR** acetaminophen, bisacodyl, HYDROcodone-acetaminophen, morphine injection, ondansetron **OR** ondansetron (ZOFRAN) IV, senna-docusate, vitamin A & D Assessment: 87yoF with PMH of HTN, hypothyroidism, dmenetia and ischemic CVA presenting to the ER with left lower leg swelling and pain. There is erythema throughout the left calf and foot. Vancomycin is to be started for cellulitis.  Goal of Therapy:  Vancomycin trough level 10-15 mcg/ml  Plan:   Vancomycin 1gm IV x 1 ordered in the ER. then Vancomycin 500mg  IV Q48h starting 1/22 Will f/u Scr and Cx.   Garnet Sierras 04/13/2015,10:19 PM

## 2015-04-13 NOTE — H&P (Signed)
[facility] History and Physical  Rachel [name] [id number] DOB: [birth year] DOA: 04/13/2015  Referring physician: ED physician PCP: Rachel Cipro, MD  Specialists:   Chief Complaint:  Left leg pain and swelling  HPI: Rachel [name] is a 82 y.o. female with PMH of hypertension, hypothyroidism, dementia, and ischemic CVA last month with resulting left-sided deficits who presents to the ED with left lower leg swelling and pain. The patient is cared for at home by her granddaughter who had noticed a small bump on the lateral left calf approximately 3 days ago. This morning, the patient's granddaughter noticed swelling and erythema throughout the left calf and foot with interval increase in the size of a the original bump. There was no known trauma and the patient has not had similar problems previously. The patient was discharged from the hospital on 03/02/2015 following management of acute right parietal infarct and has been essentially bedbound and nonverbal. Per the patient's granddaughter, patient has maintained her normal appetite and activity level, continuing to help with her feedings. There were no fevers or sweats appreciated by the caretaker and the patient is likely unable to verbalize any complaints herself. There is been no diarrhea or vomiting. Patient had been stable at home after returning from the hospital up to this point.   In ED, patient was found to be afebrile, saturating well on room air, and with vital signs stable. Initial blood work is notable for an elevated serum creatinine of 1.7, up from apparent baseline of 1.3. CBC notable for a leukocytosis to 17,000 and a thrombocytosis to 800,000. Patient was noted to have left lower extremity swelling and erythema and radiographs were obtained. X-ray demonstrates soft tissue edema without evidence for acute fracture or bony involvement. The hospitalists were consulted at this time and the patient will be admitted for ongoing  evaluation and management of suspected purulent cellulitis of the left lower leg.  Where does patient live?   At home     Can patient participate in ADLs?   Barely       Review of Systems:  Unable to obtain secondary to patient's clinical condition with advanced dementia and non-verbal state.    Allergy:  Allergies  Allergen Reactions  . Aspirin     Unknown reaction.     Past Medical History  Diagnosis Date  . Dementia   . Hypertension   . Stroke [facility])     History reviewed. No pertinent past surgical history.  Social History:  reports that she has never smoked. She has never used smokeless tobacco. She reports that she does not drink alcohol or use illicit drugs.  Family History:  Family History  Problem Relation Age of Onset  . Obesity Other      Prior to Admission medications   Medication Sig Start Date End Date Taking? Authorizing Provider  Cholecalciferol (VITAMIN D3) 5000 UNITS CAPS Take 5,000 Units by mouth daily.   Yes Historical Provider, MD  levothyroxine (SYNTHROID, LEVOTHROID) 25 MCG tablet Take 25 mcg by mouth daily before breakfast.   Yes Historical Provider, MD  lisinopril (PRINIVIL,ZESTRIL) 20 MG tablet Take 20 mg by mouth daily.   Yes Historical Provider, MD  Multiple Vitamin (MULTIVITAMIN WITH MINERALS) TABS tablet Take 1 tablet by mouth daily.   Yes Historical Provider, MD  pravastatin (PRAVACHOL) 20 MG tablet Take 1 tablet (20 mg total) by mouth daily at 6 PM. 03/02/15  Yes Charlynne Cousins, MD  Vitamins A & D (VITAMIN A & D) ointment Apply 1  application topically as needed (with every adult brief change.).   Yes Historical Provider, MD  clopidogrel (PLAVIX) 75 MG tablet Take 1 tablet (75 mg total) by mouth daily. Patient not taking: Reported on 04/13/2015 03/02/15   Charlynne Cousins, MD    Physical Exam: Filed Vitals:   04/13/15 1644 04/13/15 2055  BP: 175/85 134/57  Pulse: 90 89  Temp: 98.9 F (37.2 C) 98.8 F (37.1 C)  TempSrc: Oral Oral   Resp: 20 17  SpO2: 98% 96%   General: Not in acute distress, cachectic HEENT:       Eyes: PERRL, EOMI, no scleral icterus or conjunctival pallor.       ENT: No discharge from the ears or nose, no pharyngeal ulcers, petechiae or exudate, no tonsillar enlargement.        Neck: No JVD, no bruit, no appreciable mass Heme: No cervical adenopathy, no pallor Cardiac: Rate ~80 and regular, grade III crescendo-decrescendo systolic murmur at upper LSB, No gallops or rubs. Pulm: Good air movement bilaterally. No rales, wheezing, rhonchi or rubs. Abd: Soft, nondistended, nontender, no rebound pain or gaurding, no mass or organomegaly, BS present. Ext:  Right heel with unstageable pressure wound.  Left lateral leg with ~7x5 cm focus of fluctuance on background of erythema and swelling; right foot edematous.  Musculoskeletal: Muscle atrophy throughout; left leg findings above, aside from that, no other hot, swollen, red joints.     Skin:  LE wounds described above, no other significant rash or wound identified  Neuro: Alert, makes eye-contact, non-verbal, not following commands, PERRL, moving all extremities, Babinski downgoing b/l.   Psych: Patient is not overtly psychotic, though she is non-verbal.  Labs on Admission:  Basic Metabolic Panel:  Recent Labs Lab 04/13/15 2006  NA 143  K 4.7  CL 109  CO2 23  GLUCOSE 124*  BUN 47*  CREATININE 1.70*  CALCIUM 9.8   Liver Function Tests:  Recent Labs Lab 04/13/15 2006  AST 25  ALT 18  ALKPHOS 80  BILITOT 0.4  PROT 7.4  ALBUMIN 3.3*   No results for input(s): LIPASE, AMYLASE in the last 168 hours. No results for input(s): AMMONIA in the last 168 hours. CBC:  Recent Labs Lab 04/13/15 2006  WBC 17.0*  NEUTROABS 13.4*  HGB 13.9  HCT 40.9  MCV 76.0*  PLT 806*   Cardiac Enzymes: No results for input(s): CKTOTAL, CKMB, CKMBINDEX, TROPONINI in the last 168 hours.  BNP (last 3 results) No results for input(s): BNP in the last 8760  hours.  ProBNP (last 3 results) No results for input(s): PROBNP in the last 8760 hours.  CBG: No results for input(s): GLUCAP in the last 168 hours.  Radiological Exams on Admission: Dg Tibia/fibula Left  04/13/2015  CLINICAL DATA:  Recent fall. Left leg pain and swelling. Initial encounter. EXAM: LEFT TIBIA AND FIBULA - 2 VIEW COMPARISON:  None. FINDINGS: There is no evidence of fracture or other focal bone lesions. Generalized osteopenia noted. Osteoarthritis seen involving lateral compartment knee. Extensive peripheral vascular calcification noted. Soft tissue swelling is seen along the lateral aspect of the fibular shaft. IMPRESSION: Lateral soft tissue swelling.  No evidence of fracture. Electronically Signed   By: Earle Gell M.D.   On: 04/13/2015 17:30   Dg Ankle Complete Left  04/13/2015  CLINICAL DATA:  Recent fall. Left ankle injury and pain. Initial encounter. EXAM: LEFT ANKLE COMPLETE - 3+ VIEW COMPARISON:  None. FINDINGS: There is no evidence of fracture, dislocation, or  joint effusion. There is no evidence of arthropathy or other focal bone abnormality. Well corticated ossific densities are seen along the talar neck, likely due to old avulsion injuries or chronic trauma. Generalized osteopenia noted. Lateral soft tissue swelling also demonstrated. IMPRESSION: Lateral soft tissue swelling. No evidence of acute fracture or dislocation. Electronically Signed   By: Earle Gell M.D.   On: 04/13/2015 17:32    EKG:  Not done in ED, will obtain as appropriate   Assessment/Plan  1. Purulent cellulitis, left leg  - Fluctuant mass on lateral left leg, may be amenable to I&D, ultrasound pending  - Treating empirically with vancomycin given suspicion for MRSA  - Follow clinical progress, inflammatory markers, convert to PO abx as appropriate    2. Acute on chronic kidney disease  - SCr 1.7 on arrival, up from apparent baseline of 1.3  - Likely a prerenal azotemia in setting of infection,  ATN possible  - IVF hydration  - Renally-dose meds  - Holding home lisinopril until renal function stabilizes - Repeat chem panel tomorrow   3. Thrombocytosis  - Platelets 800 on admission  - This is chronic and appears stable  - Uncertain etio      DVT ppx: SQ Heparin     Code Status: Full code Family Communication:  Yes, patient's granddaughter (caretaker) at bed side Disposition Plan: Admit to inpatient   Date of Service 04/13/2015    Vianne Bulls, MD [facility] Pager [phone]  If 7PM-7AM, please contact night-coverage [email or web address] Password [email or web address] 04/13/2015, 10:03 PM

## 2015-04-13 NOTE — ED Provider Notes (Signed)
CSN: [id number]     Arrival date & time 04/13/15  1632 History   First MD Initiated Contact with Patient 04/13/15 1845     Chief Complaint  Patient presents with  . Leg Pain     (Consider location/radiation/quality/duration/timing/severity/associated sxs/prior Treatment) Patient is a 82 y.o. female presenting with leg pain. The history is provided by a relative (Patient's granddaughter states that her left foot has become swollen and tender over last 3 days with redness in the left lower leg).  Leg Pain Lower extremity pain location: Foot. Pain details:    Quality:  Aching   Radiates to:  Does not radiate   Severity:  Moderate   Onset quality:  Sudden   Timing:  Constant Chronicity:  New Dislocation: no   Associated symptoms: no back pain and no fatigue     Past Medical History  Diagnosis Date  . Dementia   . Hypertension   . Stroke [facility])    History reviewed. No pertinent past surgical history. No family history on file. Social History  Substance Use Topics  . Smoking status: Never Smoker   . Smokeless tobacco: Never Used  . Alcohol Use: No   OB History    No data available     Review of Systems  Constitutional: Negative for appetite change and fatigue.  HENT: Negative for congestion, ear discharge and sinus pressure.   Eyes: Negative for discharge.  Respiratory: Negative for cough.   Cardiovascular: Negative for chest pain.  Gastrointestinal: Negative for abdominal pain and diarrhea.  Genitourinary: Negative for frequency and hematuria.  Musculoskeletal: Negative for back pain.  Skin: Positive for rash.  Neurological: Negative for seizures and headaches.  Psychiatric/Behavioral: Negative for hallucinations.      Allergies  Aspirin  Home Medications   Prior to Admission medications   Medication Sig Start Date End Date Taking? Authorizing Provider  Cholecalciferol (VITAMIN D3) 5000 UNITS CAPS Take 5,000 Units by mouth daily.   Yes Historical Provider,  MD  levothyroxine (SYNTHROID, LEVOTHROID) 25 MCG tablet Take 25 mcg by mouth daily before breakfast.   Yes Historical Provider, MD  lisinopril (PRINIVIL,ZESTRIL) 20 MG tablet Take 20 mg by mouth daily.   Yes Historical Provider, MD  Multiple Vitamin (MULTIVITAMIN WITH MINERALS) TABS tablet Take 1 tablet by mouth daily.   Yes Historical Provider, MD  pravastatin (PRAVACHOL) 20 MG tablet Take 1 tablet (20 mg total) by mouth daily at 6 PM. 03/02/15  Yes Charlynne Cousins, MD  Vitamins A & D (VITAMIN A & D) ointment Apply 1 application topically as needed (with every adult brief change.).   Yes Historical Provider, MD  clopidogrel (PLAVIX) 75 MG tablet Take 1 tablet (75 mg total) by mouth daily. Patient not taking: Reported on 04/13/2015 03/02/15   Charlynne Cousins, MD   BP 134/57 mmHg  Pulse 89  Temp(Src) 98.8 F (37.1 C) (Oral)  Resp 17  SpO2 96% Physical Exam  Constitutional: She appears well-developed.  HENT:  Head: Normocephalic.  Eyes: Conjunctivae and EOM are normal. No scleral icterus.  Neck: Neck supple. No thyromegaly present.  Cardiovascular: Normal rate and regular rhythm.  Exam reveals no gallop and no friction rub.   No murmur heard. Pulmonary/Chest: No stridor. She has no wheezes. She has no rales. She exhibits no tenderness.  Abdominal: She exhibits no distension. There is no tenderness. There is no rebound.  Musculoskeletal: She exhibits edema.  Lymphadenopathy:    She has no cervical adenopathy.  Neurological: She is alert.  She exhibits normal muscle tone. Coordination normal.  Patient alert but only oriented to person  Skin: Rash noted. No erythema.  Swelling tenderness redness left lower extremity consistent with cellulitis  Psychiatric: She has a normal mood and affect. Her behavior is normal.    ED Course  Procedures (including critical care time) Labs Review Labs Reviewed  CBC WITH DIFFERENTIAL/PLATELET - Abnormal; Notable for the following:    WBC 17.0  (*)    RBC 5.38 (*)    MCV 76.0 (*)    MCH 25.8 (*)    RDW 16.7 (*)    Platelets 806 (*)    Neutro Abs 13.4 (*)    Monocytes Absolute 1.7 (*)    All other components within normal limits  COMPREHENSIVE METABOLIC PANEL - Abnormal; Notable for the following:    Glucose, Bld 124 (*)    BUN 47 (*)    Creatinine, Ser 1.70 (*)    Albumin 3.3 (*)    GFR calc non Af Amer 26 (*)    GFR calc Af Amer 30 (*)    All other components within normal limits    Imaging Review Dg Tibia/fibula Left  04/13/2015  CLINICAL DATA:  Recent fall. Left leg pain and swelling. Initial encounter. EXAM: LEFT TIBIA AND FIBULA - 2 VIEW COMPARISON:  None. FINDINGS: There is no evidence of fracture or other focal bone lesions. Generalized osteopenia noted. Osteoarthritis seen involving lateral compartment knee. Extensive peripheral vascular calcification noted. Soft tissue swelling is seen along the lateral aspect of the fibular shaft. IMPRESSION: Lateral soft tissue swelling.  No evidence of fracture. Electronically Signed   By: Earle Gell M.D.   On: 04/13/2015 17:30   Dg Ankle Complete Left  04/13/2015  CLINICAL DATA:  Recent fall. Left ankle injury and pain. Initial encounter. EXAM: LEFT ANKLE COMPLETE - 3+ VIEW COMPARISON:  None. FINDINGS: There is no evidence of fracture, dislocation, or joint effusion. There is no evidence of arthropathy or other focal bone abnormality. Well corticated ossific densities are seen along the talar neck, likely due to old avulsion injuries or chronic trauma. Generalized osteopenia noted. Lateral soft tissue swelling also demonstrated. IMPRESSION: Lateral soft tissue swelling. No evidence of acute fracture or dislocation. Electronically Signed   By: Earle Gell M.D.   On: 04/13/2015 17:32   I have personally reviewed and evaluated these images and lab results as part of my medical decision-making.   EKG Interpretation None      MDM   Final diagnoses:  Cellulitis of left lower  extremity    Cellulitis left lower extremity will admit for IV antibiotics    Milton Ferguson, MD 04/13/15 2113

## 2015-04-14 ENCOUNTER — Inpatient Hospital Stay (HOSPITAL_COMMUNITY): Payer: Medicare HMO

## 2015-04-14 DIAGNOSIS — L03119 Cellulitis of unspecified part of limb: Secondary | ICD-10-CM

## 2015-04-14 DIAGNOSIS — E46 Unspecified protein-calorie malnutrition: Secondary | ICD-10-CM

## 2015-04-14 DIAGNOSIS — R52 Pain, unspecified: Secondary | ICD-10-CM

## 2015-04-14 DIAGNOSIS — L02419 Cutaneous abscess of limb, unspecified: Secondary | ICD-10-CM

## 2015-04-14 DIAGNOSIS — R609 Edema, unspecified: Secondary | ICD-10-CM

## 2015-04-14 LAB — CBC WITH DIFFERENTIAL/PLATELET
BASOS PCT: 0 %
Basophils Absolute: 0.1 10*3/uL (ref 0.0–0.1)
Eosinophils Absolute: 0.2 10*3/uL (ref 0.0–0.7)
Eosinophils Relative: 1 %
HEMATOCRIT: 38.2 % (ref 36.0–46.0)
Hemoglobin: 12.2 g/dL (ref 12.0–15.0)
LYMPHS PCT: 13 %
Lymphs Abs: 1.9 10*3/uL (ref 0.7–4.0)
MCH: 24.4 pg — AB (ref 26.0–34.0)
MCHC: 31.9 g/dL (ref 30.0–36.0)
MCV: 76.6 fL — AB (ref 78.0–100.0)
MONO ABS: 0.9 10*3/uL (ref 0.1–1.0)
MONOS PCT: 6 %
NEUTROS ABS: 11 10*3/uL — AB (ref 1.7–7.7)
Neutrophils Relative %: 80 %
Platelets: 851 10*3/uL — ABNORMAL HIGH (ref 150–400)
RBC: 4.99 MIL/uL (ref 3.87–5.11)
RDW: 16.8 % — AB (ref 11.5–15.5)
WBC: 14 10*3/uL — ABNORMAL HIGH (ref 4.0–10.5)

## 2015-04-14 LAB — BASIC METABOLIC PANEL
Anion gap: 10 (ref 5–15)
BUN: 49 mg/dL — AB (ref 6–20)
CHLORIDE: 109 mmol/L (ref 101–111)
CO2: 23 mmol/L (ref 22–32)
CREATININE: 1.52 mg/dL — AB (ref 0.44–1.00)
Calcium: 9.6 mg/dL (ref 8.9–10.3)
GFR calc Af Amer: 35 mL/min — ABNORMAL LOW (ref >60)
GFR calc non Af Amer: 30 mL/min — ABNORMAL LOW (ref >60)
GLUCOSE: 142 mg/dL — AB (ref 65–99)
Potassium: 4 mmol/L (ref 3.5–5.1)
Sodium: 142 mmol/L (ref 135–145)

## 2015-04-14 LAB — LACTIC ACID, PLASMA: Lactic Acid, Venous: 0.9 mmol/L (ref 0.5–2.0)

## 2015-04-14 LAB — C-REACTIVE PROTEIN: CRP: 4.8 mg/dL — ABNORMAL HIGH (ref <1.0)

## 2015-04-14 LAB — GLUCOSE, CAPILLARY: Glucose-Capillary: 81 mg/dL (ref 65–99)

## 2015-04-14 LAB — HIV ANTIBODY (ROUTINE TESTING W REFLEX): HIV Screen 4th Generation wRfx: NONREACTIVE

## 2015-04-14 LAB — PREALBUMIN: Prealbumin: 18 mg/dL (ref 18–38)

## 2015-04-14 LAB — SEDIMENTATION RATE: Sed Rate: 34 mm/hr — ABNORMAL HIGH (ref 0–22)

## 2015-04-14 LAB — TSH: TSH: 6.645 u[IU]/mL — ABNORMAL HIGH (ref 0.350–4.500)

## 2015-04-14 NOTE — Progress Notes (Addendum)
VASCULAR LAB PRELIMINARY  ARTERIAL  ABI completed:    RIGHT    LEFT    PRESSURE WAVEFORM  PRESSURE WAVEFORM  BRACHIAL 192 Triphasic BRACHIAL 164 Triphasic  AT 51 Severely Dampened Monophasic AT - -  PT 78 Severely Dampened Monophasic PT - -    RIGHT LEFT  ABI 0.41 N/A   Right - ABIs indicate a severe reduction in arterial flow with severely abnormal Doppler waveforms. Left - ABIs and Doppler waveforms could not be obtained due to patient's constant drawing of the legs and the medial rotation of the leg. Patient could not tolerate even the probe being placed on the ankle. Foot, or toe  Shakinah Navis, RVS 04/14/2015, 11:58 AM

## 2015-04-14 NOTE — Progress Notes (Signed)
Patient ID: Rachel [name], female   DOB: 07-10-1927, 82 y.o.   MRN: [id number]  [facility] PROGRESS NOTE  Yatziry Vile [id number] DOB: Feb 01, 1928 DOA: Apr 16, 2015 PCP: Rachell Cipro, MD   Brief narrative:    82 y.o. female with PMH of hypertension, hypothyroidism, dementia, and ischemic CVA last month with resulting left-sided deficits who presented to the ED with left lower leg swelling and pain. The patient is cared for at home by her granddaughter who had noticed a small bump on the lateral left calf approximately 3 days PTA, associated with swelling and erythema throughout the left calf and foot with interval increase in the size of a the original bump.   In ED, patient was found to be afebrile, saturating well on room air, and with vital signs stable. Initial blood work is notable for an elevated serum creatinine of 1.7, up from apparent baseline of 1.3. CBC notable for a leukocytosis to 17,000 and a thrombocytosis to 800,000. Patient was noted to have left lower extremity swelling and erythema and radiographs were obtained. X-ray demonstrated soft tissue edema without evidence for acute fracture or bony involvement. The hospitalists were consulted at this time and the patient will be admitted for ongoing evaluation and management of suspected purulent cellulitis of the left lower leg.  Assessment/Plan:    1. Purulent cellulitis, left leg  - Fluctuant mass on lateral left leg, may be amenable to I&D, ultrasound pending  - Treating empirically with vancomycin given suspicion for MRSA  - Follow clinical progress, inflammatory markers, convert to PO abx in next 24 hours if improving cellulits   2. Acute on chronic kidney disease  - SCr 1.7 on arrival, up from apparent baseline of 1.3  - 1.5 this AM - Likely a prerenal azotemia in setting of infection, ATN possible  - Renally-dose meds  - Holding home lisinopril until renal function stabilizes - Repeat chem panel  tomorrow   3. Thrombocytosis  - Platelets 800 on admission  - This is chronic and appears stable  - CBC in AM  DVT prophylaxis - Heparin SQ  Code Status: Full.  Family Communication:  plan of care discussed with the patient, no family at bedside  Disposition Plan: Home by 1/23  IV access:  Peripheral IV  Procedures and diagnostic studies:    Dg Tibia/fibula Left 16-Apr-2015 Lateral soft tissue swelling.  No evidence of fracture.   Dg Ankle Complete Left April 16, 2015 Lateral soft tissue swelling. No evidence of acute fracture or dislocation.   Medical Consultants:  None  Other Consultants:  None  IAnti-Infectives:   Vancomycin 04/15/22 -->  Faye Ramsay, MD  [facility] Pager [phone]  If 7PM-7AM, please contact night-coverage [email or web address] Password [email or web address] 04/14/2015, 7:11 PM   LOS: 1 day   HPI/Subjective: No events overnight.   Objective: Filed Vitals:   04/14/15 0702 04/14/15 1400 04/14/15 1431 04/14/15 1700  BP: 144/84  136/63   Pulse: 91  96   Temp: 98.9 F (37.2 C)  98.7 F (37.1 C)   TempSrc: Oral  Oral   Resp: 16  16   Height:  5\' 4"  (1.626 m)    Weight:    45.9 kg (101 lb 3.1 oz)  SpO2: 99%  96%     Intake/Output Summary (Last 24 hours) at 04/14/15 1911 Last data filed at 04/14/15 1800  Gross per 24 hour  Intake 1396.25 ml  Output      0 ml  Net 1396.25 ml    Exam:   General:  Pt is alert, follows commands appropriately, not in acute distress  Cardiovascular: Regular rate and rhythm, S1/S2, no murmurs, no rubs, no gallops  Respiratory: Clear to auscultation bilaterally, no wheezing, no crackles, no rhonchi  Abdomen: Soft, non tender, non distended, bowel sounds present, no guarding  Extremities: left leg edema and erythema, TTP   Data Reviewed: Basic Metabolic Panel:  Recent Labs Lab 04/13/15 2006 04/14/15 0135  NA 143 142  K 4.7 4.0  CL 109 109  CO2 23 23  GLUCOSE 124* 142*  BUN 47* 49*  CREATININE 1.70* 1.52*  CALCIUM 9.8  9.6   Liver Function Tests:  Recent Labs Lab 04/13/15 2006  AST 25  ALT 18  ALKPHOS 80  BILITOT 0.4  PROT 7.4  ALBUMIN 3.3*   CBC:  Recent Labs Lab 04/13/15 2006 04/14/15 0135  WBC 17.0* 14.0*  NEUTROABS 13.4* 11.0*  HGB 13.9 12.2  HCT 40.9 38.2  MCV 76.0* 76.6*  PLT 806* 851*   CBG:  Recent Labs Lab 04/14/15 0758  GLUCAP 81    Recent Results (from the past 240 hour(s))  Culture, blood (routine x 2)     Status: None (Preliminary result)   Collection Time: 04/13/15 10:55 PM  Result Value Ref Range Status   Specimen Description BLOOD LEFT ARM  Final   Special Requests 5 CC AEROBIC BOTTLE ONLY  Final   Culture PENDING  Incomplete   Report Status PENDING  Incomplete     Scheduled Meds: . cholecalciferol  5,000 Units Oral Daily  . clopidogrel  75 mg Oral Daily  . heparin  5,000 Units Subcutaneous 3 times per day  . levothyroxine  25 mcg Oral QAC breakfast  . multivitamin with minerals  1 tablet Oral Daily  . pravastatin  20 mg Oral q1800  . [START ON 04/15/2015] vancomycin  500 mg Intravenous Q48H   Continuous Infusions:

## 2015-04-14 NOTE — Progress Notes (Addendum)
VASCULAR LAB PRELIMINARY  PRELIMINARY  PRELIMINARY  PRELIMINARY  Bilateral lower extremity arterial duplex completed.    Preliminary report:  Technically difficult due lateral rotation of the right lower extremity and medial rotation of the right foot. and medial rotation of the entire left lower extremity  Bilaterally there appears to be no areas of adequate arterial flow to the feet with irregular calcific plaque throughout. There are minute questionable attempts of collateral filling, but occlusive areas prevent any success of recanalization.  Neil Brickell, [place], RVS 04/14/2015, 12:06 PM

## 2015-04-15 DIAGNOSIS — I6930 Unspecified sequelae of cerebral infarction: Secondary | ICD-10-CM

## 2015-04-15 DIAGNOSIS — F0390 Unspecified dementia without behavioral disturbance: Secondary | ICD-10-CM

## 2015-04-15 DIAGNOSIS — L89614 Pressure ulcer of right heel, stage 4: Secondary | ICD-10-CM

## 2015-04-15 DIAGNOSIS — L8990 Pressure ulcer of unspecified site, unspecified stage: Secondary | ICD-10-CM | POA: Diagnosis present

## 2015-04-15 HISTORY — DX: Pressure ulcer of right heel, stage 4: L89.614

## 2015-04-15 LAB — BASIC METABOLIC PANEL
Anion gap: 9 (ref 5–15)
BUN: 49 mg/dL — AB (ref 6–20)
CHLORIDE: 111 mmol/L (ref 101–111)
CO2: 24 mmol/L (ref 22–32)
Calcium: 9.5 mg/dL (ref 8.9–10.3)
Creatinine, Ser: 1.58 mg/dL — ABNORMAL HIGH (ref 0.44–1.00)
GFR calc Af Amer: 33 mL/min — ABNORMAL LOW (ref >60)
GFR calc non Af Amer: 29 mL/min — ABNORMAL LOW (ref >60)
Glucose, Bld: 125 mg/dL — ABNORMAL HIGH (ref 65–99)
POTASSIUM: 4.2 mmol/L (ref 3.5–5.1)
SODIUM: 144 mmol/L (ref 135–145)

## 2015-04-15 LAB — CBC
HEMATOCRIT: 36.5 % (ref 36.0–46.0)
HEMOGLOBIN: 11.9 g/dL — AB (ref 12.0–15.0)
MCH: 24.7 pg — AB (ref 26.0–34.0)
MCHC: 32.6 g/dL (ref 30.0–36.0)
MCV: 75.7 fL — AB (ref 78.0–100.0)
Platelets: 882 10*3/uL — ABNORMAL HIGH (ref 150–400)
RBC: 4.82 MIL/uL (ref 3.87–5.11)
RDW: 16.9 % — ABNORMAL HIGH (ref 11.5–15.5)
WBC: 13 10*3/uL — ABNORMAL HIGH (ref 4.0–10.5)

## 2015-04-15 LAB — GLUCOSE, CAPILLARY: GLUCOSE-CAPILLARY: 103 mg/dL — AB (ref 65–99)

## 2015-04-15 MED ORDER — ENSURE ENLIVE PO LIQD
237.0000 mL | Freq: Two times a day (BID) | ORAL | Status: DC
  Administered 2015-04-15 – 2015-04-17 (×4): 237 mL via ORAL

## 2015-04-15 MED ORDER — PRO-STAT SUGAR FREE PO LIQD
30.0000 mL | Freq: Two times a day (BID) | ORAL | Status: DC
  Administered 2015-04-15 – 2015-04-17 (×5): 30 mL via ORAL
  Filled 2015-04-15 (×6): qty 30

## 2015-04-15 NOTE — Progress Notes (Signed)
Initial Nutrition Assessment  DOCUMENTATION CODES:   Severe malnutrition in context of chronic illness, Underweight  INTERVENTION:   Provide Ensure Enlive po BID, each supplement provides 350 kcal and 20 grams of protein Provide Prostat liquid protein PO 30 ml BID with meals, each supplement provides 100 kcal, 15 grams protein. RD to continue to monitor  NUTRITION DIAGNOSIS:   Malnutrition related to chronic illness as evidenced by severe depletion of body fat, severe depletion of muscle mass.  GOAL:   Patient will meet greater than or equal to 90% of their needs  MONITOR:   PO intake, Supplement acceptance, Labs, Weight trends, Skin, I & O's  REASON FOR ASSESSMENT:   Consult Assessment of nutrition requirement/status  ASSESSMENT:   82 y.o. female with PMH of hypertension, hypothyroidism, dementia, and ischemic CVA last month with resulting left-sided deficits who presented to the ED with left lower leg swelling and pain. The patient is cared for at home by her granddaughter who had noticed a small bump on the lateral left calf approximately 3 days PTA, associated with swelling and erythema throughout the left calf and foot with interval increase in the size of a the original bump.   Pt in room with granddaughter at bedside. Pt nonverbal. Pt has continued to have normal appetite and has been eating most of her meals with no chewing or swallowing issues. Pt with abscess and pressure wound on LE which increase her needs. RD to order ensure supplements and prostat supplements to provide extra protein. Nutrition-Focused physical exam completed. Findings are severe fat depletion, severe muscle depletion, and severe edema.   Labs reviewed: Elevated BUN & Creatinine  Diet Order:  Diet regular Room service appropriate?: Yes; Fluid consistency:: Thin  Skin:  Wound (see comment) (leg abscess, unstageable pressure wound on heel)  Last BM:  1/20  Height:   Ht Readings from Last 1  Encounters:  04/14/15 5\' 4"  (1.626 m)    Weight:   Wt Readings from Last 1 Encounters:  04/14/15 101 lb 3.1 oz (45.9 kg)    Ideal Body Weight:  54.5 kg  BMI:  Body mass index is 17.36 kg/(m^2).  Estimated Nutritional Needs:   Kcal:  1300-1500  Protein:  65-75g  Fluid:  1.5L/day  EDUCATION NEEDS:   Education needs addressed  Clayton Bibles, MS, RD, LDN Pager: [phone] After Hours Pager: [phone]

## 2015-04-15 NOTE — Progress Notes (Signed)
Utilization review completed.  

## 2015-04-15 NOTE — Progress Notes (Addendum)
Patient ID: Rachel [name], female   DOB: July 07, 1927, 82 y.o.   MRN: [id number]  [facility] PROGRESS NOTE  Maddisyn Burstein [id number] DOB: June 18, 1927 DOA: 05/10/15 PCP: Rachell Cipro, MD   Brief narrative:    82 y.o. female with PMH of hypertension, hypothyroidism, dementia, and ischemic CVA last month with resulting left-sided deficits who presented to the ED with left lower leg swelling and pain. The patient is cared for at home by her granddaughter who had noticed a small bump on the lateral left calf approximately 3 days PTA, associated with swelling and erythema throughout the left calf and foot with interval increase in the size of a the original bump.   In ED, patient was found to be afebrile, saturating well on room air, and with vital signs stable. Initial blood work is notable for an elevated serum creatinine of 1.7, up from apparent baseline of 1.3. CBC notable for a leukocytosis to 17,000 and a thrombocytosis to 800,000. Patient was noted to have left lower extremity swelling and erythema and radiographs were obtained. X-ray demonstrated soft tissue edema without evidence for acute fracture or bony involvement. The hospitalists were consulted at this time and the patient will be admitted for ongoing evaluation and management of suspected purulent cellulitis of the left lower leg.  Assessment/Plan:    1. Purulent cellulitis, left leg  - Fluctuant mass on lateral left leg, may be amenable to I&D, ultrasound with no clear evidence of DVT - study was difficult due to pt not being able to tolerate procedure  - Treating empirically with vancomycin given suspicion for MRSA  - Follow clinical progress, inflammatory markers, ortho consult in AM - WOC team consulted   2. Acute on chronic kidney disease, stage II - III - SCr 1.7 on arrival, up from apparent baseline of 1.3  - 1.5 this AM - Likely a prerenal azotemia in setting of infection, ATN possible  - Renally-dose  meds  - Holding home lisinopril until renal function stabilizes - BMP in AM  3. Thrombocytosis  - Platelets 800 on admission  - This is chronic and appears stable  - CBC in AM  4. Recent ischemic CVA - PT/OT eval - will need to continue at home - continue Plavix  5. Hypothyroidism - continue synthroid   6. Pressure ulcer - right heel area, present prior to the admission - wound care team consulted   DVT prophylaxis - Heparin SQ  Code Status: DNR Family Communication:  plan of care discussed with the patient, POA over the phone  Disposition Plan: Home by 1/23  IV access:  Peripheral IV  Procedures and diagnostic studies:    Dg Tibia/fibula Left 2015/05/10 Lateral soft tissue swelling.  No evidence of fracture.   Dg Ankle Complete Left 2015/05/10 Lateral soft tissue swelling. No evidence of acute fracture or dislocation.   Medical Consultants:  Wound care team   Other Consultants:  None  IAnti-Infectives:   Vancomycin May 09, 2022 -->  Faye Ramsay, MD  [facility] Pager [phone]  If 7PM-7AM, please contact night-coverage [email or web address] Password [facility] 04/15/2015, 1:33 PM   LOS: 2 days   HPI/Subjective: No events overnight.   Objective: Filed Vitals:   04/14/15 1431 04/14/15 1700 04/14/15 2130 04/15/15 0555  BP: 136/63  152/81 157/84  Pulse: 96  86 76  Temp: 98.7 F (37.1 C)  98.2 F (36.8 C) 99.5 F (37.5 C)  TempSrc: Oral  Oral Oral  Resp: 16  16 16   Height:      Weight:  45.9 kg (101 lb 3.1 oz)    SpO2: 96%  97% 97%    Intake/Output Summary (Last 24 hours) at 04/15/15 1333 Last data filed at 04/15/15 0900  Gross per 24 hour  Intake   1200 ml  Output      0 ml  Net   1200 ml    Exam:   General:  Pt is alert, not in acute distress  Cardiovascular: Regular rate and rhythm, no rubs, no gallops  Respiratory: Clear to auscultation bilaterally, no wheezing, diminished breath sounds at bases   Abdomen: Soft, non tender, non distended, bowel  sounds present, no guarding  Extremities: Right heel with unstageable pressure wound. Left lateral leg with ~7x5 cm focus of fluctuance on background of erythema and swelling; right foot edematous. Muscle atrophy throughout.  Data Reviewed: Basic Metabolic Panel:  Recent Labs Lab 04/13/15 2006 04/14/15 0135 04/15/15 0546  NA 143 142 144  K 4.7 4.0 4.2  CL 109 109 111  CO2 23 23 24   GLUCOSE 124* 142* 125*  BUN 47* 49* 49*  CREATININE 1.70* 1.52* 1.58*  CALCIUM 9.8 9.6 9.5   Liver Function Tests:  Recent Labs Lab 04/13/15 2006  AST 25  ALT 18  ALKPHOS 80  BILITOT 0.4  PROT 7.4  ALBUMIN 3.3*   CBC:  Recent Labs Lab 04/13/15 2006 04/14/15 0135 04/15/15 0546  WBC 17.0* 14.0* 13.0*  NEUTROABS 13.4* 11.0*  --   HGB 13.9 12.2 11.9*  HCT 40.9 38.2 36.5  MCV 76.0* 76.6* 75.7*  PLT 806* 851* 882*   CBG:  Recent Labs Lab 04/14/15 0758 04/15/15 0801  GLUCAP 81 103*   Recent Results (from the past 240 hour(s))  Culture, blood (routine x 2)     Status: None (Preliminary result)   Collection Time: 04/13/15 10:55 PM  Result Value Ref Range Status   Specimen Description BLOOD LEFT ARM  Final   Special Requests 5 CC AEROBIC BOTTLE ONLY  Final   Culture PENDING  Incomplete   Report Status PENDING  Incomplete    Scheduled Meds: . cholecalciferol  5,000 Units Oral Daily  . clopidogrel  75 mg Oral Daily  . heparin  5,000 Units Subcutaneous 3 times per day  . levothyroxine  25 mcg Oral QAC breakfast  . multivitamin with minerals  1 tablet Oral Daily  . pravastatin  20 mg Oral q1800  . vancomycin  500 mg Intravenous Q48H

## 2015-04-16 DIAGNOSIS — L89614 Pressure ulcer of right heel, stage 4: Secondary | ICD-10-CM

## 2015-04-16 DIAGNOSIS — Z8673 Personal history of transient ischemic attack (TIA), and cerebral infarction without residual deficits: Secondary | ICD-10-CM

## 2015-04-16 LAB — CBC
HEMATOCRIT: 34.7 % — AB (ref 36.0–46.0)
HEMOGLOBIN: 11.4 g/dL — AB (ref 12.0–15.0)
MCH: 24.7 pg — AB (ref 26.0–34.0)
MCHC: 32.9 g/dL (ref 30.0–36.0)
MCV: 75.3 fL — ABNORMAL LOW (ref 78.0–100.0)
Platelets: 976 10*3/uL (ref 150–400)
RBC: 4.61 MIL/uL (ref 3.87–5.11)
RDW: 16.5 % — AB (ref 11.5–15.5)
WBC: 13.7 10*3/uL — ABNORMAL HIGH (ref 4.0–10.5)

## 2015-04-16 LAB — BASIC METABOLIC PANEL
Anion gap: 9 (ref 5–15)
BUN: 48 mg/dL — AB (ref 6–20)
CALCIUM: 9.4 mg/dL (ref 8.9–10.3)
CHLORIDE: 107 mmol/L (ref 101–111)
CO2: 24 mmol/L (ref 22–32)
CREATININE: 1.32 mg/dL — AB (ref 0.44–1.00)
GFR calc non Af Amer: 36 mL/min — ABNORMAL LOW (ref >60)
GFR, EST AFRICAN AMERICAN: 41 mL/min — AB (ref >60)
Glucose, Bld: 127 mg/dL — ABNORMAL HIGH (ref 65–99)
Potassium: 4.8 mmol/L (ref 3.5–5.1)
SODIUM: 140 mmol/L (ref 135–145)

## 2015-04-16 LAB — PATHOLOGIST SMEAR REVIEW

## 2015-04-16 LAB — GLUCOSE, CAPILLARY: GLUCOSE-CAPILLARY: 100 mg/dL — AB (ref 65–99)

## 2015-04-16 MED ORDER — VANCOMYCIN HCL 500 MG IV SOLR
500.0000 mg | Freq: Once | INTRAVENOUS | Status: AC
  Administered 2015-04-16: 500 mg via INTRAVENOUS
  Filled 2015-04-16 (×2): qty 500

## 2015-04-16 MED ORDER — HYDRALAZINE HCL 20 MG/ML IJ SOLN
5.0000 mg | INTRAMUSCULAR | Status: DC | PRN
  Administered 2015-04-16 – 2015-04-17 (×2): 5 mg via INTRAVENOUS
  Filled 2015-04-16 (×2): qty 1

## 2015-04-16 MED ORDER — VANCOMYCIN HCL IN DEXTROSE 1-5 GM/200ML-% IV SOLN: 1000.0000 mg | INTRAVENOUS | Status: DC

## 2015-04-16 NOTE — Progress Notes (Signed)
CRITICAL VALUE ALERT  Critical value received:  Platelets [id number]  Date of notification:  04/16/2015  Time of notification:  0558  Critical value read back:Yes.    Nurse who received alert:  Laurell Roof, RN  MD notified (1st page):  Fredirick Maudlin, NP  Time of first page: [phone]  MD notified (2nd page): Fredirick Maudlin, NP  Time of second page: [phone]  Responding MD:  N/A  Time MD responded:  N/A

## 2015-04-16 NOTE — Progress Notes (Signed)
Patient ID: Rachel [name], female   DOB: 1928/03/12, 82 y.o.   MRN: [id number]  [facility] PROGRESS NOTE  Rachel [name] [id number] DOB: [birth year] DOA: 04/18/2015 PCP: Rachell Cipro, MD   Brief narrative:    82 y.o. female with PMH of hypertension, hypothyroidism, dementia, and ischemic CVA last month with resulting left-sided deficits who presented to the ED with left lower leg swelling and pain. The patient is cared for at home by her granddaughter who had noticed a small bump on the lateral left calf approximately 3 days PTA, associated with swelling and erythema throughout the left calf and foot with interval increase in the size of a the original bump.   In ED, patient was found to be afebrile, saturating well on room air, and with vital signs stable. Initial blood work is notable for an elevated serum creatinine of 1.7, up from apparent baseline of 1.3. CBC notable for a leukocytosis to 17,000 and a thrombocytosis to 800,000. Patient was noted to have left lower extremity swelling and erythema and radiographs were obtained. X-ray demonstrated soft tissue edema without evidence for acute fracture or bony involvement. The hospitalists were consulted at this time and the patient will be admitted for ongoing evaluation and management of suspected purulent cellulitis of the left lower leg.  Assessment/Plan:    1. Purulent cellulitis, left leg  - LLE with edema and lateral LE with two areas of warmth, induration and erythema, proximal 5cm x 3cm and the distal 3cm round - left foot presents with digits 2-5 discolored c/w peripheral vascular disease  - Fluctuant mass on lateral left leg, may be amenable to I&D, ultrasound with no clear evidence of DVT - study was difficult due to pt not being able to tolerate procedure  - Treating empirically with vancomycin given suspicion for MRSA and persistently elevated WBC - ortho consulted for assistance  - WOC team consulted,apprecaite  assistance   2. Acute on chronic kidney disease, stage II - III - SCr 1.7 on arrival, up from apparent baseline of 1.3  - 1.3 this AM, at baseline  - Holding home lisinopril until renal function stabilizes - BMP in AM  3. Thrombocytosis  - Platelets 800 on admission  - This is chronic and appears stable  - CBC in AM  4. Recent ischemic CVA - PT/OT eval - will need to continue at home - continue Plavix  5. Hypothyroidism - continue synthroid   6. Pressure ulcers - Deep tissue pressure injury on right medial heel 3cm x 2cm intact, blood-filled blister with minimal fluctuance  DVT prophylaxis - Heparin SQ  Code Status: DNR Family Communication:  plan of care discussed with the patient, POA over the phone  Disposition Plan: Home when off IV ABX and cellulitis resolved   IV access:  Peripheral IV  Procedures and diagnostic studies:    Dg Tibia/fibula Left April 18, 2015 Lateral soft tissue swelling.  No evidence of fracture.   Dg Ankle Complete Left Apr 18, 2015 Lateral soft tissue swelling. No evidence of acute fracture or dislocation.   Medical Consultants:  Wound care team  Ortho   Other Consultants:  None  IAnti-Infectives:   Vancomycin 2022-04-17 -->  Faye Ramsay, MD  [facility] Pager [phone]  If 7PM-7AM, please contact night-coverage [email or web address] Password [email or web address] 04/16/2015, 1:46 PM   LOS: 3 days   HPI/Subjective: No events overnight.   Objective: Filed Vitals:   04/15/15 1500 04/15/15 1518 04/15/15 2145 04/16/15 0645  BP: 137/115 148/96 157/73 163/72  Pulse: 79  77 76  Temp: 97.8 F (36.6 C)  98.3 F (36.8 C) 98.5 F (36.9 C)  TempSrc: Oral  Oral Oral  Resp: 18  16 16   Height:      Weight:      SpO2: 95%  100% 99%    Intake/Output Summary (Last 24 hours) at 04/16/15 1346 Last data filed at 04/16/15 1300  Gross per 24 hour  Intake    800 ml  Output      0 ml  Net    800 ml    Exam:   General:  Pt is alert, not in acute  distress  Cardiovascular: Regular rate and rhythm, no rubs, no gallops  Respiratory: Clear to auscultation bilaterally, no wheezing, diminished breath sounds at bases   Abdomen: Soft, non tender, non distended, bowel sounds present, no guarding  Extremities: Left lateral leg with ~5x3 cm focus of fluctuance on background of erythema and swelling; right foot edematous. Muscle atrophy throughout.  Data Reviewed: Basic Metabolic Panel:  Recent Labs Lab 04/13/15 2006 04/14/15 0135 04/15/15 0546 04/16/15 0521  NA 143 142 144 140  K 4.7 4.0 4.2 4.8  CL 109 109 111 107  CO2 23 23 24 24   GLUCOSE 124* 142* 125* 127*  BUN 47* 49* 49* 48*  CREATININE 1.70* 1.52* 1.58* 1.32*  CALCIUM 9.8 9.6 9.5 9.4   Liver Function Tests:  Recent Labs Lab 04/13/15 2006  AST 25  ALT 18  ALKPHOS 80  BILITOT 0.4  PROT 7.4  ALBUMIN 3.3*   CBC:  Recent Labs Lab 04/13/15 2006 04/14/15 0135 04/15/15 0546 04/16/15 0521  WBC 17.0* 14.0* 13.0* 13.7*  NEUTROABS 13.4* 11.0*  --   --   HGB 13.9 12.2 11.9* 11.4*  HCT 40.9 38.2 36.5 34.7*  MCV 76.0* 76.6* 75.7* 75.3*  PLT 806* 851* 882* 976*   CBG:  Recent Labs Lab 04/14/15 0758 04/15/15 0801 04/16/15 0756  GLUCAP 81 103* 100*   Recent Results (from the past 240 hour(s))  Culture, blood (routine x 2)     Status: None (Preliminary result)   Collection Time: 04/13/15 10:50 PM  Result Value Ref Range Status   Specimen Description BLOOD RIGHT ARM  Final   Special Requests BOTTLES DRAWN AEROBIC AND ANAEROBIC 5 CC  Final   Culture   Final    NO GROWTH 1 DAY Performed at [facility]    Report Status PENDING  Incomplete  Culture, blood (routine x 2)     Status: None (Preliminary result)   Collection Time: 04/13/15 10:55 PM  Result Value Ref Range Status   Specimen Description BLOOD LEFT ARM  Final   Special Requests 5 CC AEROBIC BOTTLE ONLY  Final   Culture   Final    NO GROWTH 1 DAY Performed at [facility]    Report  Status PENDING  Incomplete    Scheduled Meds: . cholecalciferol  5,000 Units Oral Daily  . clopidogrel  75 mg Oral Daily  . feeding supplement (ENSURE ENLIVE)  237 mL Oral BID BM  . feeding supplement (PRO-STAT SUGAR FREE 64)  30 mL Oral BID  . heparin  5,000 Units Subcutaneous 3 times per day  . levothyroxine  25 mcg Oral QAC breakfast  . multivitamin with minerals  1 tablet Oral Daily  . pravastatin  20 mg Oral q1800  . [START ON 04/18/2015] vancomycin  1,000 mg Intravenous Q48H

## 2015-04-16 NOTE — Consult Note (Signed)
   ORTHOPAEDIC CONSULTATION  REQUESTING PHYSICIAN: Theodis Blaze, MD  Chief Complaint: LLE swelling  HPI: Rachel [name] is a 82 y.o. female who presents with LLE swelling of 3 days.  Patient has dementia therefore HPI is limited to hospitalist H&P.  Patient is cared for by granddaughter at home who noticed small bump on lateral calf.  She noticed increased swelling and erythema and increase in size.  Past Medical History  Diagnosis Date  . Dementia   . Hypertension   . Stroke [facility])    History reviewed. No pertinent past surgical history. Social History   Social History  . Marital Status: Married    Spouse Name: N/A  . Number of Children: N/A  . Years of Education: N/A   Social History Main Topics  . Smoking status: Never Smoker   . Smokeless tobacco: Never Used  . Alcohol Use: No  . Drug Use: No  . Sexual Activity: Not Asked   Other Topics Concern  . None   Social History Narrative   Family History  Problem Relation Age of Onset  . Obesity Other    - negative except otherwise stated in the family history section Allergies  Allergen Reactions  . Aspirin     Unknown reaction.    Prior to Admission medications   Medication Sig Start Date End Date Taking? Authorizing Provider  Cholecalciferol (VITAMIN D3) 5000 UNITS CAPS Take 5,000 Units by mouth daily.   Yes Historical Provider, MD  levothyroxine (SYNTHROID, LEVOTHROID) 25 MCG tablet Take 25 mcg by mouth daily before breakfast.   Yes Historical Provider, MD  lisinopril (PRINIVIL,ZESTRIL) 20 MG tablet Take 20 mg by mouth daily.   Yes Historical Provider, MD  Multiple Vitamin (MULTIVITAMIN WITH MINERALS) TABS tablet Take 1 tablet by mouth daily.   Yes Historical Provider, MD  pravastatin (PRAVACHOL) 20 MG tablet Take 1 tablet (20 mg total) by mouth daily at 6 PM. 03/02/15  Yes Charlynne Cousins, MD  Vitamins A & D (VITAMIN A & D) ointment Apply 1 application topically as needed (with every adult brief change.).    Yes Historical Provider, MD  clopidogrel (PLAVIX) 75 MG tablet Take 1 tablet (75 mg total) by mouth daily. Patient not taking: Reported on 04/13/2015 03/02/15   Charlynne Cousins, MD   No results found. - pertinent xrays, CT, MRI studies were reviewed and independently interpreted  Positive ROS: All other systems have been reviewed and were otherwise negative with the exception of those mentioned in the HPI and as above.  Physical Exam: General: NAD Cardiovascular: No pedal edema Respiratory: No cyanosis, no use of accessory musculature GI: No organomegaly, abdomen is soft and non-tender Skin: No lesions in the area of chief complaint Neurologic: Sensation intact distally Psychiatric: Patient is demented Lymphatic: No axillary or cervical lymphadenopathy  MUSCULOSKELETAL:  - 5 cm diameter soft tissue swelling of left lateral calf c/w superficial hematoma - no drainage - no cellulitis or signs of infection  Assessment: Left leg hematoma  Plan: - patient is on plavix at baseline which predisposes her to hematomas - apply warm compresses to swelling, should resolve over the next couple of weeks - no signs of infection - ortho signed off  Thank you for the consult and the opportunity to see Rachel [name]  N. Eduard Roux, MD [place] 10:18 PM

## 2015-04-16 NOTE — Care Management Important Message (Signed)
Important Message  Patient Details  Name: Lavanya Mince MRN: [id number] Date of Birth: 23-Sep-1927   Medicare Important Message Given:  Yes    Camillo Flaming 04/16/2015, 2:53 [place] Message  Patient Details  Name: Kamaiyah Piccirilli MRN: [id number] Date of Birth: 17-Mar-1928   Medicare Important Message Given:  Yes    Camillo Flaming 04/16/2015, 2:53 PM

## 2015-04-16 NOTE — Progress Notes (Signed)
PT Cancellation Note  Patient Details Name: Rachel [name] MRN: [id number] DOB: 06-17-1927   Cancelled Treatment:    Reason Eval/Treat Not Completed: Other (comment)   Pt with hx of dementia and ischemic CVA last month with resulting left-sided deficits. Per 03/01/15 PT note "Currently she would need to be cared for at bed level and OOB only with a lift (for pt and family safety)" and hoyer lift was recommended at that time.  Family not present currently, attempted to call granddaughter at home however busy signal.  Requested RN inquire about mobility at home if family visits.  Will check back as schedule permits.   Acelynn Dejonge,KATHrine E 04/16/2015, 3:00 PM Carmelia Bake, PT, DPT 04/16/2015 Pager: [phone]

## 2015-04-16 NOTE — Progress Notes (Signed)
Pharmacy Antibiotic Follow-up Note  Rachel [name] is a 82 y.o. year-old female admitted on 04/13/2015.  The patient is currently on day 4 of vancomycin for purulent cellulitis.  Assessment/Plan: Due to improving SCr, will adjust vancomycin from 500 mg q48h to 1g q48h.  Since 500 mg dose was already given early this AM, will order an additional 500 mg dose today then start 1g q48h on Wednesday.  F/u ortho consult recommendations after their evaluation today.  Temp (24hrs), Avg:98.2 F (36.8 C), Min:97.8 F (36.6 C), Max:98.5 F (36.9 C)   Recent Labs Lab 04/13/15 2006 04/14/15 0135 04/15/15 0546 04/16/15 0521  WBC 17.0* 14.0* 13.0* 13.7*    Recent Labs Lab 04/13/15 2006 04/14/15 0135 04/15/15 0546 04/16/15 0521  CREATININE 1.70* 1.52* 1.58* 1.32*   Estimated Creatinine Clearance: 22.6 mL/min (by C-G formula based on Cr of 1.32).    Allergies  Allergen Reactions  . Aspirin     Unknown reaction.     Antimicrobials this admission: 1/20 >> Vanc >>  Levels/dose changes this admission: 1/23 change vanc 500 mg q48h to 1g q48h for improving SCr  Microbiology Results: 1/20 BCx: ngtd x 1 day  Thank you for allowing pharmacy to be a part of this patient's care.  Hershal Coria PharmD 04/16/2015 11:32 AM

## 2015-04-16 NOTE — Progress Notes (Signed)
OT Cancellation Note  Patient Details Name: Raphael Coppin MRN: [id number] DOB: November 01, 1927   Cancelled Treatment:    Reason Eval/Treat Not Completed: Other (comment) Per chart pt is bed bound and grand daughter provides A as needed. CNA stated pt fed self breakfast with items placed within reach.  Granddaughter not present but sounds like pt is baseline. Will follow up with granddaughter prior to signing off.  Mickel Baas [place], [place] 04/16/2015, 10:57 AM

## 2015-04-16 NOTE — Consult Note (Signed)
WOC wound consult note Reason for Consult: Deep tissue pressure injury on right medial heel, LLE with edema and lateral LE with two areas of warmth, induration and erythema.  Left foot with 4 digits with discoloration consistent with blood flow issues Wound type: Pressure and infection vs circulatory disturbance Pressure Ulcer POA: Yes Measurement:Right heel, medial aspect:  3cm x 2cm intact, blood-filled blister with minimal fluctuance.  LLE with edema and warmth, lateral aspect as two areas of discoloration and induration, the proximal measures 5cm x 3cm and the distal (at the lateral malleolus) measures 3cm round.  The left foot presents with digits 2-5 appearing discolored. Wound bed: As described above Drainage (amount, consistency, odor) None Periwound: As described above, LLE is edematous with areas of discoloration Dressing procedure/placement/frequency: I have provided the Nursing Staff with guidance for care of the DTPI on the right medial heel and bilateral heel pressure redistribution boots.  The differential diagnosis of infection vs circulatory disruption was determined to be more indicative of infection.  Patient is on systemic antibiotics and ortho consult is to be performed later today.  Care of the Right LE over and above the pressure redistribution boots is outside the scope of [facility] practice at this time in the absence of a wound. [place] nursing team will not follow, but will remain available to this patient, the nursing and medical teams.  Please re-consult if needed. Thanks, Maudie Flakes, MSN, RN, [place], Arther Abbott  Pager# [phone]

## 2015-04-16 NOTE — Progress Notes (Signed)
PT Cancellation Note/late for 04/14/15  Patient Details Name: Rachel [name] MRN: [id number] DOB: 01/28/1928   Cancelled Treatment:    Reason Eval/Treat Not Completed: Patient not medically ready,patient noted to have pain during vascular testing, also note that patient bedbound  PTA. will F/u 1/23 for PT indication.   Claretha Cooper 04/16/2015, 8:39 AM Tresa Endo PT [phone]

## 2015-04-17 ENCOUNTER — Ambulatory Visit: Payer: Medicare HMO | Admitting: Nurse Practitioner

## 2015-04-17 LAB — BASIC METABOLIC PANEL
ANION GAP: 9 (ref 5–15)
BUN: 54 mg/dL — AB (ref 6–20)
CHLORIDE: 104 mmol/L (ref 101–111)
CO2: 25 mmol/L (ref 22–32)
Calcium: 9.6 mg/dL (ref 8.9–10.3)
Creatinine, Ser: 1.25 mg/dL — ABNORMAL HIGH (ref 0.44–1.00)
GFR calc Af Amer: 44 mL/min — ABNORMAL LOW (ref >60)
GFR calc non Af Amer: 38 mL/min — ABNORMAL LOW (ref >60)
GLUCOSE: 121 mg/dL — AB (ref 65–99)
POTASSIUM: 4.9 mmol/L (ref 3.5–5.1)
Sodium: 138 mmol/L (ref 135–145)

## 2015-04-17 LAB — CBC
HEMATOCRIT: 35.1 % — AB (ref 36.0–46.0)
HEMOGLOBIN: 11.2 g/dL — AB (ref 12.0–15.0)
MCH: 24.6 pg — ABNORMAL LOW (ref 26.0–34.0)
MCHC: 31.9 g/dL (ref 30.0–36.0)
MCV: 77 fL — ABNORMAL LOW (ref 78.0–100.0)
PLATELETS: 1107 10*3/uL — AB (ref 150–400)
RBC: 4.56 MIL/uL (ref 3.87–5.11)
RDW: 16.8 % — AB (ref 11.5–15.5)
WBC: 13.4 10*3/uL — AB (ref 4.0–10.5)

## 2015-04-17 LAB — GLUCOSE, CAPILLARY: Glucose-Capillary: 113 mg/dL — ABNORMAL HIGH (ref 65–99)

## 2015-04-17 MED ORDER — BISACODYL 5 MG PO TBEC: 5.0000 mg | DELAYED_RELEASE_TABLET | Freq: Every day | ORAL | Status: DC | PRN

## 2015-04-17 MED ORDER — HYDROCODONE-ACETAMINOPHEN 5-325 MG PO TABS: 1.0000 | ORAL_TABLET | ORAL | Status: DC | PRN

## 2015-04-17 MED ORDER — DOXYCYCLINE HYCLATE 100 MG PO TABS: 100.0000 mg | ORAL_TABLET | Freq: Two times a day (BID) | ORAL | Status: DC

## 2015-04-17 MED ORDER — SENNOSIDES-DOCUSATE SODIUM 8.6-50 MG PO TABS: 1.0000 | ORAL_TABLET | Freq: Every evening | ORAL | Status: AC | PRN

## 2015-04-17 MED ORDER — DOXYCYCLINE HYCLATE 100 MG PO TABS
100.0000 mg | ORAL_TABLET | Freq: Two times a day (BID) | ORAL | Status: DC
Start: 2015-04-17 — End: 2015-04-17
  Administered 2015-04-17: 100 mg via ORAL
  Filled 2015-04-17 (×2): qty 1

## 2015-04-17 NOTE — Discharge Instructions (Signed)
Cellulitis °Cellulitis is an infection of the skin and the tissue under the skin. The infected area is usually red and tender. This happens most often in the arms and lower legs. °HOME CARE  °· Take your antibiotic medicine as told. Finish the medicine even if you start to feel better. °· Keep the infected arm or leg raised (elevated). °· Put a warm cloth on the area up to 4 times per day. °· Only take medicines as told by your doctor. °· Keep all doctor visits as told. °GET HELP IF: °· You see red streaks on the skin coming from the infected area. °· Your red area gets bigger or turns a dark color. °· Your bone or joint under the infected area is painful after the skin heals. °· Your infection comes back in the same area or different area. °· You have a puffy (swollen) bump in the infected area. °· You have new symptoms. °· You have a fever. °GET HELP RIGHT AWAY IF:  °· You feel very sleepy. °· You throw up (vomit) or have watery poop (diarrhea). °· You feel sick and have muscle aches and pains. °  °This information is not intended to replace advice given to you by your health care provider. Make sure you discuss any questions you have with your health care provider. °  °Document Released: 08/27/2007 Document Revised: 11/29/2014 Document Reviewed: 05/26/2011 °Elsevier Interactive Patient Education ©2016 [employer] Inc[employer] ° °

## 2015-04-17 NOTE — Care Management Note (Signed)
Case Management Note  Patient Details  Name: Rachel [name] MRN: [id number] Date of Birth: [birth year]  Subjective/Objective:                  LLE swelling Action/Plan: Discharge planning Expected Discharge Date:  04/17/15               Expected Discharge Plan:  [place]  In-House Referral:     Discharge planning Services  CM Consult  Post Acute Care Choice:    Choice offered to:  Adult Children  DME Arranged:    DME Agency:     HH Arranged:  PT, OT, Nurse's Aide [place] Agency:  [place]  Status of Service:  Completed, signed off  Medicare Important Message Given:  Yes Date Medicare IM Given:    Medicare IM give by:    Date Additional Medicare IM Given:    Additional Medicare Important Message give by:     If discussed at [place] of Stay Meetings, dates discussed:    Additional Comments: CM received notice from RN of pt discharge.  Pt has dementia and is taken care of at home by granddaughter, [name] [phone] or [phone].  CM spoke with [name] who states she and her husband will come and pick up pt this afternoon.  [name] chooses [facility] to render HHPT/OT/RN/aide services.  Referral called to [facility] rep, Santiago Glad.  [name] states no DME needs.  No other CM needs were communicated. Dellie Catholic, RN 04/17/2015, 10:21 AM

## 2015-04-17 NOTE — Progress Notes (Signed)
Assessment unchanged.  Scripts were given per MD order.  All questions pertaining to D/C we answered.  Pt was D/C'd via wheelchair and accompanied by NT.

## 2015-04-17 NOTE — Progress Notes (Signed)
Physical Therapy Discharge Patient Details Name: Rachel [name] MRN: [id number] DOB: 07/28/1927 Today's Date: 04/17/2015 Time:  - [id number]     Patient discharged from PT services secondary to patient was total care PTA, as noted, HHPT can make any recommendations.   GP     Marcelino Freestone PT [phone]  04/17/2015, 1:38 PM

## 2015-04-17 NOTE — Progress Notes (Signed)
OT Cancellation Note  Patient Details Name: Rachel [name] MRN: [id number] DOB: 08/20/1927   Cancelled Treatment:    Reason Eval/Treat Not Completed: Other (comment).  Noted discharge order is written and HHPT and OT are ordered. Pt was cared for by granddaughter prior to admission.  Will defer OT to home setting.  Devun Anna 04/17/2015, 12:23 PM  Lesle Chris, OTR/L [phone] 04/17/2015

## 2015-04-17 NOTE — Discharge Summary (Addendum)
Physician Discharge Summary  Shianna Sorice [id number] DOB: Jul 21, 1927 DOA: 04/13/2015  PCP: Rachell Cipro, MD  Admit date: 04/13/2015 Discharge date: 04/17/2015  Recommendations for Outpatient Follow-up:  1. Pt will need to follow up with PCP in 2-3 weeks post discharge 2. Please obtain BMP to evaluate electrolytes and kidney function 3. Please also check CBC to evaluate Hg and Hct levels, Plt Count   Discharge Diagnoses:  Principal Problem:   Cellulitis and abscess of leg Active Problems:   Pressure ulcer of right heel, stage 4 (HCC)   Hypothyroidism   Acute on chronic kidney failure, stage II - III (HCC)   Thrombocytosis (HCC)   History of stroke in prior 3 months   Dementia  Discharge Condition: Stable  Diet recommendation: as tolerated with aspiration precautions    Brief narrative:    82 y.o. female with PMH of hypertension, hypothyroidism, dementia, and ischemic CVA last month with resulting left-sided deficits who presented to the ED with left lower leg swelling and pain. The patient is cared for at home by her granddaughter who had noticed a small bump on the lateral left calf approximately 3 days PTA, associated with swelling and erythema throughout the left calf and foot with interval increase in the size of a the original bump.   In ED, patient was found to be afebrile, saturating well on room air, and with vital signs stable. Initial blood work is notable for an elevated serum creatinine of 1.7, up from apparent baseline of 1.3. CBC notable for a leukocytosis to 17,000 and a thrombocytosis to 800,000. Patient was noted to have left lower extremity swelling and erythema and radiographs were obtained. X-ray demonstrated soft tissue edema without evidence for acute fracture or bony involvement. The hospitalists were consulted at this time and the patient will be admitted for ongoing evaluation and management of suspected purulent cellulitis of the left lower  leg.  Assessment/Plan:    1. Purulent cellulitis, left leg  - LLE with edema and lateral LE with two areas of warmth, induration and erythema, proximal 5cm x 3cm and the distal 3cm round - left foot presents with digits 2-5 discolored c/w peripheral vascular disease  - Fluctuant mass on lateral left leg, may be amenable to I&D, ultrasound with no clear evidence of DVT - study was difficult due to pt not being able to tolerate procedure  - Treated empirically with vancomycin given suspicion for MRSA and persistently elevated WBC - ortho consulted for assistance, cellulitis resolved and pt advised to complete therapy with doxycycline upon discharge   2. Acute on chronic kidney disease, stage II - III - SCr 1.7 on arrival, up from apparent baseline of 1.3  - 1.3 this AM, at baseline   3. Thrombocytosis  - Platelets 800 on admission  - This is chronic, trending up, needs outpatient follow up  4. Recent ischemic CVA - PT/OT eval done, pt will be going home - [facility] PT, RN, aid requested   5. Hypothyroidism - continue synthroid   6. Pressure ulcers - Deep tissue pressure injury on right medial heel 3cm x 2cm intact, blood-filled blister with minimal fluctuance  7. Severe PCM - per nutritionist evaluation, agree  Code Status: DNR Family Communication: plan of care discussed with the patient, POA over the phone  Disposition Plan: Home   IV access:  Peripheral IV  Procedures and diagnostic studies:   Dg Tibia/fibula Left 04/13/2015 Lateral soft tissue swelling. No evidence of fracture.   Dg Ankle Complete Left 04/13/2015  Lateral soft tissue swelling. No evidence of acute fracture or dislocation.   Medical Consultants:  Wound care team  Ortho   Other Consultants:  None  IAnti-Infectives:   Vancomycin 1/20 --> transitioned to oral doxycycline upon discharge      Discharge Exam: Filed Vitals:   04/16/15 2129 04/17/15 0600  BP: 169/81 161/80   Pulse: 117 103  Temp: 99.4 F (37.4 C) 97.3 F (36.3 C)  Resp: 18 16   Filed Vitals:   04/16/15 1400 04/16/15 1935 04/16/15 2129 04/17/15 0600  BP: 178/74  169/81 161/80  Pulse: 104  117 103  Temp: 98.9 F (37.2 C) 99.5 F (37.5 C) 99.4 F (37.4 C) 97.3 F (36.3 C)  TempSrc: Oral Axillary Oral Oral  Resp: 18  18 16   Height:      Weight:      SpO2: 98%  97% 98%    General: Pt is alert, follows commands appropriately, not in acute distress Cardiovascular: Regular rate and rhythm, S1/S2 +, no murmurs, no rubs, no gallops Respiratory: Clear to auscultation bilaterally, no wheezing, diminished breath sounds at bases  Abdominal: Soft, non tender, non distended, bowel sounds +, no guarding  Discharge Instructions  Discharge Instructions    Diet - low sodium heart healthy    Complete by:  As directed      Increase activity slowly    Complete by:  As directed             Medication List    TAKE these medications        bisacodyl 5 MG EC tablet  Commonly known as:  DULCOLAX  Take 1 tablet (5 mg total) by mouth daily as needed for moderate constipation.     clopidogrel 75 MG tablet  Commonly known as:  PLAVIX  Take 1 tablet (75 mg total) by mouth daily.     doxycycline 100 MG tablet  Commonly known as:  VIBRA-TABS  Take 1 tablet (100 mg total) by mouth every 12 (twelve) hours.     HYDROcodone-acetaminophen 5-325 MG tablet  Commonly known as:  NORCO/VICODIN  Take 1-2 tablets by mouth every 4 (four) hours as needed for moderate pain.     levothyroxine 25 MCG tablet  Commonly known as:  SYNTHROID, LEVOTHROID  Take 25 mcg by mouth daily before breakfast.     lisinopril 20 MG tablet  Commonly known as:  PRINIVIL,ZESTRIL  Take 20 mg by mouth daily.     multivitamin with minerals Tabs tablet  Take 1 tablet by mouth daily.     pravastatin 20 MG tablet  Commonly known as:  PRAVACHOL  Take 1 tablet (20 mg total) by mouth daily at 6 PM.     senna-docusate 8.6-50  MG tablet  Commonly known as:  Senokot-S  Take 1 tablet by mouth at bedtime as needed for mild constipation.     vitamin A & D ointment  Apply 1 application topically as needed (with every adult brief change.).     Vitamin D3 5000 units Caps  Take 5,000 Units by mouth daily.            Follow-up Information    Follow up with [facility], MD.   Specialty:  Family Medicine   Contact information:   [address] [place] [address] [phone]        The results of significant diagnostics from this hospitalization (including imaging, microbiology, ancillary and laboratory) are listed below for reference.  Microbiology: Recent Results (from the past 240 hour(s))  Culture, blood (routine x 2)     Status: None (Preliminary result)   Collection Time: 04/13/15 10:50 PM  Result Value Ref Range Status   Specimen Description BLOOD RIGHT ARM  Final   Special Requests BOTTLES DRAWN AEROBIC AND ANAEROBIC 5 CC  Final   Culture   Final    NO GROWTH 2 DAYS Performed at [facility]    Report Status PENDING  Incomplete  Culture, blood (routine x 2)     Status: None (Preliminary result)   Collection Time: 04/13/15 10:55 PM  Result Value Ref Range Status   Specimen Description BLOOD LEFT ARM  Final   Special Requests 5 CC AEROBIC BOTTLE ONLY  Final   Culture   Final    NO GROWTH 2 DAYS Performed at [facility]    Report Status PENDING  Incomplete     Labs: Basic Metabolic Panel:  Recent Labs Lab 04/13/15 2006 04/14/15 0135 04/15/15 0546 04/16/15 0521 04/17/15 0545  NA 143 142 144 140 138  K 4.7 4.0 4.2 4.8 4.9  CL 109 109 111 107 104  CO2 23 23 24 24 25   GLUCOSE 124* 142* 125* 127* 121*  BUN 47* 49* 49* 48* 54*  CREATININE 1.70* 1.52* 1.58* 1.32* 1.25*  CALCIUM 9.8 9.6 9.5 9.4 9.6   Liver Function Tests:  Recent Labs Lab 04/13/15 2006  AST 25  ALT 18  ALKPHOS 80  BILITOT 0.4  PROT 7.4  ALBUMIN 3.3*   No results for  input(s): LIPASE, AMYLASE in the last 168 hours. No results for input(s): AMMONIA in the last 168 hours. CBC:  Recent Labs Lab 04/13/15 2006 04/14/15 0135 04/15/15 0546 04/16/15 0521 04/17/15 0545  WBC 17.0* 14.0* 13.0* 13.7* 13.4*  NEUTROABS 13.4* 11.0*  --   --   --   HGB 13.9 12.2 11.9* 11.4* 11.2*  HCT 40.9 38.2 36.5 34.7* 35.1*  MCV 76.0* 76.6* 75.7* 75.3* 77.0*  PLT 806* 851* 882* 976* 1107*    CBG:  Recent Labs Lab 04/14/15 0758 04/15/15 0801 04/16/15 0756 04/17/15 0718  GLUCAP 81 103* 100* 113*    SIGNED: Time coordinating discharge: 30 minutes  MAGICK-Simon Llamas, MD  [facility] 04/17/2015, 8:42 AM Pager [phone]  If 7PM-7AM, please contact night-coverage [email or web address] Password [email or web address]

## 2015-04-18 ENCOUNTER — Encounter: Payer: Self-pay | Admitting: Nurse Practitioner

## 2015-04-18 DIAGNOSIS — N179 Acute kidney failure, unspecified: Secondary | ICD-10-CM | POA: Diagnosis not present

## 2015-04-18 DIAGNOSIS — I69354 Hemiplegia and hemiparesis following cerebral infarction affecting left non-dominant side: Secondary | ICD-10-CM | POA: Diagnosis not present

## 2015-04-18 DIAGNOSIS — Z7901 Long term (current) use of anticoagulants: Secondary | ICD-10-CM | POA: Diagnosis not present

## 2015-04-18 DIAGNOSIS — R4781 Slurred speech: Secondary | ICD-10-CM | POA: Diagnosis not present

## 2015-04-18 DIAGNOSIS — E785 Hyperlipidemia, unspecified: Secondary | ICD-10-CM | POA: Diagnosis not present

## 2015-04-18 DIAGNOSIS — E039 Hypothyroidism, unspecified: Secondary | ICD-10-CM | POA: Diagnosis not present

## 2015-04-18 DIAGNOSIS — G35 Multiple sclerosis: Secondary | ICD-10-CM | POA: Diagnosis not present

## 2015-04-18 DIAGNOSIS — I2510 Atherosclerotic heart disease of native coronary artery without angina pectoris: Secondary | ICD-10-CM | POA: Diagnosis not present

## 2015-04-18 DIAGNOSIS — R69 Illness, unspecified: Secondary | ICD-10-CM | POA: Diagnosis not present

## 2015-04-19 DIAGNOSIS — Z7901 Long term (current) use of anticoagulants: Secondary | ICD-10-CM | POA: Diagnosis not present

## 2015-04-19 DIAGNOSIS — N179 Acute kidney failure, unspecified: Secondary | ICD-10-CM | POA: Diagnosis not present

## 2015-04-19 DIAGNOSIS — R4781 Slurred speech: Secondary | ICD-10-CM | POA: Diagnosis not present

## 2015-04-19 DIAGNOSIS — G35 Multiple sclerosis: Secondary | ICD-10-CM | POA: Diagnosis not present

## 2015-04-19 DIAGNOSIS — I2510 Atherosclerotic heart disease of native coronary artery without angina pectoris: Secondary | ICD-10-CM | POA: Diagnosis not present

## 2015-04-19 DIAGNOSIS — E785 Hyperlipidemia, unspecified: Secondary | ICD-10-CM | POA: Diagnosis not present

## 2015-04-19 DIAGNOSIS — E039 Hypothyroidism, unspecified: Secondary | ICD-10-CM | POA: Diagnosis not present

## 2015-04-19 DIAGNOSIS — I69354 Hemiplegia and hemiparesis following cerebral infarction affecting left non-dominant side: Secondary | ICD-10-CM | POA: Diagnosis not present

## 2015-04-19 DIAGNOSIS — R69 Illness, unspecified: Secondary | ICD-10-CM | POA: Diagnosis not present

## 2015-04-19 LAB — CULTURE, BLOOD (ROUTINE X 2)
Culture: NO GROWTH
Culture: NO GROWTH
Special Requests: 5

## 2015-04-21 ENCOUNTER — Inpatient Hospital Stay (HOSPITAL_COMMUNITY)
Admission: EM | Admit: 2015-04-21 | Discharge: 2015-04-29 | DRG: 239 | Disposition: A | Discharge status: Home Health | Payer: Medicare HMO | Attending: Internal Medicine | Admitting: Internal Medicine

## 2015-04-21 ENCOUNTER — Encounter (HOSPITAL_COMMUNITY): Payer: Self-pay | Admitting: Emergency Medicine

## 2015-04-21 DIAGNOSIS — Z681 Body mass index (BMI) 19 or less, adult: Secondary | ICD-10-CM | POA: Diagnosis not present

## 2015-04-21 DIAGNOSIS — I998 Other disorder of circulatory system: Secondary | ICD-10-CM | POA: Diagnosis not present

## 2015-04-21 DIAGNOSIS — L03116 Cellulitis of left lower limb: Secondary | ICD-10-CM | POA: Diagnosis present

## 2015-04-21 DIAGNOSIS — G35 Multiple sclerosis: Secondary | ICD-10-CM | POA: Diagnosis present

## 2015-04-21 DIAGNOSIS — D72829 Elevated white blood cell count, unspecified: Secondary | ICD-10-CM

## 2015-04-21 DIAGNOSIS — I129 Hypertensive chronic kidney disease with stage 1 through stage 4 chronic kidney disease, or unspecified chronic kidney disease: Secondary | ICD-10-CM | POA: Diagnosis present

## 2015-04-21 DIAGNOSIS — E785 Hyperlipidemia, unspecified: Secondary | ICD-10-CM | POA: Diagnosis present

## 2015-04-21 DIAGNOSIS — N183 Chronic kidney disease, stage 3 unspecified: Secondary | ICD-10-CM | POA: Diagnosis present

## 2015-04-21 DIAGNOSIS — E875 Hyperkalemia: Secondary | ICD-10-CM | POA: Diagnosis present

## 2015-04-21 DIAGNOSIS — M2450 Contracture, unspecified joint: Secondary | ICD-10-CM | POA: Diagnosis present

## 2015-04-21 DIAGNOSIS — N189 Chronic kidney disease, unspecified: Secondary | ICD-10-CM | POA: Diagnosis not present

## 2015-04-21 DIAGNOSIS — G92 Toxic encephalopathy: Secondary | ICD-10-CM | POA: Diagnosis not present

## 2015-04-21 DIAGNOSIS — Z96643 Presence of artificial hip joint, bilateral: Secondary | ICD-10-CM | POA: Diagnosis present

## 2015-04-21 DIAGNOSIS — I96 Gangrene, not elsewhere classified: Secondary | ICD-10-CM | POA: Diagnosis present

## 2015-04-21 DIAGNOSIS — D75839 Thrombocytosis, unspecified: Secondary | ICD-10-CM | POA: Diagnosis present

## 2015-04-21 DIAGNOSIS — L97129 Non-pressure chronic ulcer of left thigh with unspecified severity: Secondary | ICD-10-CM | POA: Diagnosis not present

## 2015-04-21 DIAGNOSIS — I739 Peripheral vascular disease, unspecified: Secondary | ICD-10-CM | POA: Diagnosis present

## 2015-04-21 DIAGNOSIS — N179 Acute kidney failure, unspecified: Secondary | ICD-10-CM | POA: Diagnosis present

## 2015-04-21 DIAGNOSIS — E039 Hypothyroidism, unspecified: Secondary | ICD-10-CM | POA: Diagnosis present

## 2015-04-21 DIAGNOSIS — D473 Essential (hemorrhagic) thrombocythemia: Secondary | ICD-10-CM | POA: Diagnosis present

## 2015-04-21 DIAGNOSIS — M79672 Pain in left foot: Secondary | ICD-10-CM | POA: Diagnosis not present

## 2015-04-21 DIAGNOSIS — E43 Unspecified severe protein-calorie malnutrition: Secondary | ICD-10-CM | POA: Diagnosis not present

## 2015-04-21 DIAGNOSIS — I70262 Atherosclerosis of native arteries of extremities with gangrene, left leg: Secondary | ICD-10-CM | POA: Diagnosis not present

## 2015-04-21 DIAGNOSIS — Z515 Encounter for palliative care: Secondary | ICD-10-CM | POA: Diagnosis present

## 2015-04-21 DIAGNOSIS — Z993 Dependence on wheelchair: Secondary | ICD-10-CM

## 2015-04-21 DIAGNOSIS — L89614 Pressure ulcer of right heel, stage 4: Secondary | ICD-10-CM | POA: Diagnosis not present

## 2015-04-21 DIAGNOSIS — L89619 Pressure ulcer of right heel, unspecified stage: Secondary | ICD-10-CM | POA: Diagnosis present

## 2015-04-21 DIAGNOSIS — F0390 Unspecified dementia without behavioral disturbance: Secondary | ICD-10-CM | POA: Diagnosis present

## 2015-04-21 DIAGNOSIS — D509 Iron deficiency anemia, unspecified: Secondary | ICD-10-CM | POA: Diagnosis present

## 2015-04-21 DIAGNOSIS — Z7401 Bed confinement status: Secondary | ICD-10-CM

## 2015-04-21 DIAGNOSIS — Z7902 Long term (current) use of antithrombotics/antiplatelets: Secondary | ICD-10-CM

## 2015-04-21 DIAGNOSIS — Z66 Do not resuscitate: Secondary | ICD-10-CM | POA: Diagnosis present

## 2015-04-21 DIAGNOSIS — L02419 Cutaneous abscess of limb, unspecified: Secondary | ICD-10-CM | POA: Diagnosis not present

## 2015-04-21 DIAGNOSIS — T41205A Adverse effect of unspecified general anesthetics, initial encounter: Secondary | ICD-10-CM | POA: Diagnosis not present

## 2015-04-21 DIAGNOSIS — Z886 Allergy status to analgesic agent status: Secondary | ICD-10-CM

## 2015-04-21 DIAGNOSIS — Z7189 Other specified counseling: Secondary | ICD-10-CM | POA: Diagnosis not present

## 2015-04-21 DIAGNOSIS — Z8669 Personal history of other diseases of the nervous system and sense organs: Secondary | ICD-10-CM | POA: Diagnosis not present

## 2015-04-21 DIAGNOSIS — Z8673 Personal history of transient ischemic attack (TIA), and cerebral infarction without residual deficits: Secondary | ICD-10-CM

## 2015-04-21 DIAGNOSIS — L8990 Pressure ulcer of unspecified site, unspecified stage: Secondary | ICD-10-CM | POA: Diagnosis present

## 2015-04-21 DIAGNOSIS — R69 Illness, unspecified: Secondary | ICD-10-CM | POA: Diagnosis not present

## 2015-04-21 DIAGNOSIS — S81839A Puncture wound without foreign body, unspecified lower leg, initial encounter: Secondary | ICD-10-CM | POA: Diagnosis not present

## 2015-04-21 HISTORY — DX: Pressure ulcer of right heel, stage 4: L89.614

## 2015-04-21 HISTORY — DX: Hypothyroidism, unspecified: E03.9

## 2015-04-21 HISTORY — DX: Hyperlipidemia, unspecified: E78.5

## 2015-04-21 HISTORY — DX: Essential (hemorrhagic) thrombocythemia: D47.3

## 2015-04-21 HISTORY — DX: Unspecified severe protein-calorie malnutrition: E43

## 2015-04-21 LAB — CBC WITH DIFFERENTIAL/PLATELET
BASOS PCT: 0 %
Basophils Absolute: 0 10*3/uL (ref 0.0–0.1)
EOS ABS: 0.2 10*3/uL (ref 0.0–0.7)
Eosinophils Relative: 1 %
HCT: 35.4 % — ABNORMAL LOW (ref 36.0–46.0)
Hemoglobin: 11.5 g/dL — ABNORMAL LOW (ref 12.0–15.0)
LYMPHS PCT: 9 %
Lymphs Abs: 1.4 10*3/uL (ref 0.7–4.0)
MCH: 24.7 pg — ABNORMAL LOW (ref 26.0–34.0)
MCHC: 32.5 g/dL (ref 30.0–36.0)
MCV: 76.1 fL — AB (ref 78.0–100.0)
Monocytes Absolute: 1.1 10*3/uL — ABNORMAL HIGH (ref 0.1–1.0)
Monocytes Relative: 7 %
NEUTROS PCT: 83 %
Neutro Abs: 13.4 10*3/uL — ABNORMAL HIGH (ref 1.7–7.7)
PLATELETS: 1158 10*3/uL — AB (ref 150–400)
RBC: 4.65 MIL/uL (ref 3.87–5.11)
RDW: 17.2 % — ABNORMAL HIGH (ref 11.5–15.5)
WBC: 16.1 10*3/uL — AB (ref 4.0–10.5)

## 2015-04-21 LAB — COMPREHENSIVE METABOLIC PANEL
ALT: 35 U/L (ref 14–54)
AST: 68 U/L — AB (ref 15–41)
Albumin: 2.8 g/dL — ABNORMAL LOW (ref 3.5–5.0)
Alkaline Phosphatase: 72 U/L (ref 38–126)
Anion gap: 11 (ref 5–15)
BUN: 54 mg/dL — AB (ref 6–20)
CHLORIDE: 106 mmol/L (ref 101–111)
CO2: 26 mmol/L (ref 22–32)
CREATININE: 1.82 mg/dL — AB (ref 0.44–1.00)
Calcium: 9.4 mg/dL (ref 8.9–10.3)
GFR calc Af Amer: 28 mL/min — ABNORMAL LOW (ref >60)
GFR, EST NON AFRICAN AMERICAN: 24 mL/min — AB (ref >60)
Glucose, Bld: 117 mg/dL — ABNORMAL HIGH (ref 65–99)
Potassium: 4 mmol/L (ref 3.5–5.1)
Sodium: 143 mmol/L (ref 135–145)
Total Bilirubin: 0.5 mg/dL (ref 0.3–1.2)
Total Protein: 6.7 g/dL (ref 6.5–8.1)

## 2015-04-21 LAB — PROTIME-INR
INR: 1.28 (ref 0.00–1.49)
PROTHROMBIN TIME: 15.6 s — AB (ref 11.6–15.2)

## 2015-04-21 LAB — APTT: APTT: 29 s (ref 24–37)

## 2015-04-21 LAB — TSH: TSH: 13.958 u[IU]/mL — ABNORMAL HIGH (ref 0.350–4.500)

## 2015-04-21 LAB — HEPARIN LEVEL (UNFRACTIONATED)
HEPARIN UNFRACTIONATED: 0.27 [IU]/mL — AB (ref 0.30–0.70)
Heparin Unfractionated: <0.1 IU/mL — ABNORMAL LOW (ref 0.30–0.70)

## 2015-04-21 LAB — I-STAT CG4 LACTIC ACID, ED: LACTIC ACID, VENOUS: 1.53 mmol/L (ref 0.5–2.0)

## 2015-04-21 MED ORDER — SODIUM CHLORIDE 0.9% FLUSH
3.0000 mL | Freq: Two times a day (BID) | INTRAVENOUS | Status: DC
  Administered 2015-04-24 – 2015-04-27 (×2): 3 mL via INTRAVENOUS

## 2015-04-21 MED ORDER — ALUM & MAG HYDROXIDE-SIMETH 200-200-20 MG/5ML PO SUSP: 30.0000 mL | Freq: Four times a day (QID) | ORAL | Status: DC | PRN

## 2015-04-21 MED ORDER — DEXTROSE 50 % IV SOLN
1.0000 | Freq: Once | INTRAVENOUS | Status: AC
  Administered 2015-04-21: 50 mL via INTRAVENOUS
  Filled 2015-04-21: qty 50

## 2015-04-21 MED ORDER — ACETAMINOPHEN 650 MG RE SUPP: 650.0000 mg | Freq: Four times a day (QID) | RECTAL | Status: DC | PRN

## 2015-04-21 MED ORDER — LEVOTHYROXINE SODIUM 25 MCG PO TABS
25.0000 ug | ORAL_TABLET | Freq: Every day | ORAL | Status: DC
  Administered 2015-04-22 – 2015-04-29 (×7): 25 ug via ORAL
  Filled 2015-04-21 (×7): qty 1

## 2015-04-21 MED ORDER — VITAMIN D 1000 UNITS PO TABS
5000.0000 [IU] | ORAL_TABLET | Freq: Every day | ORAL | Status: DC
  Administered 2015-04-22 – 2015-04-29 (×7): 5000 [IU] via ORAL
  Filled 2015-04-21 (×8): qty 5

## 2015-04-21 MED ORDER — SODIUM CHLORIDE 0.9 % IV SOLN
INTRAVENOUS | Status: DC
  Administered 2015-04-22 – 2015-04-25 (×6): via INTRAVENOUS

## 2015-04-21 MED ORDER — SENNOSIDES-DOCUSATE SODIUM 8.6-50 MG PO TABS: 1.0000 | ORAL_TABLET | Freq: Every evening | ORAL | Status: DC | PRN

## 2015-04-21 MED ORDER — CLOPIDOGREL BISULFATE 75 MG PO TABS
75.0000 mg | ORAL_TABLET | Freq: Every day | ORAL | Status: DC
  Administered 2015-04-22 – 2015-04-24 (×3): 75 mg via ORAL
  Filled 2015-04-21 (×3): qty 1

## 2015-04-21 MED ORDER — BISACODYL 5 MG PO TBEC: 5.0000 mg | DELAYED_RELEASE_TABLET | Freq: Every day | ORAL | Status: DC | PRN

## 2015-04-21 MED ORDER — ADULT MULTIVITAMIN W/MINERALS CH
1.0000 | ORAL_TABLET | Freq: Every day | ORAL | Status: DC
  Administered 2015-04-22 – 2015-04-29 (×7): 1 via ORAL
  Filled 2015-04-21 (×8): qty 1

## 2015-04-21 MED ORDER — SODIUM BICARBONATE 8.4 % IV SOLN
50.0000 meq | Freq: Once | INTRAVENOUS | Status: AC
  Administered 2015-04-21: 50 meq via INTRAVENOUS
  Filled 2015-04-21: qty 50

## 2015-04-21 MED ORDER — PRAVASTATIN SODIUM 20 MG PO TABS
20.0000 mg | ORAL_TABLET | Freq: Every day | ORAL | Status: DC
  Administered 2015-04-23 – 2015-04-28 (×6): 20 mg via ORAL
  Filled 2015-04-21 (×8): qty 1

## 2015-04-21 MED ORDER — HEPARIN (PORCINE) IN NACL 100-0.45 UNIT/ML-% IJ SOLN
1400.0000 [IU]/h | INTRAMUSCULAR | Status: DC
  Administered 2015-04-21 (×2): 750 [IU]/h via INTRAVENOUS
  Administered 2015-04-23: 1250 [IU]/h via INTRAVENOUS
  Administered 2015-04-24: 1400 [IU]/h via INTRAVENOUS
  Filled 2015-04-21 (×4): qty 250

## 2015-04-21 MED ORDER — HEPARIN BOLUS VIA INFUSION
2000.0000 [IU] | Freq: Once | INTRAVENOUS | Status: AC
Start: 2015-04-21 — End: 2015-04-21
  Administered 2015-04-21: 2000 [IU] via INTRAVENOUS
  Filled 2015-04-21: qty 2000

## 2015-04-21 MED ORDER — ALBUTEROL SULFATE (2.5 MG/3ML) 0.083% IN NEBU: 10.0000 mg | INHALATION_SOLUTION | Freq: Once | RESPIRATORY_TRACT | Status: DC

## 2015-04-21 MED ORDER — INSULIN ASPART 100 UNIT/ML IV SOLN
10.0000 [IU] | Freq: Once | INTRAVENOUS | Status: AC
  Administered 2015-04-21: 10 [IU] via INTRAVENOUS
  Filled 2015-04-21: qty 0.1

## 2015-04-21 MED ORDER — ACETAMINOPHEN 325 MG PO TABS
650.0000 mg | ORAL_TABLET | Freq: Four times a day (QID) | ORAL | Status: DC | PRN
  Administered 2015-04-24 – 2015-04-28 (×2): 650 mg via ORAL
  Filled 2015-04-21 (×2): qty 2

## 2015-04-21 MED ORDER — ONDANSETRON HCL 4 MG PO TABS: 4.0000 mg | ORAL_TABLET | Freq: Four times a day (QID) | ORAL | Status: DC | PRN

## 2015-04-21 MED ORDER — OXYCODONE HCL 5 MG PO TABS
5.0000 mg | ORAL_TABLET | ORAL | Status: DC | PRN
  Administered 2015-04-22 – 2015-04-23 (×2): 5 mg via ORAL
  Filled 2015-04-21 (×2): qty 1

## 2015-04-21 MED ORDER — HYDROCODONE-ACETAMINOPHEN 5-325 MG PO TABS
1.0000 | ORAL_TABLET | ORAL | Status: DC | PRN
  Administered 2015-04-25 – 2015-04-29 (×5): 1 via ORAL
  Filled 2015-04-21 (×5): qty 1

## 2015-04-21 MED ORDER — MORPHINE SULFATE (PF) 4 MG/ML IV SOLN: 4.0000 mg | INTRAVENOUS | Status: DC | PRN

## 2015-04-21 MED ORDER — ONDANSETRON HCL 4 MG/2ML IJ SOLN: 4.0000 mg | Freq: Four times a day (QID) | INTRAMUSCULAR | Status: DC | PRN

## 2015-04-21 NOTE — Progress Notes (Signed)
ANTICOAGULATION CONSULT NOTE - Initial Consult  Pharmacy Consult for IV Heparin Indication: Ischemic foot  Allergies  Allergen Reactions  . Aspirin     Unknown reaction.     Patient Measurements:   Wt=45.9 kg  Vital Signs: Temp: 98 F (36.7 C) (01/28 0912) Temp Source: Oral (01/28 0912) BP: 130/73 mmHg (01/28 0912) Pulse Rate: 75 (01/28 0912)  Labs: No results for input(s): HGB, HCT, PLT, APTT, LABPROT, INR, HEPARINUNFRC, HEPRLOWMOCWT, CREATININE, CKTOTAL, CKMB, TROPONINI in the last 72 hours.  Estimated Creatinine Clearance: 23.8 mL/min (by C-G formula based on Cr of 1.25).   Medical History: Past Medical History  Diagnosis Date  . Dementia   . Hypertension   . Stroke [facility])     Medications:  Scheduled:  Infusions:   Assessment: 45 yoF c/o L foot changing color.  IV Heparin per Rx for ischemic foot.  Goal of Therapy:  Heparin level 0.3-0.7 units/ml Monitor platelets by anticoagulation protocol: Yes   Plan:   Baseline coags stat  Heparin 2000 unit bolus x1 then drip @ 750 units/hr  Daily CBC/HL  Check 1st HL in 8 hours  Lawana Pai R 04/21/2015,9:49 AM

## 2015-04-21 NOTE — ED Notes (Signed)
Rama at bedside.

## 2015-04-21 NOTE — Progress Notes (Signed)
[place] for IV Heparin Indication: Ischemic foot  Allergies  Allergen Reactions  . Aspirin     Unknown reaction.     Patient Measurements: Height: 5\' 4"  (162.6 cm) Weight: 101 lb (45.813 kg) IBW/kg (Calculated) : 54.7 Wt=45.9 kg  Vital Signs: Temp: 98.5 F (36.9 C) (01/28 1943) Temp Source: Oral (01/28 1943) BP: 124/31 mmHg (01/28 1943) Pulse Rate: 77 (01/28 1943)  Labs:  Recent Labs  04/21/15 1006 04/21/15 1012 04/21/15 1105 04/21/15 1106 04/21/15 1848  HGB 11.5* 13.6  --   --   --   HCT 35.4* 40.0  --   --   --   PLT 1158*  --   --   --   --   APTT 29  --   --   --   --   LABPROT 15.6*  --   --   --   --   INR 1.28  --   --   --   --   HEPARINUNFRC  --   --  0.27*  --  <0.10*  CREATININE  --  1.60*  --  1.82*  --     Estimated Creatinine Clearance: 16.3 mL/min (by C-G formula based on Cr of 1.82).   Medical History: Past Medical History  Diagnosis Date  . Dementia   . Hypertension   . Stroke [facility])     Left sided weakness  . Multiple sclerosis ([place])   . Hyperlipidemia   . Hypothyroidism   . Acute on chronic kidney failure, stage II - III ([place]) 04/13/2015  . Cellulitis and abscess of leg 04/13/2015  . Thrombocytosis ([place]) 04/13/2015  . Pressure ulcer of right heel, stage 4 ([place]) 04/15/2015  . Severe protein-calorie malnutrition (HCC)     Medications:  Scheduled:  Infusions:   Assessment: 52 yoF c/o L foot changing color.  IV Heparin per Rx for ischemic foot. Vascular is seeing pt, may not be able to perform revascularization in this patient. This evening's heparin level is undetectable, I spoke with the nurse and confirmed that the infusion has been running appropriately and was not stopped during transport.   Goal of Therapy:  Heparin level 0.3-0.7 units/ml Monitor platelets by anticoagulation protocol: Yes   Plan:  -Increase heparin to 950 units/hr -Daily HL, CBC -Monitor s/sx bleeding -Check  confirmatory level     Harvel Quale 04/21/2015,7:55 PM

## 2015-04-21 NOTE — Progress Notes (Signed)
Patient transferred from [facility] to [facility]. She was seen by vascular surgery to discuss treatment options with the patient and recommended a palliative care consult to help the patient and family make a decision. I placed a palliative care consult and have advanced her to a dysphagia 3 diet. She had some mild hyperkalemia on her previous blood work however her repeat BMP demonstrates a normal potassium. I suspect that her previous BMP was somewhat hemolyzed or was an accurate point-of-care lab.

## 2015-04-21 NOTE — ED Notes (Signed)
Family at bedside reports noting cold and colorless left foot last night around 2100; reports using hot compress as directed on recent discharge instructions and called provider. Floyd at bedside.

## 2015-04-21 NOTE — ED Provider Notes (Signed)
CSN: [id number]     Arrival date & time 04/21/15  [id number] History   First MD Initiated Contact with Patient 04/21/15 0901     Chief Complaint  Patient presents with  . Foot Injury     (Consider location/radiation/quality/duration/timing/severity/associated sxs/prior Treatment) Patient is a 82 y.o. female presenting with lower extremity pain. The history is provided by the patient.  Foot Pain This is a new problem. The current episode started less than 1 hour ago. The problem occurs constantly. The problem has not changed since onset.Pertinent negatives include no chest pain, no abdominal pain, no headaches and no shortness of breath. Nothing aggravates the symptoms. Nothing relieves the symptoms. She has tried nothing for the symptoms. The treatment provided no relief.    82 yo F with a chief complaint of a color change to her left foot. This was noticed yesterday. She was recently here for was thought to be cellulitis but was treated with antibiotics and had improvement. Per the daughter the color change Better after she massage the foot but worsened again this morning. Patient is demented and chronically bedbound is unable to verbalize whether or not her foot is bothering her.  Past Medical History  Diagnosis Date  . Dementia   . Hypertension   . Stroke [facility])     Left sided weakness  . Multiple sclerosis ([place])   . Hyperlipidemia   . Hypothyroidism    Past Surgical History  Procedure Laterality Date  . Total hip arthroplasty Right   . Total hip arthroplasty Left    Family History  Problem Relation Age of Onset  . Obesity Other   . Heart attack Brother    Social History  Substance Use Topics  . Smoking status: Never Smoker   . Smokeless tobacco: Never Used  . Alcohol Use: No   OB History    No data available     Review of Systems  Constitutional: Negative for fever and chills.  HENT: Negative for congestion and rhinorrhea.   Eyes: Negative for redness and visual  disturbance.  Respiratory: Negative for shortness of breath and wheezing.   Cardiovascular: Positive for leg swelling. Negative for chest pain and palpitations.  Gastrointestinal: Negative for nausea, vomiting and abdominal pain.  Genitourinary: Negative for dysuria and urgency.  Musculoskeletal: Negative for myalgias and arthralgias.  Skin: Positive for color change. Negative for pallor and wound.  Neurological: Negative for dizziness and headaches.      Allergies  Aspirin  Home Medications   Prior to Admission medications   Medication Sig Start Date End Date Taking? Authorizing Provider  bisacodyl (DULCOLAX) 5 MG EC tablet Take 1 tablet (5 mg total) by mouth daily as needed for moderate constipation. 04/17/15  Yes Theodis Blaze, MD  Cholecalciferol (VITAMIN D3) 5000 UNITS CAPS Take 5,000 Units by mouth daily.   Yes Historical Provider, MD  clopidogrel (PLAVIX) 75 MG tablet Take 1 tablet (75 mg total) by mouth daily. 03/02/15  Yes Charlynne Cousins, MD  doxycycline (VIBRA-TABS) 100 MG tablet Take 1 tablet (100 mg total) by mouth every 12 (twelve) hours. 04/17/15  Yes Theodis Blaze, MD  HYDROcodone-acetaminophen (NORCO/VICODIN) 5-325 MG tablet Take 1-2 tablets by mouth every 4 (four) hours as needed for moderate pain. 04/17/15  Yes Theodis Blaze, MD  lisinopril (PRINIVIL,ZESTRIL) 20 MG tablet Take 20 mg by mouth daily.   Yes Historical Provider, MD  Multiple Vitamin (MULTIVITAMIN WITH MINERALS) TABS tablet Take 1 tablet by mouth daily.   Yes  Historical Provider, MD  pravastatin (PRAVACHOL) 20 MG tablet Take 1 tablet (20 mg total) by mouth daily at 6 PM. 03/02/15  Yes Charlynne Cousins, MD  senna-docusate (SENOKOT-S) 8.6-50 MG tablet Take 1 tablet by mouth at bedtime as needed for mild constipation. 04/17/15  Yes Theodis Blaze, MD  Vitamins A & D (VITAMIN A & D) ointment Apply 1 application topically as needed (with every adult brief change.).   Yes Historical Provider, MD  levothyroxine  (SYNTHROID, LEVOTHROID) 25 MCG tablet Take 25 mcg by mouth daily before breakfast.    Historical Provider, MD   BP 130/55 mmHg  Pulse 67  Temp(Src) 98.8 F (37.1 C) (Oral)  Resp 18  Ht 5\' 4"  (1.626 m)  Wt 101 lb (45.813 kg)  BMI 17.33 kg/m2  SpO2 97% Physical Exam  Constitutional: She is oriented to person, place, and time. No distress.  Chronically ill-appearing patient is bedbound feet are held in plantar flexion contractured.  HENT:  Head: Normocephalic and atraumatic.  Eyes: EOM are normal. Pupils are equal, round, and reactive to light.  Neck: Normal range of motion. Neck supple.  Cardiovascular: Normal rate and regular rhythm.  Exam reveals no gallop and no friction rub.   No murmur heard. Pulmonary/Chest: Effort normal. She has no wheezes. She has no rales.  Abdominal: Soft. She exhibits no distension. There is no tenderness.  Musculoskeletal: She exhibits edema. She exhibits no tenderness.  No noted palpable pulses to either foot. Not dopplerable. Monophasic popliteal pulse on the left. Triphasic femoral. Dusky cold left foot compared to right.  Neurological: She is alert and oriented to person, place, and time.  Skin: Skin is warm and dry. She is not diaphoretic.  Psychiatric: She has a normal mood and affect. Her behavior is normal.  Nursing note and vitals reviewed.       ED Course  Procedures (including critical care time) Labs Review Labs Reviewed  I-STAT CHEM 8, ED - Abnormal; Notable for the following:    Potassium 6.7 (*)    BUN 75 (*)    Creatinine, Ser 1.60 (*)    Glucose, Bld 114 (*)    Calcium, Ion 1.11 (*)    All other components within normal limits  CBC WITH DIFFERENTIAL/PLATELET  APTT  PROTIME-INR  COMPREHENSIVE METABOLIC PANEL  I-STAT CG4 LACTIC ACID, ED    Imaging Review No results found. I have personally reviewed and evaluated these images and lab results as part of my medical decision-making.   EKG Interpretation   Date/Time:   Saturday April 21 2015 09:42:36 EST Ventricular Rate:  73 PR Interval:  78 QRS Duration: 76 QT Interval:  404 QTC Calculation: 445 R Axis:   45 Text Interpretation:  Sinus or ectopic atrial rhythm Atrial premature  complex Short PR interval Anteroseptal infarct, old No significant change  since last tracing Confirmed by Gedeon Brandow MD, DANIEL [id number]) on 04/21/2015  10:04:37 AM      MDM   Final diagnoses:  Ischemic foot    82 yo F with a chief complaint of a left foot color change. Appears to be an ischemic foot. Discussed the case with Dr. Donnetta Hutching, vascular surgery. Recommended transfer to [facility] for admission where he will evaluate the patient. Recommended new imaging studies at this time as patient is bedbound and this would not likely change her disposition. Patient was started on heparin. Discussed with the hospitalist for admission and transfer.  CRITICAL CARE Performed by: Cecilio Asper   Total critical  care time: 50 minutes  Critical care time was exclusive of separately billable procedures and treating other patients.  Critical care was necessary to treat or prevent imminent or life-threatening deterioration.  Critical care was time spent personally by me on the following activities: development of treatment plan with patient and/or surrogate as well as nursing, discussions with consultants, evaluation of patient's response to treatment, examination of patient, obtaining history from patient or surrogate, ordering and performing treatments and interventions, ordering and review of laboratory studies, ordering and review of radiographic studies, pulse oximetry and re-evaluation of patient's condition.   The patients results and plan were reviewed and discussed.   Any x-rays performed were independently reviewed by myself.   Differential diagnosis were considered with the presenting HPI.  Medications  heparin bolus via infusion 2,000 Units (2,000 Units Intravenous Given  04/21/15 1013)    Followed by  heparin ADULT infusion 100 units/mL (25000 units/250 mL) (750 Units/hr Intravenous New Bag/Given 04/21/15 1015)    Filed Vitals:   04/21/15 1015 04/21/15 1030 04/21/15 1045 04/21/15 1052  BP: 142/46 113/48 119/53 130/55  Pulse:   54 67  Temp:    98.8 F (37.1 C)  TempSrc:    Oral  Resp: 16 18 16 18   Height:      Weight:      SpO2:   97% 97%    Final diagnoses:  Ischemic foot    Admission/ observation were discussed with the admitting physician, patient and/or family and they are comfortable with the plan.      Deno Etienne, DO 04/21/15 1054

## 2015-04-21 NOTE — Consult Note (Signed)
Patient name: Rachel [name] MRN: [id number] DOB: [birth year] Sex: female   Referred by: [facility] hospital[facility]  Reason for referral:  Chief Complaint  Patient presents with  . Foot Injury    HISTORY OF PRESENT ILLNESS: 82 year old female with multiple medical issues. She is cared for at home by her family. She has severe dementia. Also has prior strokes and is completely nonambulatory. She cannot stand to transfer and is completely lift from bed to chair. She was recently admitted to [facility] with cellulitis of her left leg. She was discharged. He was found over the past several days with her caregiver to have some duskiness of her left foot. She was brought to the emergency room today and was admitted for further evaluation and treatment. The patient is able to answer some simple questions. She denies any pain.   Past Medical History  Diagnosis Date  . Dementia   . Hypertension   . Stroke [facility])     Left sided weakness  . Multiple sclerosis ([place])   . Hyperlipidemia   . Hypothyroidism   . Acute on chronic kidney failure, stage II - III ([place]) 04/13/2015  . Cellulitis and abscess of leg 04/13/2015  . Thrombocytosis ([place]) 04/13/2015  . Pressure ulcer of right heel, stage 4 ([place]) 04/15/2015  . Severe protein-calorie malnutrition [facility])     Past Surgical History  Procedure Laterality Date  . Total hip arthroplasty Right   . Total hip arthroplasty Left   . Coronary artery bypass graft      Social History   Social History  . Marital Status: Married    Spouse Name: N/A  . Number of Children: 3  . Years of Education: N/A   Occupational History  . Not on file.   Social History Main Topics  . Smoking status: Never Smoker   . Smokeless tobacco: Never Used  . Alcohol Use: No  . Drug Use: No  . Sexual Activity: Not on file   Other Topics Concern  . Not on file   Social History Narrative   Married.  Lives with granddaughter & husband.  Wheelchair bound.     Family History  Problem Relation Age of Onset  . Obesity Other   . Heart attack Brother     Allergies as of 04/21/2015 - Review Complete 04/21/2015  Allergen Reaction Noted  . Aspirin  02/27/2015    No current facility-administered medications on file prior to encounter.   Current Outpatient Prescriptions on File Prior to Encounter  Medication Sig Dispense Refill  . bisacodyl (DULCOLAX) 5 MG EC tablet Take 1 tablet (5 mg total) by mouth daily as needed for moderate constipation. 30 tablet 0  . Cholecalciferol (VITAMIN D3) 5000 UNITS CAPS Take 5,000 Units by mouth daily.    . clopidogrel (PLAVIX) 75 MG tablet Take 1 tablet (75 mg total) by mouth daily. 30 tablet 0  . doxycycline (VIBRA-TABS) 100 MG tablet Take 1 tablet (100 mg total) by mouth every 12 (twelve) hours. 14 tablet 0  . HYDROcodone-acetaminophen (NORCO/VICODIN) 5-325 MG tablet Take 1-2 tablets by mouth every 4 (four) hours as needed for moderate pain. 30 tablet 0  . lisinopril (PRINIVIL,ZESTRIL) 20 MG tablet Take 20 mg by mouth daily.    . Multiple Vitamin (MULTIVITAMIN WITH MINERALS) TABS tablet Take 1 tablet by mouth daily.    . pravastatin (PRAVACHOL) 20 MG tablet Take 1 tablet (20 mg total) by mouth daily at 6 PM. 30 tablet 3  .  senna-docusate (SENOKOT-S) 8.6-50 MG tablet Take 1 tablet by mouth at bedtime as needed for mild constipation. 30 tablet 0  . Vitamins A & D (VITAMIN A & D) ointment Apply 1 application topically as needed (with every adult brief change.).    Marland Kitchen levothyroxine (SYNTHROID, LEVOTHROID) 25 MCG tablet Take 25 mcg by mouth daily before breakfast.       REVIEW OF SYSTEMS:  Negative except for past history PHYSICAL EXAMINATION:  General: The patient is a well-nourished female, in no acute distress. Vital signs are BP 101/31 mmHg  Pulse 77  Temp(Src) 98.6 F (37 C) (Oral)  Resp 17  Ht 5\' 4"  (1.626 m)  Wt 101 lb (45.813 kg)  BMI 17.33 kg/m2  SpO2 96% Pulmonary: There is a good air  exchange Abdomen: Soft and non-tender Musculoskeletal: His legs are rotated medially with the long history of nonambulatory [place] status. Neurologic: Minimal motor function in her feet bilaterally Skin: Left foot is dusky from the ankle distally. There is epidermal lysis on the medial foot and great toe Psychiatric: Responsive and able to answer simple questions. Cardiovascular: Palpable brachial pulses. I do not palpate radial pulses. No palpable femoral pulses.   Impression and Plan:  Very debilitated patient with severe dementia and inability to stand or walk for many years. Presents with acute on chronic left leg ischemia. Had long discussion with the patient and family present. The patient is very poor understanding. Fortunately she is not having any pain related to her critical ischemia. Explained the family that the only option would be above-knee amputation. No role for revascularization since she is non-ambulatory. Explain some risk of healing above-knee amputation due to no left femoral pulse. Explained that is does not need to be done urgently and progressive pain or progressive tissue loss would be what would be required. Would consider a palliative care consult to help the family with this difficult decision. We'll follow along and are available for left above-knee amputation if the family decides this is appropriate treatment    Lima Chillemi [facility] Office: [phone]

## 2015-04-21 NOTE — ED Notes (Signed)
EKG complete and shown to Dr. Tyrone Nine

## 2015-04-21 NOTE — H&P (Signed)
History and Physical:    Rachel [name]   [id number] DOB: 1928-03-07 DOA: 04/21/2015  Referring MD/provider: Dr. Tyrone Nine PCP: Rachell Cipro, MD   Chief Complaint: Left foot discolored  History of Present Illness:   Rachel [name] is an 82 y.o. female with a PMH of dementia (wheelchair bound), HTN, ischemic stroke, recent hospitalization 04/13/15-04/17/15 for treatment of cellulitis of the left leg treated with vancomycin and doxycycline, who lives with her daughter and who noticed that her left foot appeared darker than normal last night.  She discussed this with a family member who is an Therapist, sports who felt that the patient's circulation was compromised and recommended she massage it. The patient's daughter felt that the color improved with massage and placing a hot pack on the area.  This morning, she felt that the dark color was worse, and that the color was not returning with massage, and brought her to the ED for evaluation.  The patient's daughter reported pain with massage, but otherwise has not reported pain.  Patient is currently without any complaints, although she is fairly lethargic and has a history of dementia. Upon initial evaluation in the ED, the patient has a frankly dusky appearing left foot. The ED physician spoke with Dr. Donnetta Hutching of vascular surgery who requested that the patient be transferred to [facility] for urgent consultation.  ROS:   Review of Systems  Constitutional: Negative for fever, chills, weight loss and malaise/fatigue.  HENT: Negative.   Eyes: Negative.   Respiratory: Negative for cough and shortness of breath.   Cardiovascular: Positive for leg swelling. Negative for chest pain and palpitations.  Gastrointestinal: Negative for heartburn, nausea, vomiting, abdominal pain, diarrhea, constipation, blood in stool and melena.  Genitourinary: Negative.   Skin:       Dusky left foot  Neurological: Positive for weakness.     Past Medical History:   Past Medical  History  Diagnosis Date  . Dementia   . Hypertension   . Stroke [facility])     Left sided weakness  . Multiple sclerosis ([place])   . Hyperlipidemia   . Hypothyroidism   . Acute on chronic kidney failure, stage II - III ([place]) 04/13/2015  . Cellulitis and abscess of leg 04/13/2015  . Thrombocytosis ([place]) 04/13/2015  . Pressure ulcer of right heel, stage 4 ([place]) 04/15/2015  . Severe protein-calorie malnutrition [facility])     Past Surgical History:   Past Surgical History  Procedure Laterality Date  . Total hip arthroplasty Right   . Total hip arthroplasty Left   . Coronary artery bypass graft      Social History:   Social History   Social History  . Marital Status: Married    Spouse Name: N/A  . Number of Children: 3  . Years of Education: N/A   Occupational History  . Not on file.   Social History Main Topics  . Smoking status: Never Smoker   . Smokeless tobacco: Never Used  . Alcohol Use: No  . Drug Use: No  . Sexual Activity: Not on file   Other Topics Concern  . Not on file   Social History Narrative   Married.  Lives with granddaughter & husband.  Wheelchair bound.    Family history:   Family History  Problem Relation Age of Onset  . Obesity Other   . Heart attack Brother     Allergies   Aspirin  Current Medications:   Prior to Admission medications   Medication Sig Start Date End  Date Taking? Authorizing Provider  bisacodyl (DULCOLAX) 5 MG EC tablet Take 1 tablet (5 mg total) by mouth daily as needed for moderate constipation. 04/17/15  Yes Theodis Blaze, MD  Cholecalciferol (VITAMIN D3) 5000 UNITS CAPS Take 5,000 Units by mouth daily.   Yes Historical Provider, MD  clopidogrel (PLAVIX) 75 MG tablet Take 1 tablet (75 mg total) by mouth daily. 03/02/15  Yes Charlynne Cousins, MD  doxycycline (VIBRA-TABS) 100 MG tablet Take 1 tablet (100 mg total) by mouth every 12 (twelve) hours. 04/17/15  Yes Theodis Blaze, MD  HYDROcodone-acetaminophen (NORCO/VICODIN) 5-325  MG tablet Take 1-2 tablets by mouth every 4 (four) hours as needed for moderate pain. 04/17/15  Yes Theodis Blaze, MD  lisinopril (PRINIVIL,ZESTRIL) 20 MG tablet Take 20 mg by mouth daily.   Yes Historical Provider, MD  Multiple Vitamin (MULTIVITAMIN WITH MINERALS) TABS tablet Take 1 tablet by mouth daily.   Yes Historical Provider, MD  pravastatin (PRAVACHOL) 20 MG tablet Take 1 tablet (20 mg total) by mouth daily at 6 PM. 03/02/15  Yes Charlynne Cousins, MD  senna-docusate (SENOKOT-S) 8.6-50 MG tablet Take 1 tablet by mouth at bedtime as needed for mild constipation. 04/17/15  Yes Theodis Blaze, MD  Vitamins A & D (VITAMIN A & D) ointment Apply 1 application topically as needed (with every adult brief change.).   Yes Historical Provider, MD  levothyroxine (SYNTHROID, LEVOTHROID) 25 MCG tablet Take 25 mcg by mouth daily before breakfast.    Historical Provider, MD    Physical Exam:   Filed Vitals:   04/21/15 1015 04/21/15 1030 04/21/15 1045 04/21/15 1052  BP: 142/46 113/48 119/53 130/55  Pulse:   54 67  Temp:    98.8 F (37.1 C)  TempSrc:    Oral  Resp: 16 18 16 18   Height:      Weight:      SpO2:   97% 97%     Physical Exam: Blood pressure 130/55, pulse 67, temperature 98.8 F (37.1 C), temperature source Oral, resp. rate 18, height 5\' 4"  (1.626 m), weight 45.813 kg (101 lb), SpO2 97 %. Gen: No acute distress. Lethargic. Frail. Head: Normocephalic, atraumatic. Eyes: PERRL, EOMI, sclerae nonicteric. Mouth: Oropharynx clear with mildly dry mucous membranes. Neck: Supple, no thyromegaly, no lymphadenopathy, no jugular venous distention. Chest: Lungs clear to auscultation bilaterally, diminished in the bases. CV: Heart sounds are regular with occasional skipped beat. No murmurs, rubs, or gallops. Abdomen: Soft, nontender, nondistended with normal active bowel sounds. Extremities: LLE with erythema and dusky forefoot and toes.  Superficial ulcer, quarter sized with red wound base on  top of foot.  Hematoma lateral lower leg and about ankle. Skin: Warm and dry except as noted above. Neuro: Lethargic, unable to fully assess. Disoriented, minimally verbal when awakened.  Follow simple commands once fully awakened. Psych: Mood and affect normal.   Data Review:    Labs: Basic Metabolic Panel:  Recent Labs Lab 04/15/15 0546 04/16/15 0521 04/17/15 0545 04/21/15 1012  NA 144 140 138 139  K 4.2 4.8 4.9 6.7*  CL 111 107 104 106  CO2 24 24 25   --   GLUCOSE 125* 127* 121* 114*  BUN 49* 48* 54* 75*  CREATININE 1.58* 1.32* 1.25* 1.60*  CALCIUM 9.5 9.4 9.6  --    Liver Function Tests: No results for input(s): AST, ALT, ALKPHOS, BILITOT, PROT, ALBUMIN in the last 168 hours. No results for input(s): LIPASE, AMYLASE in the last 168 hours.  No results for input(s): AMMONIA in the last 168 hours. CBC:  Recent Labs Lab 04/15/15 0546 04/16/15 0521 04/17/15 0545 04/21/15 1006 04/21/15 1012  WBC 13.0* 13.7* 13.4* 16.1*  --   NEUTROABS  --   --   --  PENDING  --   HGB 11.9* 11.4* 11.2* 11.5* 13.6  HCT 36.5 34.7* 35.1* 35.4* 40.0  MCV 75.7* 75.3* 77.0* 76.1*  --   PLT 882* 976* 1107* 1158*  --    Cardiac Enzymes: No results for input(s): CKTOTAL, CKMB, CKMBINDEX, TROPONINI in the last 168 hours.  BNP (last 3 results) No results for input(s): PROBNP in the last 8760 hours. CBG:  Recent Labs Lab 04/15/15 0801 04/16/15 0756 04/17/15 0718  GLUCAP 103* 100* 113*    Radiographic Studies: No results found. *I have personally reviewed the images above*  EKG: Independently reviewed. Ectopic atrial rhythm with Q waves in V1 and V2. 73 bpm.   Assessment/Plan:   Principal Problem:   Ischemic foot - Transfer to [facility] for urgent vascular surgery consultation with Dr. Donnetta Hutching. - IV heparin per pharmacy. Continue Plavix for now. - No elevation of serum lactate at this time.    Hyperkalemia - Hyperkalemia order set utilized for treatment.  - Given one amp of  bicarbonate, albuterol, one amp of D50 followed by 10 units of insulin. - Repeat potassium.  Active Problems:   Severe protein calorie malnutrition - Evaluated by dietitian during prior admission. Currently nothing by mouth. - We'll start nutritional supplements when diet advanced.    History of multiple sclerosis - Wheelchair bound at baseline. PT/OT consultations.    Dementia - Lives with family.    Hypothyroidism - Continue Synthroid.    Acute on chronic kidney failure, stage II - III ([facility]) - Baseline creatinine 1.3. Hydrate and monitor. - Hold lisinopril.    Thrombocytosis ([place]) - Likely reactive given limb ischemia.    Pressure ulcer of right heel, stage 4 ([place]) - Wound care RN to evaluate.    DVT prophylaxis - On therapeutic dose heparin.  Code Status / Family Communication / Disposition Plan:   Code Status: DO NOT RESUSCITATE Family Communication: Granddaughter at the bedside. Disposition Plan: Home versus SNF when limb ischemia evaluated and addressed, likely several days in the hospital.  Attestation regarding necessity of inpatient status:   The appropriate admission status for this patient is INPATIENT. Inpatient status is judged to be reasonable and necessary in order to provide the required intensity of service to ensure the patient's safety. The patient's presenting symptoms, physical exam findings, and initial radiographic and laboratory data in the context of their chronic comorbidities is felt to place them at high risk for further clinical deterioration. Furthermore, it is not anticipated that the patient will be medically stable for discharge from the hospital within 2 midnights of admission. The following factors support the admission status of inpatient.   -The patient's presenting symptoms include left foot pain, lethargy. - The worrisome physical exam findings include dusky appearing left foot. - The initial radiographic and laboratory data are  worrisome because of leukocytosis, thrombocytosis, and hyperkalemia. - The chronic co-morbidities include multiple sclerosis, dementia, history of stroke, CAD. - Patient requires inpatient status due to high intensity of service, high risk for further deterioration and high frequency of surveillance required. - I certify that at the point of admission it is my clinical judgment that the patient will require inpatient hospital care spanning beyond 2 midnights from the point of admission.  Time spent: 75 minutes.  Reylene Stauder [facility] Pager [phone] Cell: [phone]   If 7PM-7AM, please contact night-coverage [email or web address] Password [facility] 04/21/2015, 11:04 AM

## 2015-04-21 NOTE — ED Notes (Signed)
Pt brought to ED by grand-daughter reporting that pts L foot "changed color yesterday." Grand-daughter sts that she massaged area and to get more circulation to area and the skin came of the top of the L foot. Pt foot is cold and has faint, weak pulses to L foot. Pt denies pain at this time. Pt is alert to her normal baseline and is in NAD

## 2015-04-22 DIAGNOSIS — Z515 Encounter for palliative care: Secondary | ICD-10-CM

## 2015-04-22 DIAGNOSIS — Z7189 Other specified counseling: Secondary | ICD-10-CM

## 2015-04-22 LAB — CBC
HEMATOCRIT: 30.9 % — AB (ref 36.0–46.0)
Hemoglobin: 10 g/dL — ABNORMAL LOW (ref 12.0–15.0)
MCH: 24.5 pg — AB (ref 26.0–34.0)
MCHC: 32.4 g/dL (ref 30.0–36.0)
MCV: 75.7 fL — AB (ref 78.0–100.0)
PLATELETS: 1128 10*3/uL — AB (ref 150–400)
RBC: 4.08 MIL/uL (ref 3.87–5.11)
RDW: 16.7 % — AB (ref 11.5–15.5)
WBC: 15.3 10*3/uL — ABNORMAL HIGH (ref 4.0–10.5)

## 2015-04-22 LAB — BASIC METABOLIC PANEL
Anion gap: 6 (ref 5–15)
BUN: 48 mg/dL — AB (ref 6–20)
CHLORIDE: 108 mmol/L (ref 101–111)
CO2: 26 mmol/L (ref 22–32)
CREATININE: 1.51 mg/dL — AB (ref 0.44–1.00)
Calcium: 8.6 mg/dL — ABNORMAL LOW (ref 8.9–10.3)
GFR calc Af Amer: 35 mL/min — ABNORMAL LOW (ref >60)
GFR calc non Af Amer: 30 mL/min — ABNORMAL LOW (ref >60)
GLUCOSE: 115 mg/dL — AB (ref 65–99)
Potassium: 4 mmol/L (ref 3.5–5.1)
Sodium: 140 mmol/L (ref 135–145)

## 2015-04-22 LAB — HEPARIN LEVEL (UNFRACTIONATED)
Heparin Unfractionated: <0.1 IU/mL — ABNORMAL LOW (ref 0.30–0.70)
Heparin Unfractionated: 0.26 IU/mL — ABNORMAL LOW (ref 0.30–0.70)
Heparin Unfractionated: 0.11 IU/mL — ABNORMAL LOW (ref 0.30–0.70)

## 2015-04-22 MED ORDER — HEPARIN BOLUS VIA INFUSION
1000.0000 [IU] | Freq: Once | INTRAVENOUS | Status: AC
  Administered 2015-04-22: 1000 [IU] via INTRAVENOUS
  Filled 2015-04-22: qty 1000

## 2015-04-22 MED ORDER — HEPARIN BOLUS VIA INFUSION
2000.0000 [IU] | Freq: Once | INTRAVENOUS | Status: AC
  Administered 2015-04-22: 2000 [IU] via INTRAVENOUS
  Filled 2015-04-22: qty 2000

## 2015-04-22 NOTE — Consult Note (Signed)
WOC wound consult note Reason for Consult: right heel ulcer.  Patient with ischemic left foot, some partial thickness skin loss on the left dorsal foot.  Patient is getting her bath with NT and RN at bedside. She will answer but has eyes closed when I assess her feet.  Wound type: right heel with DTPI (deep tissue pressure injury) Pressure Ulcer POA: Yes Measurement: right medial heel: 3cm x 3cm x 0cm; dark maroon tissue, slightly fluctuant    Left dorsal foot: 2cm x 2cm x 0.1cm; partial thickness unroofed blister   Wound bed: see above Drainage (amount, consistency, odor) none Periwound: ischemic changes to the left foot.  Right foot intact with palpable pulse, foot warm Dressing procedure/placement/frequency: Will add Prevalon boot for offloading the right heel; Foam dressing to protect site. Vaseline gauze to the left foot if patient/family decide not to proceed with amputation.   Discussed POC with bedside nurse.  Re consult if needed, will not follow at this time. Thanks  Analeise Mccleery [place], [place] [phone])

## 2015-04-22 NOTE — Progress Notes (Signed)
Patient ID: Rachel [name], female   DOB: [birth year], 82 y.o.   MRN: [id number] Sleeping comfortably. No family in the room. Does not arouse on examination. No change in left foot. Cold and dusky from above ankle down into foot with lysis of skin.    No evidence of sepsis related to ischemic foot. Only option would be above-knee amputation.

## 2015-04-22 NOTE — Progress Notes (Signed)
ANTICOAGULATION CONSULT NOTE - Follow Up Consult  Pharmacy Consult for Heparin Indication: ischemic foot  Allergies  Allergen Reactions  . Aspirin     Unknown reaction.     Patient Measurements: Height: 5\' 4"  (162.6 cm) Weight: 101 lb (45.813 kg) IBW/kg (Calculated) : 54.7  Vital Signs: Temp: 99.4 F (37.4 C) (01/29 2212) Temp Source: Oral (01/29 2212) BP: 129/66 mmHg (01/29 2212) Pulse Rate: 78 (01/29 2212)  Labs:  Recent Labs  04/21/15 1006 04/21/15 1012  04/21/15 1106  04/22/15 0430 04/22/15 1225 04/22/15 2200  HGB 11.5* 13.6  --   --   --  10.0*  --   --   HCT 35.4* 40.0  --   --   --  30.9*  --   --   PLT 1158*  --   --   --   --  1128*  --   --   APTT 29  --   --   --   --   --   --   --   LABPROT 15.6*  --   --   --   --   --   --   --   INR 1.28  --   --   --   --   --   --   --   HEPARINUNFRC  --   --   < >  --   < > <0.10* 0.26* 0.11*  CREATININE  --  1.60*  --  1.82*  --  1.51*  --   --   < > = values in this interval not displayed.  Estimated Creatinine Clearance: 19.7 mL/min (by C-G formula based on Cr of 1.51).   Assessment: 87yof continues on IV heparin for an ischemic left foot. Per vascular surgery only option would be AKA.  Heparin level dropped to 0.11 this evening  Goal of Therapy:  Heparin level 0.3-0.7 units/ml Monitor platelets by anticoagulation protocol: Yes   Plan:  Heparin 2000 units iv bolus x 1 Increase heparin to 1200 units / hr Follow up 8 hour heparin level  Thank you Anette Guarneri, PharmD [phone] 04/22/2015,11:13 PM

## 2015-04-22 NOTE — Progress Notes (Signed)
CRITICAL VALUE ALERT  Critical value received:  Platelets 1,128. No new orders received.  Date of notification:  04/22/15  Time of notification:  05:34  Critical value read back: yes  Nurse who received alert:  Javian Nudd  MD notified (1st page):  Yes  Time of first page:  05:38  MD notified (2nd page):  Time of second page:  Responding MD:  Rogue Bussing  Time MD responded:  05:39

## 2015-04-22 NOTE — Progress Notes (Signed)
Utilization review completed.  

## 2015-04-22 NOTE — Progress Notes (Signed)
[facility]                                                                              Patient Demographics  Rachel [name], is a 82 y.o. female, DOB - 1927/12/05, [id number]  Admit date - 04/21/2015   Admitting Physician Venetia Maxon Rama, MD  Outpatient Primary MD for the patient is [facility], MD  LOS - 1   Chief Complaint  Patient presents with  . Foot Injury      HPI on 04/21/2015 by Dr. Jacquelynn Cree Rachel [name] is an 82 y.o. female with a PMH of dementia (wheelchair bound), HTN, ischemic stroke, recent hospitalization 04/13/15-04/17/15 for treatment of cellulitis of the left leg treated with vancomycin and doxycycline, who lives with her daughter and who noticed that her left foot appeared darker than normal last night. She discussed this with a family member who is an Therapist, sports who felt that the patient's circulation was compromised and recommended she massage it. The patient's daughter felt that the color improved with massage and placing a hot pack on the area. This morning, she felt that the dark color was worse, and that the color was not returning with massage, and brought her to the ED for evaluation. The patient's daughter reported pain with massage, but otherwise has not reported pain. Patient is currently without any complaints, although she is fairly lethargic and has a history of dementia. Upon initial evaluation in the ED, the patient has a frankly dusky appearing left foot. The ED physician spoke with Dr. Donnetta Hutching of vascular surgery who requested that the patient be transferred to [facility] for urgent consultation.  Assessment & Plan   Left Ischemic limb -Patient was transferred to [facility] long hospital for vascular surgery consult -Basilar surgery consultation appreciated, recommended above-the-knee amputation. -Continue Plavix and IV heparin -Does not appear to be infectious at this time -Wound care consulted  Hyperkalemia -Resolved,  continue to monitor BMP  Thrombocytosis -Likely acute phase reactant due to limb ischemia -Continue to monitor CBC  Acute on chronic kidney failure, stage III -Creatinine improving, currently 1.51 -Lisinopril held -Likely secondary to poor oral intake -continue to monitor BMP  Pressure ulcer of the right heel, stage IV -Wound care consulted  Severe protein Malnutrition -Continue dysphagia 3 diet and nutritional supplements when awake  History of multiple sclerosis -Wheelchair bound at baseline -PT and OT consulted  Dementia -Stable?  Hypothyroidism -Continue Synthroid  Code Status: DO NOT RESUSCITATE  Family Communication: None at bedside  Disposition Plan: Admitted. Pending decision regarding amputation. Palliative care also consulted.  Time Spent in minutes   30 minutes  Procedures  None  Consults   Vascular surgery Palliative care  DVT Prophylaxis  heparin  Lab Results  Component Value Date   PLT 1128* 04/22/2015    Medications  Scheduled Meds: . cholecalciferol  5,000 Units Oral Daily  . clopidogrel  75 mg Oral Daily  . levothyroxine  25 mcg Oral QAC breakfast  . multivitamin with minerals  1 tablet Oral Daily  . pravastatin  20 mg Oral q1800  . sodium chloride flush  3 mL Intravenous Q12H   Continuous Infusions: . sodium chloride 100 mL/hr  at 04/22/15 0245  . heparin 950 Units/hr (04/22/15 0510)   PRN Meds:.acetaminophen **OR** acetaminophen, alum & mag hydroxide-simeth, bisacodyl, HYDROcodone-acetaminophen, morphine injection, ondansetron **OR** ondansetron (ZOFRAN) IV, oxyCODONE, senna-docusate  Antibiotics    Anti-infectives    None      Subjective:   Rachel [name] seen and examined today.   Patient very somnolent this morning. Does not awaken easily.  Objective:   Filed Vitals:   04/21/15 1115 04/21/15 1221 04/21/15 1943 04/22/15 0505  BP: 121/56 101/31 124/31 127/71  Pulse: 59 77 77 86  Temp:  98.6 F (37 C) 98.5 F (36.9  C) 98.4 F (36.9 C)  TempSrc:  Oral Oral Oral  Resp: 19 17 18 18   Height:      Weight:      SpO2: 97% 96% 95% 94%    Wt Readings from Last 3 Encounters:  04/21/15 45.813 kg (101 lb)  04/14/15 45.9 kg (101 lb 3.1 oz)  02/28/15 45.859 kg (101 lb 1.6 oz)    No intake or output data in the 24 hours ending 04/22/15 1138  Exam  General: Well developed, elderly, frail, NAD  HEENT: NCAT, mucous membranes mildly dry   Cardiovascular: S1 S2 auscultated, RRR, soft murmur  Respiratory: Clear to auscultation bilaterally with equal chest rise  Abdomen: Soft, nontender, nondistended, + bowel sounds  Extremities: warm dry without cyanosis clubbing RLE.   Neuro: Unable to assess, patient currently somnolent  Skin: LLE with erythema, duskiness, cold to touch, quarter size ulcer noted on foot. Hematoma also noted.  Data Review   Micro Results Recent Results (from the past 240 hour(s))  Culture, blood (routine x 2)     Status: None   Collection Time: 04/13/15 10:50 PM  Result Value Ref Range Status   Specimen Description BLOOD RIGHT ARM  Final   Special Requests BOTTLES DRAWN AEROBIC AND ANAEROBIC 5 CC  Final   Culture   Final    NO GROWTH 5 DAYS Performed at [facility]    Report Status 04/19/2015 FINAL  Final  Culture, blood (routine x 2)     Status: None   Collection Time: 04/13/15 10:55 PM  Result Value Ref Range Status   Specimen Description BLOOD LEFT ARM  Final   Special Requests 5 CC AEROBIC BOTTLE ONLY  Final   Culture   Final    NO GROWTH 5 DAYS Performed at [facility]    Report Status 04/19/2015 FINAL  Final    Radiology Reports Dg Tibia/fibula Left  04/13/2015  CLINICAL DATA:  Recent fall. Left leg pain and swelling. Initial encounter. EXAM: LEFT TIBIA AND FIBULA - 2 VIEW COMPARISON:  None. FINDINGS: There is no evidence of fracture or other focal bone lesions. Generalized osteopenia noted. 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 Extensive peripheral vascular calcification noted. Soft tissue swelling is seen along the lateral aspect of the fibular shaft. IMPRESSION: Lateral soft tissue swelling.  No evidence of fracture. Electronically Signed   By: Earle Gell M.D.   On: 04/13/2015 17:30   Dg Ankle Complete Left  04/13/2015  CLINICAL DATA:  Recent fall. Left ankle injury and pain. Initial encounter. EXAM: LEFT ANKLE COMPLETE - 3+ VIEW COMPARISON:  None. FINDINGS: There is no evidence of fracture, dislocation, or joint effusion. There is no evidence of arthropathy or other focal bone abnormality. Well corticated ossific densities are seen along the talar neck, likely due to old avulsion injuries or chronic trauma. Generalized osteopenia noted. Lateral soft tissue  swelling also demonstrated. IMPRESSION: Lateral soft tissue swelling. No evidence of acute fracture or dislocation. Electronically Signed   By: Earle Gell M.D.   On: 04/13/2015 17:32    CBC  Recent Labs Lab 04/16/15 0521 04/17/15 0545 04/21/15 1006 04/21/15 1012 04/22/15 0430  WBC 13.7* 13.4* 16.1*  --  15.3*  HGB 11.4* 11.2* 11.5* 13.6 10.0*  HCT 34.7* 35.1* 35.4* 40.0 30.9*  PLT 976* 1107* 1158*  --  1128*  MCV 75.3* 77.0* 76.1*  --  75.7*  MCH 24.7* 24.6* 24.7*  --  24.5*  MCHC 32.9 31.9 32.5  --  32.4  RDW 16.5* 16.8* 17.2*  --  16.7*  LYMPHSABS  --   --  1.4  --   --   MONOABS  --   --  1.1*  --   --   EOSABS  --   --  0.2  --   --   BASOSABS  --   --  0.0  --   --     Chemistries   Recent Labs Lab 04/16/15 0521 04/17/15 0545 04/21/15 1012 04/21/15 1106 04/22/15 0430  NA 140 138 139 143 140  K 4.8 4.9 6.7* 4.0 4.0  CL 107 104 106 106 108  CO2 24 25  --  26 26  GLUCOSE 127* 121* 114* 117* 115*  BUN 48* 54* 75* 54* 48*  CREATININE 1.32* 1.25* 1.60* 1.82* 1.51*  CALCIUM 9.4 9.6  --  9.4 8.6*  AST  --   --   --  68*  --   ALT  --   --   --  35  --   ALKPHOS  --   --   --  72  --   BILITOT  --   --   --  0.5  --     ------------------------------------------------------------------------------------------------------------------ estimated creatinine clearance is 19.7 mL/min (by C-G formula based on Cr of 1.51). ------------------------------------------------------------------------------------------------------------------ No results for input(s): HGBA1C in the last 72 hours. ------------------------------------------------------------------------------------------------------------------ No results for input(s): CHOL, HDL, LDLCALC, TRIG, CHOLHDL, LDLDIRECT in the last 72 hours. ------------------------------------------------------------------------------------------------------------------  Recent Labs  04/21/15 1106  TSH 13.958*   ------------------------------------------------------------------------------------------------------------------ No results for input(s): VITAMINB12, FOLATE, FERRITIN, TIBC, IRON, RETICCTPCT in the last 72 hours.  Coagulation profile  Recent Labs Lab 04/21/15 1006  INR 1.28    No results for input(s): DDIMER in the last 72 hours.  Cardiac Enzymes No results for input(s): CKMB, TROPONINI, MYOGLOBIN in the last 168 hours.  Invalid input(s): CK ------------------------------------------------------------------------------------------------------------------ Invalid input(s): POCBNP    Mansur Patti D.O. on 04/22/2015 at 11:38 AM  Between 7am to 7pm - Pager - [phone]  After 7pm go to [email or web address] - password [email or web address]  And look for the night coverage person covering for me after hours  [facility] Office  [phone]

## 2015-04-22 NOTE — Progress Notes (Signed)
ANTICOAGULATION CONSULT NOTE - Follow Up Consult  Pharmacy Consult for heparin Indication: ischemic foot  Labs:  Recent Labs  04/21/15 1006 04/21/15 1012 04/21/15 1105 04/21/15 1106 04/21/15 1848 04/22/15 0430  HGB 11.5* 13.6  --   --   --  10.0*  HCT 35.4* 40.0  --   --   --  30.9*  PLT 1158*  --   --   --   --  PENDING  APTT 29  --   --   --   --   --   LABPROT 15.6*  --   --   --   --   --   INR 1.28  --   --   --   --   --   HEPARINUNFRC  --   --  0.27*  --  <0.10* <0.10*  CREATININE  --  1.60*  --  1.82*  --   --     Assessment: 82yo female remains undetectable on heparin though apparently order had never been changed so still running at prior rate.  Goal of Therapy:  Heparin level 0.3-0.7 units/ml   Plan:  Will rebolus with heparin 1000 units and increase gtt by 4 units/kg/hr to 950 units/hr and check level in [place], PharmD, BCPS  04/22/2015,5:02 AM

## 2015-04-22 NOTE — Progress Notes (Signed)
ANTICOAGULATION CONSULT NOTE - Follow Up Consult  Pharmacy Consult for Heparin Indication: ischemic foot  Allergies  Allergen Reactions  . Aspirin     Unknown reaction.     Patient Measurements: Height: 5\' 4"  (162.6 cm) Weight: 101 lb (45.813 kg) IBW/kg (Calculated) : 54.7  Vital Signs: Temp: 98.4 F (36.9 C) (01/29 0505) Temp Source: Oral (01/29 0505) BP: 127/71 mmHg (01/29 0505) Pulse Rate: 86 (01/29 0505)  Labs:  Recent Labs  04/21/15 1006 04/21/15 1012  04/21/15 1106 04/21/15 1848 04/22/15 0430 04/22/15 1225  HGB 11.5* 13.6  --   --   --  10.0*  --   HCT 35.4* 40.0  --   --   --  30.9*  --   PLT 1158*  --   --   --   --  1128*  --   APTT 29  --   --   --   --   --   --   LABPROT 15.6*  --   --   --   --   --   --   INR 1.28  --   --   --   --   --   --   HEPARINUNFRC  --   --   < >  --  <0.10* <0.10* 0.26*  CREATININE  --  1.60*  --  1.82*  --  1.51*  --   < > = values in this interval not displayed.  Estimated Creatinine Clearance: 19.7 mL/min (by C-G formula based on Cr of 1.51).   Medications:  Heparin @ 950 units/hr  Assessment: 87yof continues on IV heparin for an ischemic left foot. Per vascular surgery only option would be AKA. Heparin level undetectable x 2 but now approaching goal at 0.26 after rate increase this morning. CBC overall stable - thrombocytosis noted which is likely acute phase reactant due to limb ischemia per MD note. No bleeding issues.  Goal of Therapy:  Heparin level 0.3-0.7 units/ml Monitor platelets by anticoagulation protocol: Yes   Plan:  1) Increase heparin to 1100 units/hr 2) Check 8 hour heparin level  Deboraha Sprang 04/22/2015,2:37 PM

## 2015-04-22 NOTE — Consult Note (Signed)
Consultation Note Date: 04/22/2015   Patient Name: Rachel [name]  DOB: 1928-03-19  MRN: [id number]  Age / Sex: 82 y.o., female  PCP: Fanny Bien, MD Referring Physician: Cristal Ford, DO  Reason for Consultation: Establishing goals of care    Clinical Assessment/Narrative:  Rachel [name] is an 82 y.o. female with a PMH of dementia (wheelchair bound), HTN, ischemic stroke, recent hospitalization 04/13/15-04/17/15 for treatment of cellulitis of the left leg treated with vancomycin and doxycycline, who lives with her daughter has been admitted with L LE ischemic limb. She has been seen by vascular surgery with recommendations for Above the knee amputation.   Palliative consult placed for further discussions and for goals of care discussions.   The patient is an elderly weak appearing lady resting in bed. She opens her eyes she is able to state her name. She answers very few yes/no type questions appropriately. Otherwise she repeats what is being asked of her.   Call placed and discussed with granddaughter Darrelyn Hillock, healthcare power of attorney, primary caregiver with whom the patient lives. Her number is [phone]. She is thankful for the information she has received from Dr. early. She is leaning towards the patient receiving above-the-knee amputation. She has wished for some time so that she can discuss with her brother. The granddaughter Darrelyn Hillock describes about multiple losses the family has had including the patient's daughters death in Jun 01, 2014.  Scope of palliative medicine services introduced. We will continue to follow along. All questions answered to the best of my ability. Palliative will continue to follow along. Thank you for the consult.  Contacts/Participants in Discussion: Primary Decision Maker:    Relationship to Patient   HCPOA: yes     SUMMARY OF RECOMMENDATIONS Awaiting  follow-up regarding decision for surgery-above-the-knee amputation from daughter healthcare power of attorney Darrelyn Hillock.  Palliative to follow up  Continue current mode of care. Code Status/Advance Care Planning: DNR    Code Status Orders        Start     Ordered   04/21/15 1228  Do not attempt resuscitation (DNR)   Continuous    Question Answer Comment  In the event of cardiac or respiratory ARREST Do not call a "code blue"   In the event of cardiac or respiratory ARREST Do not perform Intubation, CPR, defibrillation or ACLS   In the event of cardiac or respiratory ARREST Use medication by any route, position, wound care, and other measures to relive pain and suffering. May use oxygen, suction and manual treatment of airway obstruction as needed for comfort.      04/21/15 1227    Code Status History    Date Active Date Inactive Code Status Order ID Comments User Context   04/13/2015 10:03 PM 04/17/2015  6:50 PM DNR [id number]  Vianne Bulls, MD ED   02/28/2015  1:49 AM 03/02/2015  6:47 PM DNR [id number]  Norval Morton, MD Inpatient   02/28/2015 12:59 AM 02/28/2015  1:49 AM Full Code [id number]  Norval Morton, MD Inpatient    Advance Directive Documentation        Most Recent Value   Type of Advance Directive  Out of facility DNR (pink MOST or yellow form)   Pre-existing out of facility DNR order (yellow form or pink MOST form)     "MOST" Form in Place?        Other Directives:Other  Symptom Management:    as above  Palliative Prophylaxis:   Delirium  Protocol   Psycho-social/Spiritual:  Support System: Casey Desire for further Chaplaincy support:no Additional Recommendations: Caregiving  Support/Resources  Prognosis: Unable to determine  Discharge Planning:  Pending surgery   Chief Complaint/ Primary Diagnoses: Present on Admission:  . Ischemic foot . (Resolved) Ischemia of foot . Acute on chronic kidney failure, stage II - III ([place]) . Dementia .  Hypothyroidism . Pressure ulcer of right heel, stage 4 ([place]) . Thrombocytosis ([place]) . Severe protein-calorie malnutrition ([place]) . Hyperkalemia  I have reviewed the medical record, interviewed the patient and family, and examined the patient. The following aspects are pertinent.  Past Medical History  Diagnosis Date  . Dementia   . Hypertension   . Stroke [facility])     Left sided weakness  . Multiple sclerosis ([place])   . Hyperlipidemia   . Hypothyroidism   . Acute on chronic kidney failure, stage II - III ([place]) 04/13/2015  . Cellulitis and abscess of leg 04/13/2015  . Thrombocytosis ([place]) 04/13/2015  . Pressure ulcer of right heel, stage 4 ([place]) 04/15/2015  . Severe protein-calorie malnutrition ([place])    Social History   Social History  . Marital Status: Married    Spouse Name: N/A  . Number of Children: 3  . Years of Education: N/A   Social History Main Topics  . Smoking status: Never Smoker   . Smokeless tobacco: Never Used  . Alcohol Use: No  . Drug Use: No  . Sexual Activity: Not Asked   Other Topics Concern  . None   Social History Narrative   Married.  Lives with granddaughter & husband.  Wheelchair bound.   Family History  Problem Relation Age of Onset  . Obesity Other   . Heart attack Brother    Scheduled Meds: . cholecalciferol  5,000 Units Oral Daily  . clopidogrel  75 mg Oral Daily  . levothyroxine  25 mcg Oral QAC breakfast  . multivitamin with minerals  1 tablet Oral Daily  . pravastatin  20 mg Oral q1800  . sodium chloride flush  3 mL Intravenous Q12H   Continuous Infusions: . sodium chloride 100 mL/hr at 04/22/15 0245  . heparin 1,100 Units/hr (04/22/15 1436)   PRN Meds:.acetaminophen **OR** acetaminophen, alum & mag hydroxide-simeth, bisacodyl, HYDROcodone-acetaminophen, morphine injection, ondansetron **OR** ondansetron (ZOFRAN) IV, oxyCODONE, senna-docusate Medications Prior to Admission:  Prior to Admission medications   Medication Sig Start  Date End Date Taking? Authorizing Provider  bisacodyl (DULCOLAX) 5 MG EC tablet Take 1 tablet (5 mg total) by mouth daily as needed for moderate constipation. 04/17/15  Yes Theodis Blaze, MD  Cholecalciferol (VITAMIN D3) 5000 UNITS CAPS Take 5,000 Units by mouth daily.   Yes Historical Provider, MD  clopidogrel (PLAVIX) 75 MG tablet Take 1 tablet (75 mg total) by mouth daily. 03/02/15  Yes Charlynne Cousins, MD  doxycycline (VIBRA-TABS) 100 MG tablet Take 1 tablet (100 mg total) by mouth every 12 (twelve) hours. 04/17/15  Yes Theodis Blaze, MD  HYDROcodone-acetaminophen (NORCO/VICODIN) 5-325 MG tablet Take 1-2 tablets by mouth every 4 (four) hours as needed for moderate pain. 04/17/15  Yes Theodis Blaze, MD  lisinopril (PRINIVIL,ZESTRIL) 20 MG tablet Take 20 mg by mouth daily.   Yes Historical Provider, MD  Multiple Vitamin (MULTIVITAMIN WITH MINERALS) TABS tablet Take 1 tablet by mouth daily.   Yes Historical Provider, MD  pravastatin (PRAVACHOL) 20 MG tablet Take 1 tablet (20 mg total) by mouth daily at 6 PM. 03/02/15  Yes Charlynne Cousins, MD  senna-docusate (SENOKOT-S) 8.6-50 MG tablet Take 1 tablet by mouth at bedtime as needed for mild constipation. 04/17/15  Yes Theodis Blaze, MD  Vitamins A & D (VITAMIN A & D) ointment Apply 1 application topically as needed (with every adult brief change.).   Yes Historical Provider, MD  levothyroxine (SYNTHROID, LEVOTHROID) 25 MCG tablet Take 25 mcg by mouth daily before breakfast.    Historical Provider, MD   Allergies  Allergen Reactions  . Aspirin     Unknown reaction.     Review of Systems Complains of pain in legs  Physical Exam Frail elderly lady resting in bed Contractures and wounds bilateral LE L foot cool to touch S1 S2 Clear to auscultation anteriorly Abdomen soft  Vital Signs: BP 114/55 mmHg  Pulse 91  Temp(Src) 99.3 F (37.4 C) (Oral)  Resp 18  Ht 5\' 4"  (1.626 m)  Wt 45.813 kg (101 lb)  BMI 17.33 kg/m2  SpO2 96%  SpO2:  SpO2: 96 % O2 Device:SpO2: 96 % O2 Flow Rate: .   IO: Intake/output summary:  Intake/Output Summary (Last 24 hours) at 04/22/15 1559 Last data filed at 04/22/15 1500  Gross per 24 hour  Intake    360 ml  Output      0 ml  Net    360 ml    LBM: Last BM Date: 04/22/15 Baseline Weight: Weight: 45.813 kg (101 lb) Most recent weight: Weight: 45.813 kg (101 lb)      Palliative Assessment/Data:  Flowsheet Rows        Most Recent Value   Intake Tab    Referral Department  Hospitalist   Unit at Time of Referral  Med/Surg Unit   Palliative Care Primary Diagnosis  Other (Comment)   Palliative Care Type  New Palliative care   Reason for referral  Clarify Goals of Care   Date first seen by Palliative Care  04/22/15   Clinical Assessment    Palliative Performance Scale Score  30%   Pain Max last 24 hours  6   Pain Min Last 24 hours  5   Dyspnea Max Last 24 Hours  4   Dyspnea Min Last 24 hours  3   Psychosocial & Spiritual Assessment    Palliative Care Outcomes    Patient/Family meeting held?  No   Palliative Care follow-up planned  Yes, Facility      Additional Data Reviewed:  CBC:    Component Value Date/Time   WBC 15.3* 04/22/2015 0430   HGB 10.0* 04/22/2015 0430   HCT 30.9* 04/22/2015 0430   PLT 1128* 04/22/2015 0430   MCV 75.7* 04/22/2015 0430   NEUTROABS 13.4* 04/21/2015 1006   LYMPHSABS 1.4 04/21/2015 1006   MONOABS 1.1* 04/21/2015 1006   EOSABS 0.2 04/21/2015 1006   BASOSABS 0.0 04/21/2015 1006   Comprehensive Metabolic Panel:    Component Value Date/Time   NA 140 04/22/2015 0430   K 4.0 04/22/2015 0430   CL 108 04/22/2015 0430   CO2 26 04/22/2015 0430   BUN 48* 04/22/2015 0430   CREATININE 1.51* 04/22/2015 0430   GLUCOSE 115* 04/22/2015 0430   CALCIUM 8.6* 04/22/2015 0430   AST 68* 04/21/2015 1106   ALT 35 04/21/2015 1106   ALKPHOS 72 04/21/2015 1106   BILITOT 0.5 04/21/2015 1106   PROT 6.7 04/21/2015 1106   ALBUMIN 2.8* 04/21/2015 1106     Time  In:  1500 Time Out:  1600 Time Total:  60 Greater than 50%  of this  time was spent counseling and coordinating care related to the above assessment and plan.  Signed by: Loistine Chance, MD [id number] Loistine Chance, MD  04/22/2015, 3:59 PM  Please contact Palliative Medicine Team phone at [phone] for questions and concerns.

## 2015-04-23 LAB — CBC
HCT: 29.9 % — ABNORMAL LOW (ref 36.0–46.0)
Hemoglobin: 9.5 g/dL — ABNORMAL LOW (ref 12.0–15.0)
MCH: 24.2 pg — AB (ref 26.0–34.0)
MCHC: 31.8 g/dL (ref 30.0–36.0)
MCV: 76.1 fL — AB (ref 78.0–100.0)
PLATELETS: 1060 10*3/uL — AB (ref 150–400)
RBC: 3.93 MIL/uL (ref 3.87–5.11)
RDW: 16.8 % — AB (ref 11.5–15.5)
WBC: 13.2 10*3/uL — ABNORMAL HIGH (ref 4.0–10.5)

## 2015-04-23 LAB — PATHOLOGIST SMEAR REVIEW

## 2015-04-23 LAB — I-STAT CHEM 8, ED
BUN: 75 mg/dL — ABNORMAL HIGH (ref 6–20)
CREATININE: 1.6 mg/dL — AB (ref 0.44–1.00)
Calcium, Ion: 1.11 mmol/L — ABNORMAL LOW (ref 1.13–1.30)
Chloride: 106 mmol/L (ref 101–111)
Glucose, Bld: 114 mg/dL — ABNORMAL HIGH (ref 65–99)
HEMATOCRIT: 40 % (ref 36.0–46.0)
HEMOGLOBIN: 13.6 g/dL (ref 12.0–15.0)
POTASSIUM: 6.7 mmol/L — AB (ref 3.5–5.1)
SODIUM: 139 mmol/L (ref 135–145)
TCO2: 28 mmol/L (ref 0–100)

## 2015-04-23 LAB — BASIC METABOLIC PANEL
Anion gap: 11 (ref 5–15)
BUN: 35 mg/dL — AB (ref 6–20)
CALCIUM: 8.8 mg/dL — AB (ref 8.9–10.3)
CO2: 24 mmol/L (ref 22–32)
Chloride: 107 mmol/L (ref 101–111)
Creatinine, Ser: 1.28 mg/dL — ABNORMAL HIGH (ref 0.44–1.00)
GFR calc Af Amer: 43 mL/min — ABNORMAL LOW (ref >60)
GFR, EST NON AFRICAN AMERICAN: 37 mL/min — AB (ref >60)
GLUCOSE: 101 mg/dL — AB (ref 65–99)
POTASSIUM: 4 mmol/L (ref 3.5–5.1)
Sodium: 142 mmol/L (ref 135–145)

## 2015-04-23 LAB — HEPARIN LEVEL (UNFRACTIONATED): Heparin Unfractionated: 0.36 IU/mL (ref 0.30–0.70)

## 2015-04-23 NOTE — Care Management Note (Signed)
Case Management Note  Patient Details  Name: Rachel [name] MRN: [id number] Date of Birth: 06/07/1927  Subjective/Objective:                  LLE swelling  Action/Plan:   Pt has dementia and is taken care of at home by granddaughterJoelene [name] [phone] or [phone].   Discharge planning Expected Discharge Date:  04/17/15               Expected Discharge Plan:     In-House Referral:     Discharge planning Services     Post Acute Care Choice:    Choice offered to:     DME Arranged:    DME Agency:     HH Arranged:    [place] Agency:     Status of Service:     Medicare Important Message Given:    Date Medicare IM Given:    Medicare IM give by:    Date Additional Medicare IM Given:    Additional Medicare Important Message give by:     If discussed at [place] of Stay Meetings, dates discussed:    Additional Comments: 04/23/2015 Pt being evaluated by vascular surgery for possible amputation.  CM will continue to follow for disposition needs  04/17/15 CM received notice from RN of pt discharge.  Pt has dementia and is taken care of at home by granddaughter, Rachel [name] [phone] or [phone].  CM spoke with Rachel [name] who states she and her husband will come and pick up pt this afternoon.  Rachel [name] chooses [facility] to render HHPT/OT/RN/aide services.  Referral called to [facility] rep, Santiago Glad.  Rachel [name] states no DME needs.  No other CM needs were communicated. Maryclare Labrador, RN 04/23/2015, 2:12 PM

## 2015-04-23 NOTE — Progress Notes (Addendum)
     Pending family decision for left AKA.  Left foot ischemia.  Patient is on Plavix and heparin currently.  COLLINS, EMMA MAUREEN PA-C  I have examined the patient, reviewed and agree with above. No change in left foot exam. Patient is minimally responsive this afternoon. Will discuss with family regarding supportive care only versus left above-knee amputation  Curt Jews, MD 04/23/2015 3:29 PM

## 2015-04-23 NOTE — Progress Notes (Signed)
[facility]                                                                              Patient Demographics  Rachel [name], is a 82 y.o. female, DOB - [birth year], [id number]:[id number]  Admit date - 04/21/2015   Admitting Physician Rachel Maxon Rama, MD  Outpatient Primary MD for the patient is Rachel Loris, MD  LOS - 2   Chief Complaint  Patient presents with  . Foot Injury      HPI on 04/21/2015 by Rachel [name] Amilyn [name] is an 82 y.o. female with a PMH of dementia (wheelchair bound), HTN, ischemic stroke, recent hospitalization 04/13/15-04/17/15 for treatment of cellulitis of the left leg treated with vancomycin and doxycycline, who lives with her daughter and who noticed that her left foot appeared darker than normal last night. She discussed this with a family member who is an Therapist, sports who felt that the patient's circulation was compromised and recommended she massage it. The patient's daughter felt that the color improved with massage and placing a hot pack on the area. This morning, she felt that the dark color was worse, and that the color was not returning with massage, and brought her to the ED for evaluation. The patient's daughter reported pain with massage, but otherwise has not reported pain. Patient is currently without any complaints, although she is fairly lethargic and has a history of dementia. Upon initial evaluation in the ED, the patient has a frankly dusky appearing left foot. The ED physician spoke with Rachel [name] of vascular [name] who requested that the patient be transferred to Rachel [name] for urgent consultation.  Assessment & Plan   Left Ischemic limb -Patient was transferred to Rachel [name] from [facility] for vascular [name] consult -Rachel [name] consultation appreciated, recommended above-the-knee amputation. -Continue Rachel [name] and IV heparin -Does not appear to be infectious at this time -Wound care consulted  Hyperkalemia -Resolved,  continue to monitor BMP  Thrombocytosis -Likely acute phase reactant due to limb ischemia -Continue to monitor CBC  Acute on chronic kidney failure, stage III -Creatinine improving, currently 1.28 -Lisinopril held -Likely secondary to poor oral intake -continue to monitor BMP  Pressure ulcer of the right heel, stage IV -Wound care consulted  Severe protein Malnutrition -Continue dysphagia 3 diet and nutritional supplements when awake  History of multiple sclerosis -Wheelchair bound at baseline -PT and OT consulted  Dementia -Stable?  Hypothyroidism -Continue Synthroid  Code Status: DO NOT RESUSCITATE  Family Communication: None at bedside  Disposition Plan: Admitted. Pending decision regarding amputation. Palliative care also consulted.  Time Spent in minutes   30 minutes  Procedures  None  Consults   [name] Palliative care  DVT Prophylaxis  heparin  Lab Results  Component Value Date   PLT 1060* 04/23/2015    Medications  Scheduled Meds: . cholecalciferol  5,000 Units Oral Daily  . clopidogrel  75 mg Oral Daily  . levothyroxine  25 mcg Oral QAC breakfast  . multivitamin with minerals  1 tablet Oral Daily  . pravastatin  20 mg Oral q1800  . sodium chloride flush  3 mL Intravenous Q12H   Continuous Infusions: . sodium chloride 100 mL/hr  at 04/23/15 0235  . heparin 1,250 Units/hr (04/23/15 0231)   PRN Meds:.acetaminophen **OR** acetaminophen, alum & mag hydroxide-simeth, bisacodyl, HYDROcodone-acetaminophen, morphine injection, ondansetron **OR** ondansetron (ZOFRAN) IV, oxyCODONE, senna-docusate  Antibiotics    Anti-infectives    None      Subjective:   Rachel [name] seen and examined today.   Patient denies pain at this time, denies chest pain, shortness of breath, abdominal pain.  Does not know why she is here.   Objective:   Filed Vitals:   04/22/15 0505 04/22/15 1526 04/22/15 2212 04/23/15 0604  BP: 127/71 114/55 129/66  149/66  Pulse: 86 91 78 80  Temp: 98.4 F (36.9 C) 99.3 F (37.4 C) 99.4 F (37.4 C) 99.1 F (37.3 C)  TempSrc: Oral Oral Oral Oral  Resp: 18 18 18 18   Height:      Weight:      SpO2: 94% 96% 95% 99%    Wt Readings from Last 3 Encounters:  04/21/15 45.813 kg (101 lb)  04/14/15 45.9 kg (101 lb 3.1 oz)  02/28/15 45.859 kg (101 lb 1.6 oz)     Intake/Output Summary (Last 24 hours) at 04/23/15 1004 Last data filed at 04/23/15 0900  Gross per 24 hour  Intake    600 ml  Output      0 ml  Net    600 ml    Exam  General: Well developed, elderly, frail, NAD  HEENT: NCAT, mucous membranes mildly moist  Cardiovascular: S1 S2 auscultated, RRR, soft murmur  Respiratory: Clear to auscultation  Abdomen: Soft, nontender, nondistended, + bowel sounds  Extremities: warm dry without cyanosis clubbing RLE.   Neuro: Awake and alert to self, nonfocal  Skin: LLE with erythema, duskiness, cold to touch, quarter size ulcer noted on foot. Hematoma also noted.  Data Review   Micro Results Recent Results (from the past 240 hour(s))  Culture, blood (routine x 2)     Status: None   Collection Time: 04/13/15 10:50 PM  Result Value Ref Range Status   Specimen Description BLOOD RIGHT ARM  Final   Special Requests BOTTLES DRAWN AEROBIC AND ANAEROBIC 5 CC  Final   Culture   Final    NO GROWTH 5 DAYS Performed at [facility]    Report Status 04/19/2015 FINAL  Final  Culture, blood (routine x 2)     Status: None   Collection Time: 04/13/15 10:55 PM  Result Value Ref Range Status   Specimen Description BLOOD LEFT ARM  Final   Special Requests 5 CC AEROBIC BOTTLE ONLY  Final   Culture   Final    NO GROWTH 5 DAYS Performed at [facility]    Report Status 04/19/2015 FINAL  Final    Radiology Reports Dg Tibia/fibula Left  04/13/2015  CLINICAL DATA:  Recent fall. Left leg pain and swelling. Initial encounter. EXAM: LEFT TIBIA AND FIBULA - 2 VIEW COMPARISON:  None.  FINDINGS: There is no evidence of fracture or other focal bone lesions. Generalized osteopenia noted. Osteoarthritis seen involving lateral compartment knee. Extensive peripheral vascular calcification noted. Soft tissue swelling is seen along the lateral aspect of the fibular shaft. IMPRESSION: Lateral soft tissue swelling.  No evidence of fracture. Electronically Signed   By: Earle Gell M.D.   On: 04/13/2015 17:30   Dg Ankle Complete Left  04/13/2015  CLINICAL DATA:  Recent fall. Left ankle injury and pain. Initial encounter. EXAM: LEFT ANKLE COMPLETE - 3+ VIEW COMPARISON:  None. FINDINGS: There is  no evidence of fracture, dislocation, or joint effusion. There is no evidence of arthropathy or other focal bone abnormality. Well corticated ossific densities are seen along the talar neck, likely due to old avulsion injuries or chronic trauma. Generalized osteopenia noted. Lateral soft tissue swelling also demonstrated. IMPRESSION: Lateral soft tissue swelling. No evidence of acute fracture or dislocation. Electronically Signed   By: Earle Gell M.D.   On: 04/13/2015 17:32    CBC  Recent Labs Lab 04/17/15 0545 04/21/15 1006 04/21/15 1012 04/22/15 0430 04/23/15 0420  WBC 13.4* 16.1*  --  15.3* 13.2*  HGB 11.2* 11.5* 13.6 10.0* 9.5*  HCT 35.1* 35.4* 40.0 30.9* 29.9*  PLT 1107* 1158*  --  1128* 1060*  MCV 77.0* 76.1*  --  75.7* 76.1*  MCH 24.6* 24.7*  --  24.5* 24.2*  MCHC 31.9 32.5  --  32.4 31.8  RDW 16.8* 17.2*  --  16.7* 16.8*  LYMPHSABS  --  1.4  --   --   --   MONOABS  --  1.1*  --   --   --   EOSABS  --  0.2  --   --   --   BASOSABS  --  0.0  --   --   --     Chemistries   Recent Labs Lab 04/17/15 0545 04/21/15 1012 04/21/15 1106 04/22/15 0430 04/23/15 0420  NA 138 139 143 140 142  K 4.9 6.7* 4.0 4.0 4.0  CL 104 106 106 108 107  CO2 25  --  26 26 24   GLUCOSE 121* 114* 117* 115* 101*  BUN 54* 75* 54* 48* 35*  CREATININE 1.25* 1.60* 1.82* 1.51* 1.28*  CALCIUM 9.6  --   9.4 8.6* 8.8*  AST  --   --  68*  --   --   ALT  --   --  35  --   --   ALKPHOS  --   --  72  --   --   BILITOT  --   --  0.5  --   --    ------------------------------------------------------------------------------------------------------------------ estimated creatinine clearance is 23.2 mL/min (by C-G formula based on Cr of 1.28). ------------------------------------------------------------------------------------------------------------------ No results for input(s): HGBA1C in the last 72 hours. ------------------------------------------------------------------------------------------------------------------ No results for input(s): CHOL, HDL, LDLCALC, TRIG, CHOLHDL, LDLDIRECT in the last 72 hours. ------------------------------------------------------------------------------------------------------------------  Recent Labs  04/21/15 1106  TSH 13.958*   ------------------------------------------------------------------------------------------------------------------ No results for input(s): VITAMINB12, FOLATE, FERRITIN, TIBC, IRON, RETICCTPCT in the last 72 hours.  Coagulation profile  Recent Labs Lab 04/21/15 1006  INR 1.28    No results for input(s): DDIMER in the last 72 hours.  Cardiac Enzymes No results for input(s): CKMB, TROPONINI, MYOGLOBIN in the last 168 hours.  Invalid input(s): CK ------------------------------------------------------------------------------------------------------------------ Invalid input(s): POCBNP    Jodine Muchmore D.O. on 04/23/2015 at 10:04 AM  Between 7am to 7pm - Pager - [phone]  After 7pm go to [email or web address] - password [email or web address]  And look for the night coverage person covering for me after hours  [facility] Office  [phone]

## 2015-04-23 NOTE — Progress Notes (Signed)
ANTICOAGULATION CONSULT NOTE - Follow Up Consult  Pharmacy Consult for Heparin Indication: ischemic foot  Allergies  Allergen Reactions  . Aspirin     Unknown reaction.     Patient Measurements: Height: 5\' 4"  (162.6 cm) Weight: 101 lb (45.813 kg) IBW/kg (Calculated) : 54.7 Heparin Dosing Weight: 45.8 kg  Vital Signs: Temp: 99.1 F (37.3 C) (01/30 0604) Temp Source: Oral (01/30 0604) BP: 149/66 mmHg (01/30 0604) Pulse Rate: 80 (01/30 0604)  Labs:  Recent Labs  04/21/15 1006  04/21/15 1012  04/21/15 1106  04/22/15 0430 04/22/15 1225 04/22/15 2200 04/23/15 0420 04/23/15 0845  HGB 11.5*  --  13.6  --   --   --  10.0*  --   --  9.5*  --   HCT 35.4*  --  40.0  --   --   --  30.9*  --   --  29.9*  --   PLT 1158*  --   --   --   --   --  1128*  --   --  1060*  --   APTT 29  --   --   --   --   --   --   --   --   --   --   LABPROT 15.6*  --   --   --   --   --   --   --   --   --   --   INR 1.28  --   --   --   --   --   --   --   --   --   --   HEPARINUNFRC  --   --   --   < >  --   < > <0.10* 0.26* 0.11*  --  0.36  CREATININE  --   < > 1.60*  --  1.82*  --  1.51*  --   --  1.28*  --   < > = values in this interval not displayed.  Estimated Creatinine Clearance: 23.2 mL/min (by C-G formula based on Cr of 1.28).   Assessment: CC: cold, colorless left foot 37 yoF c/o L foot changing color. IV Heparin per Rx for ischemic foot.   PMH: dementia (wheelchair bound), HTN, ischemic stroke, recent hospitalization 04/13/15-04/17/15 for treatment of cellulitis of the left leg treated with vancomycin and doxycycline.  Anticoag: IV Heparin per Rx for ischemic foot, only option per vascular surgery is above knee amputation. HL 0.36 now in goal range. Hgb 9.5. Thrombocytosis plts 1060.Pending family decision for left AKA  Goal of Therapy:  Heparin level 0.3-0.7 units/ml Monitor platelets by anticoagulation protocol: Yes   Plan:  Continue IV heparin at 1250 units/hr Daily HL and  CBC   Kassity Woodson S. Alford Highland, PharmD, BCPS Clinical Staff Pharmacist Pager [phone]  Eilene Ghazi Stillinger 04/23/2015,9:22 AM

## 2015-04-24 LAB — BASIC METABOLIC PANEL
Anion gap: 8 (ref 5–15)
BUN: 25 mg/dL — AB (ref 6–20)
CHLORIDE: 110 mmol/L (ref 101–111)
CO2: 21 mmol/L — ABNORMAL LOW (ref 22–32)
CREATININE: 1.2 mg/dL — AB (ref 0.44–1.00)
Calcium: 8.4 mg/dL — ABNORMAL LOW (ref 8.9–10.3)
GFR calc Af Amer: 46 mL/min — ABNORMAL LOW (ref >60)
GFR calc non Af Amer: 40 mL/min — ABNORMAL LOW (ref >60)
Glucose, Bld: 110 mg/dL — ABNORMAL HIGH (ref 65–99)
Potassium: 3.9 mmol/L (ref 3.5–5.1)
SODIUM: 139 mmol/L (ref 135–145)

## 2015-04-24 LAB — RETICULOCYTES
RBC.: 4.21 MIL/uL (ref 3.87–5.11)
RETIC COUNT ABSOLUTE: 75.8 10*3/uL (ref 19.0–186.0)
Retic Ct Pct: 1.8 % (ref 0.4–3.1)

## 2015-04-24 LAB — FOLATE: Folate: 26.4 ng/mL (ref >5.9)

## 2015-04-24 LAB — IRON AND TIBC
Iron: 15 ug/dL — ABNORMAL LOW (ref 28–170)
Saturation Ratios: 7 % — ABNORMAL LOW (ref 10.4–31.8)
TIBC: 206 ug/dL — ABNORMAL LOW (ref 250–450)
UIBC: 191 ug/dL

## 2015-04-24 LAB — CBC
HCT: 28.1 % — ABNORMAL LOW (ref 36.0–46.0)
HEMOGLOBIN: 9.1 g/dL — AB (ref 12.0–15.0)
MCH: 24.6 pg — AB (ref 26.0–34.0)
MCHC: 32.4 g/dL (ref 30.0–36.0)
MCV: 75.9 fL — ABNORMAL LOW (ref 78.0–100.0)
PLATELETS: 1072 10*3/uL — AB (ref 150–400)
RBC: 3.7 MIL/uL — ABNORMAL LOW (ref 3.87–5.11)
RDW: 16.9 % — AB (ref 11.5–15.5)
WBC: 13.5 10*3/uL — ABNORMAL HIGH (ref 4.0–10.5)

## 2015-04-24 LAB — FERRITIN: Ferritin: 134 ng/mL (ref 11–307)

## 2015-04-24 LAB — HEPARIN LEVEL (UNFRACTIONATED): Heparin Unfractionated: 0.15 IU/mL — ABNORMAL LOW (ref 0.30–0.70)

## 2015-04-24 LAB — VITAMIN B12: VITAMIN B 12: 670 pg/mL (ref 180–914)

## 2015-04-24 MED ORDER — HEPARIN SODIUM (PORCINE) 5000 UNIT/ML IJ SOLN
5000.0000 [IU] | Freq: Three times a day (TID) | INTRAMUSCULAR | Status: DC
  Administered 2015-04-24 – 2015-04-25 (×3): 5000 [IU] via SUBCUTANEOUS
  Filled 2015-04-24 (×3): qty 1

## 2015-04-24 MED ORDER — HEPARIN BOLUS VIA INFUSION
1000.0000 [IU] | Freq: Once | INTRAVENOUS | Status: AC
  Administered 2015-04-24: 1000 [IU] via INTRAVENOUS
  Filled 2015-04-24: qty 1000

## 2015-04-24 MED ORDER — ENSURE ENLIVE PO LIQD
237.0000 mL | Freq: Two times a day (BID) | ORAL | Status: DC
  Administered 2015-04-24 – 2015-04-29 (×7): 237 mL via ORAL

## 2015-04-24 NOTE — Progress Notes (Signed)
ANTICOAGULATION CONSULT NOTE - Follow Up Consult  Pharmacy Consult for heparin Indication: ischemic foot   Labs:  Recent Labs  04/21/15 1006  04/22/15 0430  04/22/15 2200 04/23/15 0420 04/23/15 0845 04/24/15 0638  HGB 11.5*  < > 10.0*  --   --  9.5*  --  9.1*  HCT 35.4*  < > 30.9*  --   --  29.9*  --  28.1*  PLT 1158*  --  1128*  --   --  1060*  --  1072*  APTT 29  --   --   --   --   --   --   --   LABPROT 15.6*  --   --   --   --   --   --   --   INR 1.28  --   --   --   --   --   --   --   HEPARINUNFRC  --   < > <0.10*  < > 0.11*  --  0.36 0.15*  CREATININE  --   < > 1.51*  --   --  1.28*  --  1.20*  < > = values in this interval not displayed.   Assessment: 82yo female now subtherapeutic on heparin after one level at low end of goal.  Goal of Therapy:  Heparin level 0.3-0.7 units/ml   Plan:  Will rebolus with heparin 1000 units and increase gtt by 3 units/kg/hr to 1400 units/hr and check level in [place], PharmD, BCPS  04/24/2015,7:46 AM

## 2015-04-24 NOTE — Care Management Important Message (Signed)
Important Message  Patient Details  Name: Rachel [name] MRN: [id number] Date of Birth: 29-Jan-1928   Medicare Important Message Given:  Yes    [name] [name] 04/24/2015, 3:16 PM

## 2015-04-24 NOTE — Progress Notes (Signed)
Subjective: Interval History: none.. Minimally arousable this morning.  Objective: Vital signs in last 24 hours: Temp:  [98.8 F (37.1 C)-100.3 F (37.9 C)] 100.3 F (37.9 C) (01/31 0536) Pulse Rate:  [77-95] 81 (01/31 0536) Resp:  [18] 18 (01/31 0536) BP: (126-153)/(49-72) 130/49 mmHg (01/31 0536) SpO2:  [97 %-98 %] 97 % (01/31 0536)  Intake/Output from previous day: 01/30 0701 - 01/31 0700 In: 720 [P.O.:720] Out: -  Intake/Output this shift:    No change in her left foot with progressive epidermal lysis.  Lab Results:  Recent Labs  04/23/15 0420 04/24/15 0638  WBC 13.2* 13.5*  HGB 9.5* 9.1*  HCT 29.9* 28.1*  PLT 1060* 1072*   BMET  Recent Labs  04/23/15 0420 04/24/15 0638  NA 142 139  K 4.0 3.9  CL 107 110  CO2 24 21*  GLUCOSE 101* 110*  BUN 35* 25*  CREATININE 1.28* 1.20*  CALCIUM 8.8* 8.4*    Studies/Results: Dg Tibia/fibula Left  04/13/2015  CLINICAL DATA:  Recent fall. Left leg pain and swelling. Initial encounter. EXAM: LEFT TIBIA AND FIBULA - 2 VIEW COMPARISON:  None. FINDINGS: There is no evidence of fracture or other focal bone lesions. Generalized osteopenia noted. Osteoarthritis seen involving lateral compartment knee. Extensive peripheral vascular calcification noted. Soft tissue swelling is seen along the lateral aspect of the fibular shaft. IMPRESSION: Lateral soft tissue swelling.  No evidence of fracture. Electronically Signed   By: Earle Gell M.D.   On: 04/13/2015 17:30   Dg Ankle Complete Left  04/13/2015  CLINICAL DATA:  Recent fall. Left ankle injury and pain. Initial encounter. EXAM: LEFT ANKLE COMPLETE - 3+ VIEW COMPARISON:  None. FINDINGS: There is no evidence of fracture, dislocation, or joint effusion. There is no evidence of arthropathy or other focal bone abnormality. Well corticated ossific densities are seen along the talar neck, likely due to old avulsion injuries or chronic trauma. Generalized osteopenia noted. Lateral soft  tissue swelling also demonstrated. IMPRESSION: Lateral soft tissue swelling. No evidence of acute fracture or dislocation. Electronically Signed   By: Earle Gell M.D.   On: 04/13/2015 17:32   Anti-infectives: Anti-infectives    None      Assessment/Plan: s/p * No surgery found * Discussed with the patient's power of attorney yesterday, Darrelyn Hillock. She wishes to proceed with left above-knee amputation. I did express to her that the this may be progressive terminal event for her grandmother. Plan surgery tomorrow.   LOS: 3 days   Curt Jews 04/24/2015, 8:56 AM

## 2015-04-24 NOTE — Progress Notes (Signed)
[facility]                                                                              Patient Demographics  Rachel [name], is a 82 y.o. female, DOB - Feb 18, 1928, [id number]  Admit date - 04/21/2015   Admitting Physician Venetia Maxon Rama, MD  Outpatient Primary MD for the patient is [facility], MD  LOS - 3   Chief Complaint  Patient presents with  . Foot Injury      HPI on 04/21/2015 by Dr. Jacquelynn Cree Rachel [name] is an 82 y.o. female with a PMH of dementia (wheelchair bound), HTN, ischemic stroke, recent hospitalization 04/13/15-04/17/15 for treatment of cellulitis of the left leg treated with vancomycin and doxycycline, who lives with her daughter and who noticed that her left foot appeared darker than normal last night. She discussed this with a family member who is an Therapist, sports who felt that the patient's circulation was compromised and recommended she massage it. The patient's daughter felt that the color improved with massage and placing a hot pack on the area. This morning, she felt that the dark color was worse, and that the color was not returning with massage, and brought her to the ED for evaluation. The patient's daughter reported pain with massage, but otherwise has not reported pain. Patient is currently without any complaints, although she is fairly lethargic and has a history of dementia. Upon initial evaluation in the ED, the patient has a frankly dusky appearing left foot. The ED physician spoke with Dr. Donnetta Hutching of vascular surgery who requested that the patient be transferred to [facility] for urgent consultation.  Assessment & Plan   Left Ischemic limb -Patient was transferred to [facility] for vascular surgery consult -Vascular surgery consultation appreciated, recommended above-the-knee amputation. -Ws on Plavix and IV heparin- discontinue after conversation with Dr. Donnetta Hutching -Plan is for AKA 04/25/15 (Granddaughter discussed with Dr.  Donnetta Hutching) -Does not appear to be infectious at this time  Hyperkalemia -Resolved, continue to monitor BMP  Thrombocytosis -Likely acute phase reactant due to limb ischemia -Continue to monitor CBC  Acute on chronic kidney failure, stage III -Creatinine improving, currently 1.20 -Lisinopril held -Likely secondary to poor oral intake -continue to monitor BMP  Pressure ulcer of the right heel, stage IV -Wound care consulted  Severe protein Malnutrition -Continue dysphagia 3 diet and nutritional supplements when awake  History of multiple sclerosis -Wheelchair bound at baseline -PT and OT consulted  Dementia -Stable?  Hypothyroidism -Continue Synthroid  Microcytic anemia -Hb 9.1, will continue to monitor CBC -Will obtain anemia panel, FOBT  Code Status: DO NOT RESUSCITATE  Family Communication: None at bedside  Disposition Plan: Admitted. Plan for Left AKA 04/25/15  Time Spent in minutes   30 minutes  Procedures  None  Consults   Vascular surgery Palliative care  DVT Prophylaxis  heparin  Lab Results  Component Value Date   PLT 1072* 04/24/2015    Medications  Scheduled Meds: . cholecalciferol  5,000 Units Oral Daily  . feeding supplement (ENSURE ENLIVE)  237 mL Oral BID BM  . heparin subcutaneous  5,000 Units Subcutaneous 3 times per day  . levothyroxine  25 mcg Oral QAC breakfast  . multivitamin with minerals  1 tablet Oral Daily  . pravastatin  20 mg Oral q1800  . sodium chloride flush  3 mL Intravenous Q12H   Continuous Infusions: . sodium chloride 100 mL/hr at 04/23/15 1519   PRN Meds:.acetaminophen **OR** acetaminophen, alum & mag hydroxide-simeth, bisacodyl, HYDROcodone-acetaminophen, morphine injection, ondansetron **OR** ondansetron (ZOFRAN) IV, oxyCODONE, senna-docusate  Antibiotics    Anti-infectives    None      Subjective:   Rachel [name] seen and examined today.   Patient denies pain at this time, denies chest pain, shortness  of breath, abdominal pain.  Does not know why she is here.   Objective:   Filed Vitals:   04/23/15 0604 04/23/15 1452 04/23/15 2220 04/24/15 0536  BP: 149/66 153/72 126/58 130/49  Pulse: 80 77 95 81  Temp: 99.1 F (37.3 C) 98.8 F (37.1 C) 99.3 F (37.4 C) 100.3 F (37.9 C)  TempSrc: Oral Oral Oral Oral  Resp: 18 18 18 18   Height:      Weight:      SpO2: 99% 98% 97% 97%    Wt Readings from Last 3 Encounters:  04/21/15 45.813 kg (101 lb)  04/14/15 45.9 kg (101 lb 3.1 oz)  02/28/15 45.859 kg (101 lb 1.6 oz)     Intake/Output Summary (Last 24 hours) at 04/24/15 1104 Last data filed at 04/24/15 0800  Gross per 24 hour  Intake    505 ml  Output      0 ml  Net    505 ml    Exam  General: Well developed, elderly, frail, NAD  HEENT: NCAT, mucous membranes mildly moist  Cardiovascular: S1 S2 auscultated, RRR, soft murmur  Respiratory: Clear to auscultation  Abdomen: Soft, nontender, nondistended, + bowel sounds  Extremities: warm dry without cyanosis clubbing RLE.   Neuro: Awake and alert to self, nonfocal  Skin: LLE with erythema, duskiness, cold to touch, quarter size ulcer noted on foot. Hematoma also noted.  Data Review   Micro Results No results found for this or any previous visit (from the past 240 hour(s)).  Radiology Reports Dg Tibia/fibula Left  04/13/2015  CLINICAL DATA:  Recent fall. Left leg pain and swelling. Initial encounter. EXAM: LEFT TIBIA AND FIBULA - 2 VIEW COMPARISON:  None. FINDINGS: There is no evidence of fracture or other focal bone lesions. Generalized osteopenia noted. Osteoarthritis seen involving lateral compartment knee. Extensive peripheral vascular calcification noted. Soft tissue swelling is seen along the lateral aspect of the fibular shaft. IMPRESSION: Lateral soft tissue swelling.  No evidence of fracture. Electronically Signed   By: Earle Gell M.D.   On: 04/13/2015 17:30   Dg Ankle Complete Left  04/13/2015  CLINICAL DATA:   Recent fall. Left ankle injury and pain. Initial encounter. EXAM: LEFT ANKLE COMPLETE - 3+ VIEW COMPARISON:  None. FINDINGS: There is no evidence of fracture, dislocation, or joint effusion. There is no evidence of arthropathy or other focal bone abnormality. Well corticated ossific densities are seen along the talar neck, likely due to old avulsion injuries or chronic trauma. Generalized osteopenia noted. Lateral soft tissue swelling also demonstrated. IMPRESSION: Lateral soft tissue swelling. No evidence of acute fracture or dislocation. Electronically Signed   By: Earle Gell M.D.   On: 04/13/2015 17:32    CBC  Recent Labs Lab 04/21/15 1006 04/21/15 1012 04/22/15 0430 04/23/15 0420 04/24/15 0638  WBC 16.1*  --  15.3* 13.2* 13.5*  HGB 11.5* 13.6 10.0* 9.5*  9.1*  HCT 35.4* 40.0 30.9* 29.9* 28.1*  PLT 1158*  --  1128* 1060* 1072*  MCV 76.1*  --  75.7* 76.1* 75.9*  MCH 24.7*  --  24.5* 24.2* 24.6*  MCHC 32.5  --  32.4 31.8 32.4  RDW 17.2*  --  16.7* 16.8* 16.9*  LYMPHSABS 1.4  --   --   --   --   MONOABS 1.1*  --   --   --   --   EOSABS 0.2  --   --   --   --   BASOSABS 0.0  --   --   --   --     Chemistries   Recent Labs Lab 04/21/15 1012 04/21/15 1106 04/22/15 0430 04/23/15 0420 04/24/15 0638  NA 139 143 140 142 139  K 6.7* 4.0 4.0 4.0 3.9  CL 106 106 108 107 110  CO2  --  26 26 24  21*  GLUCOSE 114* 117* 115* 101* 110*  BUN 75* 54* 48* 35* 25*  CREATININE 1.60* 1.82* 1.51* 1.28* 1.20*  CALCIUM  --  9.4 8.6* 8.8* 8.4*  AST  --  68*  --   --   --   ALT  --  35  --   --   --   ALKPHOS  --  72  --   --   --   BILITOT  --  0.5  --   --   --    ------------------------------------------------------------------------------------------------------------------ estimated creatinine clearance is 24.8 mL/min (by C-G formula based on Cr of 1.2). ------------------------------------------------------------------------------------------------------------------ No results for  input(s): HGBA1C in the last 72 hours. ------------------------------------------------------------------------------------------------------------------ No results for input(s): CHOL, HDL, LDLCALC, TRIG, CHOLHDL, LDLDIRECT in the last 72 hours. ------------------------------------------------------------------------------------------------------------------  Recent Labs  04/21/15 1106  TSH 13.958*   ------------------------------------------------------------------------------------------------------------------ No results for input(s): VITAMINB12, FOLATE, FERRITIN, TIBC, IRON, RETICCTPCT in the last 72 hours.  Coagulation profile  Recent Labs Lab 04/21/15 1006  INR 1.28    No results for input(s): DDIMER in the last 72 hours.  Cardiac Enzymes No results for input(s): CKMB, TROPONINI, MYOGLOBIN in the last 168 hours.  Invalid input(s): CK ------------------------------------------------------------------------------------------------------------------ Invalid input(s): POCBNP    Yuepheng Schaller D.O. on 04/24/2015 at 11:04 AM  Between 7am to 7pm - Pager - [phone]  After 7pm go to [email or web address] - password [email or web address]  And look for the night coverage person covering for me after hours  [facility] Office  [phone]

## 2015-04-24 NOTE — Progress Notes (Signed)
[facility]  Patient Status: Active (receiving services up to time of hospitalization)  [facility] is providing the following services: RN, PT and OT  If patient discharges after hours, please call [phone].   Rachel [name] [name] 04/24/2015, 10:32 AM

## 2015-04-24 NOTE — Progress Notes (Addendum)
Initial Nutrition Assessment  DOCUMENTATION CODES:   Severe malnutrition in context of chronic illness, Underweight  INTERVENTION:   Ensure Enlive po BID, each supplement provides 350 kcal and 20 grams of protein   Continue MVI with minerals daily  NUTRITION DIAGNOSIS:   Malnutrition related to chronic illness as evidenced by severe depletion of body fat, severe depletion of muscle mass  GOAL:   Patient will meet greater than or equal to 90% of their needs  MONITOR:   PO intake, Supplement acceptance, Labs, Weight trends, Skin, I & O's  REASON FOR ASSESSMENT:   Low Braden  ASSESSMENT:   82 y.o. Female with a PMH of dementia (wheelchair bound), HTN, ischemic stroke, recent hospitalization 04/13/15-04/17/15 for treatment of cellulitis of the left leg treated with vancomycin and doxycycline, who lives with her daughter has been admitted with L LE ischemic limb. She has been seen by vascular surgery with recommendations for above the knee amputation.   RD unable to obtain nutrition hx.  Pt sleeping.  + visible severe muscle and fat loss to upper body.  Seen per RD at [facility] during previous admission.  CWOCN note reviewed.  Was receiving Ensure Enlive supplements -- this RD to order at this time.   Pt with R heel DTPI (deep tissue pressure injury).  Palliative Care Team following.  Pending family decision for left AKA.  Diet Order:  DIET DYS 3 Room service appropriate?: Yes; Fluid consistency:: Thin  Skin:  Wound (see comment) (unstagebale pressure ulcer to heel)  Last BM:  1/29  Height:   Ht Readings from Last 1 Encounters:  04/21/15 5\' 4"  (1.626 m)    Weight:   Wt Readings from Last 1 Encounters:  04/21/15 101 lb (45.813 kg)    Ideal Body Weight:  54.5 kg  BMI:  Body mass index is 17.33 kg/(m^2).  Estimated Nutritional Needs:   Kcal:  1300-1500  Protein:  65-75 gm  Fluid:  >/= 1.5 L  EDUCATION NEEDS:   No education needs identified at this  time  Arthur Holms, RD, LDN Pager #: [phone] After-Hours Pager #: [phone]

## 2015-04-25 ENCOUNTER — Inpatient Hospital Stay (HOSPITAL_COMMUNITY): Payer: Medicare HMO | Admitting: Certified Registered Nurse Anesthetist

## 2015-04-25 ENCOUNTER — Encounter (HOSPITAL_COMMUNITY)
Admission: EM | Disposition: A | Discharge status: Home Health | Payer: Self-pay | Source: Home / Self Care | Attending: Internal Medicine

## 2015-04-25 ENCOUNTER — Encounter (HOSPITAL_COMMUNITY): Payer: Self-pay | Admitting: Certified Registered Nurse Anesthetist

## 2015-04-25 DIAGNOSIS — L89614 Pressure ulcer of right heel, stage 4: Secondary | ICD-10-CM | POA: Diagnosis not present

## 2015-04-25 DIAGNOSIS — I96 Gangrene, not elsewhere classified: Secondary | ICD-10-CM

## 2015-04-25 DIAGNOSIS — N179 Acute kidney failure, unspecified: Secondary | ICD-10-CM

## 2015-04-25 DIAGNOSIS — N189 Chronic kidney disease, unspecified: Secondary | ICD-10-CM

## 2015-04-25 DIAGNOSIS — G92 Toxic encephalopathy: Secondary | ICD-10-CM | POA: Diagnosis not present

## 2015-04-25 DIAGNOSIS — I998 Other disorder of circulatory system: Principal | ICD-10-CM

## 2015-04-25 DIAGNOSIS — E43 Unspecified severe protein-calorie malnutrition: Secondary | ICD-10-CM | POA: Diagnosis not present

## 2015-04-25 HISTORY — PX: AMPUTATION: SHX166

## 2015-04-25 LAB — BASIC METABOLIC PANEL
ANION GAP: 8 (ref 5–15)
BUN: 21 mg/dL — AB (ref 6–20)
CALCIUM: 8.7 mg/dL — AB (ref 8.9–10.3)
CO2: 24 mmol/L (ref 22–32)
CREATININE: 1.2 mg/dL — AB (ref 0.44–1.00)
Chloride: 111 mmol/L (ref 101–111)
GFR calc Af Amer: 46 mL/min — ABNORMAL LOW (ref >60)
GFR, EST NON AFRICAN AMERICAN: 40 mL/min — AB (ref >60)
GLUCOSE: 104 mg/dL — AB (ref 65–99)
Potassium: 4.2 mmol/L (ref 3.5–5.1)
Sodium: 143 mmol/L (ref 135–145)

## 2015-04-25 LAB — GLUCOSE, CAPILLARY: Glucose-Capillary: 96 mg/dL (ref 65–99)

## 2015-04-25 LAB — CBC
HEMATOCRIT: 29.9 % — AB (ref 36.0–46.0)
Hemoglobin: 9.7 g/dL — ABNORMAL LOW (ref 12.0–15.0)
MCH: 24.6 pg — AB (ref 26.0–34.0)
MCHC: 32.4 g/dL (ref 30.0–36.0)
MCV: 75.9 fL — AB (ref 78.0–100.0)
PLATELETS: 1172 10*3/uL — AB (ref 150–400)
RBC: 3.94 MIL/uL (ref 3.87–5.11)
RDW: 16.9 % — AB (ref 11.5–15.5)
WBC: 13.2 10*3/uL — ABNORMAL HIGH (ref 4.0–10.5)

## 2015-04-25 SURGERY — AMPUTATION, ABOVE KNEE
Anesthesia: General | Site: Leg Upper | Laterality: Left

## 2015-04-25 MED ORDER — FENTANYL CITRATE (PF) 250 MCG/5ML IJ SOLN
INTRAMUSCULAR | Status: AC
  Filled 2015-04-25: qty 5

## 2015-04-25 MED ORDER — ROCURONIUM BROMIDE 100 MG/10ML IV SOLN
INTRAVENOUS | Status: DC | PRN
  Administered 2015-04-25: 30 mg via INTRAVENOUS

## 2015-04-25 MED ORDER — PANTOPRAZOLE SODIUM 40 MG PO TBEC
40.0000 mg | DELAYED_RELEASE_TABLET | Freq: Every day | ORAL | Status: DC
  Administered 2015-04-25 – 2015-04-29 (×5): 40 mg via ORAL
  Filled 2015-04-25 (×5): qty 1

## 2015-04-25 MED ORDER — LIDOCAINE HCL (CARDIAC) 20 MG/ML IV SOLN
INTRAVENOUS | Status: DC | PRN
  Administered 2015-04-25: 60 mg via INTRAVENOUS

## 2015-04-25 MED ORDER — PHENYLEPHRINE HCL 10 MG/ML IJ SOLN
10.0000 mg | INTRAVENOUS | Status: DC | PRN
  Administered 2015-04-25 (×2): 30 ug/min via INTRAVENOUS

## 2015-04-25 MED ORDER — CEFAZOLIN SODIUM-DEXTROSE 2-3 GM-% IV SOLR
INTRAVENOUS | Status: DC | PRN
  Administered 2015-04-25: 2 g via INTRAVENOUS

## 2015-04-25 MED ORDER — PROPOFOL 10 MG/ML IV BOLUS
INTRAVENOUS | Status: DC | PRN
  Administered 2015-04-25: 40 mg via INTRAVENOUS

## 2015-04-25 MED ORDER — HYDROMORPHONE HCL 1 MG/ML IJ SOLN: 0.2500 mg | INTRAMUSCULAR | Status: DC | PRN

## 2015-04-25 MED ORDER — PHENOL 1.4 % MT LIQD: 1.0000 | OROMUCOSAL | Status: DC | PRN

## 2015-04-25 MED ORDER — SUGAMMADEX SODIUM 200 MG/2ML IV SOLN
INTRAVENOUS | Status: AC
  Filled 2015-04-25: qty 2

## 2015-04-25 MED ORDER — SUGAMMADEX SODIUM 200 MG/2ML IV SOLN
INTRAVENOUS | Status: DC | PRN
  Administered 2015-04-25: 100 mg via INTRAVENOUS

## 2015-04-25 MED ORDER — ONDANSETRON HCL 4 MG/2ML IJ SOLN
INTRAMUSCULAR | Status: DC | PRN
  Administered 2015-04-25: 4 mg via INTRAVENOUS

## 2015-04-25 MED ORDER — 0.9 % SODIUM CHLORIDE (POUR BTL) OPTIME
TOPICAL | Status: DC | PRN
Start: 2015-04-25 — End: 2015-04-25
  Administered 2015-04-25: 1000 mL

## 2015-04-25 MED ORDER — METOPROLOL TARTRATE 1 MG/ML IV SOLN: 2.0000 mg | INTRAVENOUS | Status: DC | PRN

## 2015-04-25 MED ORDER — DOCUSATE SODIUM 100 MG PO CAPS
100.0000 mg | ORAL_CAPSULE | Freq: Every day | ORAL | Status: DC
  Administered 2015-04-27 – 2015-04-29 (×3): 100 mg via ORAL
  Filled 2015-04-25 (×4): qty 1

## 2015-04-25 MED ORDER — DEXTROSE-NACL 5-0.9 % IV SOLN
INTRAVENOUS | Status: DC
  Administered 2015-04-25 – 2015-04-29 (×3): via INTRAVENOUS

## 2015-04-25 MED ORDER — HEPARIN SODIUM (PORCINE) 5000 UNIT/ML IJ SOLN
5000.0000 [IU] | Freq: Three times a day (TID) | INTRAMUSCULAR | Status: DC
  Administered 2015-04-25 – 2015-04-29 (×11): 5000 [IU] via SUBCUTANEOUS
  Filled 2015-04-25 (×11): qty 1

## 2015-04-25 MED ORDER — GUAIFENESIN-DM 100-10 MG/5ML PO SYRP: 15.0000 mL | ORAL_SOLUTION | ORAL | Status: DC | PRN

## 2015-04-25 MED ORDER — LABETALOL HCL 5 MG/ML IV SOLN: 10.0000 mg | INTRAVENOUS | Status: DC | PRN

## 2015-04-25 MED ORDER — POTASSIUM CHLORIDE CRYS ER 20 MEQ PO TBCR: 20.0000 meq | EXTENDED_RELEASE_TABLET | Freq: Every day | ORAL | Status: DC | PRN

## 2015-04-25 MED ORDER — FENTANYL CITRATE (PF) 100 MCG/2ML IJ SOLN
INTRAMUSCULAR | Status: DC | PRN
  Administered 2015-04-25 (×5): 50 ug via INTRAVENOUS

## 2015-04-25 MED ORDER — DEXTROSE 5 % IV SOLN
1.5000 g | Freq: Two times a day (BID) | INTRAVENOUS | Status: AC
  Administered 2015-04-25 – 2015-04-26 (×2): 1.5 g via INTRAVENOUS
  Filled 2015-04-25 (×2): qty 1.5

## 2015-04-25 MED ORDER — HYDRALAZINE HCL 20 MG/ML IJ SOLN: 5.0000 mg | INTRAMUSCULAR | Status: DC | PRN

## 2015-04-25 SURGICAL SUPPLY — 45 items
BANDAGE ACE 4X5 VEL STRL LF (GAUZE/BANDAGES/DRESSINGS) ×4 IMPLANT
BANDAGE ACE 6X5 VEL STRL LF (GAUZE/BANDAGES/DRESSINGS) ×2 IMPLANT
BANDAGE ELASTIC 4 VELCRO ST LF (GAUZE/BANDAGES/DRESSINGS) ×2 IMPLANT
BANDAGE ELASTIC 6 VELCRO ST LF (GAUZE/BANDAGES/DRESSINGS) IMPLANT
BANDAGE ESMARK 6X9 LF (GAUZE/BANDAGES/DRESSINGS) IMPLANT
BNDG ESMARK 6X9 LF (GAUZE/BANDAGES/DRESSINGS)
BNDG GAUZE ELAST 4 BULKY (GAUZE/BANDAGES/DRESSINGS) ×2 IMPLANT
CANISTER SUCTION 2500CC (MISCELLANEOUS) ×2 IMPLANT
CLIP LIGATING EXTRA MED SLVR (CLIP) ×2 IMPLANT
CLIP LIGATING EXTRA SM BLUE (MISCELLANEOUS) ×2 IMPLANT
COVER SURGICAL LIGHT HANDLE (MISCELLANEOUS) ×2 IMPLANT
CUFF TOURNIQUET SINGLE 34IN LL (TOURNIQUET CUFF) IMPLANT
CUFF TOURNIQUET SINGLE 44IN (TOURNIQUET CUFF) IMPLANT
DRAIN SNY 10X20 3/4 PERF (WOUND CARE) IMPLANT
DRAPE ORTHO SPLIT 77X108 STRL (DRAPES) ×2
DRAPE PROXIMA HALF (DRAPES) ×2 IMPLANT
DRAPE SURG ORHT 6 SPLT 77X108 (DRAPES) ×2 IMPLANT
ELECT REM PT RETURN 9FT ADLT (ELECTROSURGICAL) ×2
ELECTRODE REM PT RTRN 9FT ADLT (ELECTROSURGICAL) ×1 IMPLANT
EVACUATOR SILICONE 100CC (DRAIN) IMPLANT
GAUZE SPONGE 4X4 12PLY STRL (GAUZE/BANDAGES/DRESSINGS) ×2 IMPLANT
GAUZE XEROFORM 5X9 LF (GAUZE/BANDAGES/DRESSINGS) ×2 IMPLANT
GLOVE BIOGEL PI IND STRL 6.5 (GLOVE) ×2 IMPLANT
GLOVE BIOGEL PI INDICATOR 6.5 (GLOVE) ×2
GLOVE SS BIOGEL STRL SZ 7.5 (GLOVE) ×1 IMPLANT
GLOVE SUPERSENSE BIOGEL SZ 7.5 (GLOVE) ×1
GLOVE SURG SS PI 6.5 STRL IVOR (GLOVE) ×2 IMPLANT
GOWN STRL REUS W/ TWL LRG LVL3 (GOWN DISPOSABLE) ×3 IMPLANT
GOWN STRL REUS W/TWL LRG LVL3 (GOWN DISPOSABLE) ×3
KIT BASIN OR (CUSTOM PROCEDURE TRAY) ×2 IMPLANT
KIT ROOM TURNOVER OR (KITS) ×2 IMPLANT
NS IRRIG 1000ML POUR BTL (IV SOLUTION) ×2 IMPLANT
PACK GENERAL/GYN (CUSTOM PROCEDURE TRAY) ×2 IMPLANT
PAD ARMBOARD 7.5X6 YLW CONV (MISCELLANEOUS) ×4 IMPLANT
PADDING CAST COTTON 6X4 STRL (CAST SUPPLIES) IMPLANT
SAW GIGLI STERILE 20 (MISCELLANEOUS) ×2 IMPLANT
SPONGE GAUZE 4X4 12PLY STER LF (GAUZE/BANDAGES/DRESSINGS) ×2 IMPLANT
STAPLER VISISTAT 35W (STAPLE) ×2 IMPLANT
STOCKINETTE IMPERVIOUS LG (DRAPES) ×2 IMPLANT
SUT ETHILON 3 0 PS 1 (SUTURE) IMPLANT
SUT VIC AB 0 CT1 18XCR BRD 8 (SUTURE) ×2 IMPLANT
SUT VIC AB 0 CT1 8-18 (SUTURE) ×2
SUT VICRYL AB 2 0 TIES (SUTURE) ×2 IMPLANT
UNDERPAD 30X30 INCONTINENT (UNDERPADS AND DIAPERS) ×2 IMPLANT
WATER STERILE IRR 1000ML POUR (IV SOLUTION) ×2 IMPLANT

## 2015-04-25 NOTE — Progress Notes (Signed)
Pt will open eyes when name called does not follow any commands dr early at bedside states this was same as pre-op

## 2015-04-25 NOTE — Anesthesia Preprocedure Evaluation (Addendum)
Anesthesia Evaluation  Patient identified by MRN, date of birth, ID band Patient awake    Reviewed: Allergy & Precautions, H&P , NPO status , Patient's Chart, lab work & pertinent test results  Airway Mallampati: II  TM Distance: >3 FB Neck ROM: Full    Dental no notable dental hx. (+) Upper Dentures, Lower Dentures, Dental Advisory Given   Pulmonary neg pulmonary ROS,    Pulmonary exam normal breath sounds clear to auscultation       Cardiovascular hypertension, Pt. on medications negative cardio ROS   Rhythm:Regular Rate:Normal + Systolic murmurs    Neuro/Psych Dementia CVA negative neurological ROS  negative psych ROS   GI/Hepatic negative GI ROS, Neg liver ROS,   Endo/Other  negative endocrine ROSHypothyroidism   Renal/GU Renal InsufficiencyRenal disease  negative genitourinary   Musculoskeletal   Abdominal   Peds  Hematology negative hematology ROS (+)   Anesthesia Other Findings   Reproductive/Obstetrics negative OB ROS                            Anesthesia Physical Anesthesia Plan  ASA: III  Anesthesia Plan: General   Post-op Pain Management:    Induction: Intravenous  Airway Management Planned: LMA and Oral ETT  Additional Equipment:   Intra-op Plan:   Post-operative Plan: Extubation in OR  Informed Consent: I have reviewed the patients History and Physical, chart, labs and discussed the procedure including the risks, benefits and alternatives for the proposed anesthesia with the patient or authorized representative who has indicated his/her understanding and acceptance.   Dental advisory given  Plan Discussed with: CRNA  Anesthesia Plan Comments:        Anesthesia Quick Evaluation

## 2015-04-25 NOTE — Interval H&P Note (Signed)
History and Physical Interval Note:  04/25/2015 11:40 AM  Rachel [name]  has presented today for surgery, with the diagnosis of Nonviable tissue left lower extremity  The various methods of treatment have been discussed with the patient and family. After consideration of risks, benefits and other options for treatment, the patient has consented to  Procedure(s): AMPUTATION ABOVE KNEE (Left) as a surgical intervention .  The patient's history has been reviewed, patient examined, no change in status, stable for surgery.  I have reviewed the patient's chart and labs.  Questions were answered to the patient's satisfaction.     Curt Jews

## 2015-04-25 NOTE — Op Note (Signed)
    OPERATIVE REPORT  DATE OF SURGERY: 04/25/2015  PATIENT: Rachel [name], 82 y.o. female MRN: [id number]  DOB: 08-Aug-1927  PRE-OPERATIVE DIAGNOSIS: Gangrene left foot  POST-OPERATIVE DIAGNOSIS:  Same  PROCEDURE: Left above-knee amputation  SURGEON:  Curt Jews, M.D.  PHYSICIAN ASSISTANT: Samantha Rhyne PA-C  ANESTHESIA:  Gen.  EBL: Less than 100 ml  Total I/O In: 300 [I.V.:300] Out: 150 [Blood:150]  BLOOD ADMINISTERED: None  DRAINS: None  SPECIMEN: Above-knee amputation specimen  COUNTS CORRECT:  YES  PLAN OF CARE: PACU   PATIENT DISPOSITION:  PACU - hemodynamically stable  PROCEDURE DETAILS: The patient was taken to the operative placed supine position where the area of the left eye left leg sterile fashion. Using a fishmouth type incision, incision was made just above the knee and carried down through the subcutaneous tissue with electrocautery. There was extensive edema present but all the tissues were viable. The fascia and muscles were divided in line with the skin incision. Electrocautery was used for control small vessels. Artery vessels were ligated with 0 Vicryls ties and divided. 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 This was ligated with a 0 Bartle tie. The superficial femoral vein was also ligated and divided. The sciatic nerve was ligated and divided as well. Periosteum was elevated from the femur and the femur was divided with a Gigli saw. The femur was divided approximately 4-5 cm proximal to the skin incision. The bone edges were smoothed with a bone rasp. The fascia was closed with anterior fascia approximated to the posterior fascia with interrupted 0 Vicryls figure-of-eight sutures. Skin was closed with skin staples. Sterile dressing and Ace wrap were applied. The patient transferred to the recovery room in stable condition   Curt Jews, M.D. 04/25/2015 1:29 PM

## 2015-04-25 NOTE — Anesthesia Procedure Notes (Signed)
Procedure Name: Intubation Date/Time: 04/25/2015 12:12 PM Performed by: Salli Quarry Rie Mcneil Pre-anesthesia Checklist: Patient identified, Emergency Drugs available, Suction available and Patient being monitored Patient Re-evaluated:Patient Re-evaluated prior to inductionOxygen Delivery Method: Circle system utilized Preoxygenation: Pre-oxygenation with 100% oxygen Intubation Type: IV induction Ventilation: Mask ventilation without difficulty Laryngoscope Size: Mac and 3 Grade View: Grade I Tube type: Oral Tube size: 7.5 mm Number of attempts: 1 Airway Equipment and Method: Stylet Placement Confirmation: ETT inserted through vocal cords under direct vision,  positive ETCO2 and breath sounds checked- equal and bilateral Secured at: 22 cm Tube secured with: Tape Dental Injury: Teeth and Oropharynx as per pre-operative assessment

## 2015-04-25 NOTE — Anesthesia Postprocedure Evaluation (Signed)
Anesthesia Post Note  Patient: Rachel [name]  Procedure(s) Performed: Procedure(s) (LRB): AMPUTATION ABOVE KNEE (Left)  Patient location during evaluation: PACU Anesthesia Type: General Level of consciousness: awake and alert Pain management: pain level controlled Vital Signs Assessment: post-procedure vital signs reviewed and stable Respiratory status: spontaneous breathing, nonlabored ventilation and respiratory function stable Cardiovascular status: blood pressure returned to baseline and stable Postop Assessment: no signs of nausea or vomiting Anesthetic complications: no    Last Vitals:  Filed Vitals:   04/25/15 1400 04/25/15 1435  BP: 150/58 147/63  Pulse: 71 72  Temp:  36.8 C  Resp: 11 12    Last Pain:  Filed Vitals:   04/25/15 1437  PainSc: 0-No pain                 Tailor Lucking,W. EDMOND

## 2015-04-25 NOTE — Transfer of Care (Signed)
Immediate Anesthesia Transfer of Care Note  Patient: Rachel [name]  Procedure(s) Performed: Procedure(s): AMPUTATION ABOVE KNEE (Left)  Patient Location: PACU  Anesthesia Type:General  Level of Consciousness: awake, patient cooperative and responds to stimulation, drowsy  Airway & Oxygen Therapy: Patient Spontanous Breathing and Patient connected to nasal cannula oxygen  Post-op Assessment: Report given to RN and Post -op Vital signs reviewed and stable  Post vital signs: Reviewed and stable  Last Vitals:  Filed Vitals:   04/24/15 2025 04/25/15 0632  BP: 151/53 164/59  Pulse:  94  Temp:  38.1 C  Resp:  18    Complications: No apparent anesthesia complications

## 2015-04-25 NOTE — Progress Notes (Signed)
[facility]                                                                              Patient Demographics  Rachel [name], is a 82 y.o. female, DOB - 04/10/1927, OX:[id number]  Admit date - 04/21/2015   Admitting Physician Venetia Maxon Rama, MD  Outpatient Primary MD for the patient is [facility], [place]  LOS - 4   Chief Complaint  Patient presents with  . Foot Injury      HPI on 04/21/2015 by Dr. Jacquelynn Cree Rachel [name] is an 82 y.o. female with a PMH of dementia (wheelchair bound), HTN, ischemic stroke, recent hospitalization 04/13/15-04/17/15 for treatment of cellulitis of the left leg treated with vancomycin and doxycycline, who lives with her daughter and who noticed that her left foot appeared darker than normal last night. She discussed this with a family member who is an Therapist, sports who felt that the patient's circulation was compromised and recommended she massage it. The patient's daughter felt that the color improved with massage and placing a hot pack on the area. This morning, she felt that the dark color was worse, and that the color was not returning with massage, and brought her to the ED for evaluation. The patient's daughter reported pain with massage, but otherwise has not reported pain. Patient is currently without any complaints, although she is fairly lethargic and has a history of dementia. Upon initial evaluation in the ED, the patient has a frankly dusky appearing left foot. The ED physician spoke with Dr. Donnetta Hutching of vascular surgery who requested that the patient be transferred to [facility] for urgent consultation.  Assessment & Plan   Left Ischemic limb -[facility] was transferred to [facility] for vascular surgery consult -Vascular surgery consultation appreciated, recommended above-the-knee amputation. -Ws on Plavix and IV heparin- discontinue after conversation with Dr. Donnetta Hutching -Plan is for AKA 04/25/15 (Granddaughter discussed with Dr.  Donnetta Hutching) -on cefuroxime for 2 doses.   Encephalopathy; lethargic post op, likely post anesthesia. Monitor.  Change IV fluids to D 5 NS.   Hyperkalemia -Resolved, continue to monitor BMP  Thrombocytosis -Likely acute phase reactant due to limb ischemia -Continue to monitor CBC Will need aspirin when ok by vascular.   Acute on chronic kidney failure, stage III -Creatinine improving, currently 1.20 -Lisinopril held -Likely secondary to poor oral intake -continue to monitor BMP  Pressure ulcer of the right heel, stage IV -Wound care consulted  Severe protein Malnutrition -Continue dysphagia 3 diet and nutritional supplements when awake  History of multiple sclerosis -Wheelchair bound at baseline -PT and OT consulted  Dementia -Stable?  Hypothyroidism -Continue Synthroid  Microcytic anemia, iron deficiency. Will start oral iron when taking PO.  -Hb 9.1, will continue to monitor CBC -anemia panel, FOBT  Code Status: DO NOT RESUSCITATE  Family Communication: granddaughter at bedside.   Disposition Plan:  Left AKA 04/25/15  Time Spent in minutes   30 minutes  Procedures  None  Consults   Vascular surgery Palliative care  DVT Prophylaxis  heparin  Lab Results  Component Value Date   PLT 1172* 04/25/2015    Medications  Scheduled Meds: . cefUROXime (ZINACEF)  IV  1.5 g Intravenous Q12H  . cholecalciferol  5,000 Units Oral Daily  . [START ON 04/26/2015] docusate sodium  100 mg Oral Daily  . feeding supplement (ENSURE ENLIVE)  237 mL Oral BID BM  . heparin subcutaneous  5,000 Units Subcutaneous 3 times per day  . levothyroxine  25 mcg Oral QAC breakfast  . multivitamin with minerals  1 tablet Oral Daily  . pantoprazole  40 mg Oral Daily  . pravastatin  20 mg Oral q1800  . sodium chloride flush  3 mL Intravenous Q12H   Continuous Infusions: . sodium chloride 10 mL/hr at 04/25/15 1152   PRN Meds:.acetaminophen **OR** acetaminophen, alum & mag  hydroxide-simeth, bisacodyl, guaiFENesin-dextromethorphan, hydrALAZINE, HYDROcodone-acetaminophen, labetalol, metoprolol, morphine injection, ondansetron **OR** ondansetron (ZOFRAN) IV, phenol, potassium chloride, senna-docusate  Antibiotics    Anti-infectives    Start     Dose/Rate Route Frequency Ordered Stop   04/25/15 2030  cefUROXime (ZINACEF) 1.5 g in dextrose 5 % 50 mL IVPB     1.5 g 100 mL/hr over 30 Minutes Intravenous Every 12 hours 04/25/15 1416 04/26/15 2029      Subjective:   [name] seen and examined today. Patient just came form surgery. She is sleepy, move right arm. Keep eyes close.   Objective:   Filed Vitals:   04/25/15 1335 04/25/15 1345 04/25/15 1400 04/25/15 1435  BP: 132/76 146/64 150/58 147/63  Pulse: 80 72 71 72  Temp: 99.6 F (37.6 C)   98.2 F (36.8 C)  TempSrc:    Axillary  Resp: 12 11 11 12   Height:      Weight:      SpO2: 100% 100% 100% 92%    Wt Readings from Last 3 Encounters:  04/21/15 45.813 kg (101 lb)  04/14/15 45.9 kg (101 lb 3.1 oz)  02/28/15 45.859 kg (101 lb 1.6 oz)     Intake/Output Summary (Last 24 hours) at 04/25/15 1519 Last data filed at 04/25/15 1321  Gross per 24 hour  Intake    780 ml  Output    150 ml  Net    630 ml    Exam  General: elderly, frail, NAD  HEENT: NCAT, mucous membranes mildly moist  Cardiovascular: S1 S2 auscultated, RRR, soft murmur  Respiratory: Clear to auscultation  Abdomen: Soft, nontender, nondistended, + bowel sounds  Extremities: Left AKA with dressing.    Data Review   Micro Results No results found for this or any previous visit (from the past 240 hour(s)).  Radiology Reports Dg Tibia/fibula Left  04/13/2015  CLINICAL DATA:  Recent fall. Left leg pain and swelling. Initial encounter. EXAM: LEFT TIBIA AND FIBULA - 2 VIEW COMPARISON:  None. FINDINGS: There is no evidence of fracture or other focal bone lesions. Generalized osteopenia noted. Osteoarthritis seen involving  lateral compartment knee. Extensive peripheral vascular calcification noted. Soft tissue swelling is seen along the lateral aspect of the fibular shaft. IMPRESSION: Lateral soft tissue swelling.  No evidence of fracture. Electronically Signed   By: Earle Gell M.D.   On: 04/13/2015 17:30   Dg Ankle Complete Left  04/13/2015  CLINICAL DATA:  Recent fall. Left ankle injury and pain. Initial encounter. EXAM: LEFT ANKLE COMPLETE - 3+ VIEW COMPARISON:  None. FINDINGS: There is no evidence of fracture, dislocation, or joint effusion. There is no evidence of arthropathy or other focal bone abnormality. Well corticated ossific densities are seen along the talar neck, likely due to old avulsion injuries or chronic trauma. Generalized osteopenia noted. Lateral soft  tissue swelling also demonstrated. IMPRESSION: Lateral soft tissue swelling. No evidence of acute fracture or dislocation. Electronically Signed   By: Earle Gell M.D.   On: 04/13/2015 17:32    CBC  Recent Labs Lab 04/21/15 1006 04/21/15 1012 04/22/15 0430 04/23/15 0420 04/24/15 0638 04/25/15 0330  WBC 16.1*  --  15.3* 13.2* 13.5* 13.2*  HGB 11.5* 13.6 10.0* 9.5* 9.1* 9.7*  HCT 35.4* 40.0 30.9* 29.9* 28.1* 29.9*  PLT 1158*  --  1128* 1060* 1072* 1172*  MCV 76.1*  --  75.7* 76.1* 75.9* 75.9*  MCH 24.7*  --  24.5* 24.2* 24.6* 24.6*  MCHC 32.5  --  32.4 31.8 32.4 32.4  RDW 17.2*  --  16.7* 16.8* 16.9* 16.9*  LYMPHSABS 1.4  --   --   --   --   --   MONOABS 1.1*  --   --   --   --   --   EOSABS 0.2  --   --   --   --   --   BASOSABS 0.0  --   --   --   --   --     Chemistries   Recent Labs Lab 04/21/15 1106 04/22/15 0430 04/23/15 0420 04/24/15 0638 04/25/15 0330  NA 143 140 142 139 143  K 4.0 4.0 4.0 3.9 4.2  CL 106 108 107 110 111  CO2 26 26 24  21* 24  GLUCOSE 117* 115* 101* 110* 104*  BUN 54* 48* 35* 25* 21*  CREATININE 1.82* 1.51* 1.28* 1.20* 1.20*  CALCIUM 9.4 8.6* 8.8* 8.4* 8.7*  AST 68*  --   --   --   --   ALT 35   --   --   --   --   ALKPHOS 72  --   --   --   --   BILITOT 0.5  --   --   --   --    ------------------------------------------------------------------------------------------------------------------ estimated creatinine clearance is 24.8 mL/min (by C-G formula based on Cr of 1.2). ------------------------------------------------------------------------------------------------------------------ No results for input(s): HGBA1C in the last 72 hours. ------------------------------------------------------------------------------------------------------------------ No results for input(s): CHOL, HDL, LDLCALC, TRIG, CHOLHDL, LDLDIRECT in the last 72 hours. ------------------------------------------------------------------------------------------------------------------ No results for input(s): TSH, T4TOTAL, T3FREE, THYROIDAB in the last 72 hours.  Invalid input(s): FREET3 ------------------------------------------------------------------------------------------------------------------  Recent Labs  04/24/15 1212  VITAMINB12 670  FOLATE 26.4  FERRITIN 134  TIBC 206*  IRON 15*  RETICCTPCT 1.8    Coagulation profile  Recent Labs Lab 04/21/15 1006  INR 1.28    No results for input(s): DDIMER in the last 72 hours.  Cardiac Enzymes No results for input(s): CKMB, TROPONINI, MYOGLOBIN in the last 168 hours.  Invalid input(s): CK ------------------------------------------------------------------------------------------------------------------ Invalid input(s): POCBNP    Edilia Ghuman A Md on 04/25/2015 at 3:19 PM  Between 7am to 7pm - Pager - [phone]  After 7pm go to [email or web address] - password [email or web address]  And look for the night coverage person covering for me after hours  [facility] Office  [phone]

## 2015-04-25 NOTE — H&P (View-Only) (Signed)
Subjective: Interval History: none.. Minimally arousable this morning.  Objective: Vital signs in last 24 hours: Temp:  [98.8 F (37.1 C)-100.3 F (37.9 C)] 100.3 F (37.9 C) (01/31 0536) Pulse Rate:  [77-95] 81 (01/31 0536) Resp:  [18] 18 (01/31 0536) BP: (126-153)/(49-72) 130/49 mmHg (01/31 0536) SpO2:  [97 %-98 %] 97 % (01/31 0536)  Intake/Output from previous day: 01/30 0701 - 01/31 0700 In: 720 [P.O.:720] Out: -  Intake/Output this shift:    No change in her left foot with progressive epidermal lysis.  Lab Results:  Recent Labs  04/23/15 0420 04/24/15 0638  WBC 13.2* 13.5*  HGB 9.5* 9.1*  HCT 29.9* 28.1*  PLT 1060* 1072*   BMET  Recent Labs  04/23/15 0420 04/24/15 0638  NA 142 139  K 4.0 3.9  CL 107 110  CO2 24 21*  GLUCOSE 101* 110*  BUN 35* 25*  CREATININE 1.28* 1.20*  CALCIUM 8.8* 8.4*    Studies/Results: Dg Tibia/fibula Left  04/13/2015  CLINICAL DATA:  Recent fall. Left leg pain and swelling. Initial encounter. EXAM: LEFT TIBIA AND FIBULA - 2 VIEW COMPARISON:  None. FINDINGS: There is no evidence of fracture or other focal bone lesions. Generalized osteopenia noted. Osteoarthritis seen involving lateral compartment knee. Extensive peripheral vascular calcification noted. Soft tissue swelling is seen along the lateral aspect of the fibular shaft. IMPRESSION: Lateral soft tissue swelling.  No evidence of fracture. Electronically Signed   By: Earle Gell M.D.   On: 04/13/2015 17:30   Dg Ankle Complete Left  04/13/2015  CLINICAL DATA:  Recent fall. Left ankle injury and pain. Initial encounter. EXAM: LEFT ANKLE COMPLETE - 3+ VIEW COMPARISON:  None. FINDINGS: There is no evidence of fracture, dislocation, or joint effusion. There is no evidence of arthropathy or other focal bone abnormality. Well corticated ossific densities are seen along the talar neck, likely due to old avulsion injuries or chronic trauma. Generalized osteopenia noted. Lateral soft  tissue swelling also demonstrated. IMPRESSION: Lateral soft tissue swelling. No evidence of acute fracture or dislocation. Electronically Signed   By: Earle Gell M.D.   On: 04/13/2015 17:32   Anti-infectives: Anti-infectives    None      Assessment/Plan: s/p * No surgery found * Discussed with the patient's power of attorney yesterday, Darrelyn Hillock. She wishes to proceed with left above-knee amputation. I did express to her that the this may be progressive terminal event for her grandmother. Plan surgery tomorrow.   LOS: 3 days   Curt Jews 04/24/2015, 8:56 AM

## 2015-04-26 ENCOUNTER — Telehealth: Payer: Self-pay | Admitting: Vascular Surgery

## 2015-04-26 ENCOUNTER — Encounter (HOSPITAL_COMMUNITY): Payer: Self-pay | Admitting: Vascular Surgery

## 2015-04-26 DIAGNOSIS — F0390 Unspecified dementia without behavioral disturbance: Secondary | ICD-10-CM

## 2015-04-26 LAB — BASIC METABOLIC PANEL
ANION GAP: 8 (ref 5–15)
BUN: 18 mg/dL (ref 6–20)
CALCIUM: 8.5 mg/dL — AB (ref 8.9–10.3)
CO2: 21 mmol/L — ABNORMAL LOW (ref 22–32)
Chloride: 113 mmol/L — ABNORMAL HIGH (ref 101–111)
Creatinine, Ser: 1.31 mg/dL — ABNORMAL HIGH (ref 0.44–1.00)
GFR calc Af Amer: 42 mL/min — ABNORMAL LOW (ref >60)
GFR, EST NON AFRICAN AMERICAN: 36 mL/min — AB (ref >60)
GLUCOSE: 103 mg/dL — AB (ref 65–99)
POTASSIUM: 4.8 mmol/L (ref 3.5–5.1)
SODIUM: 142 mmol/L (ref 135–145)

## 2015-04-26 LAB — CBC
HEMATOCRIT: 28.9 % — AB (ref 36.0–46.0)
HEMOGLOBIN: 9.2 g/dL — AB (ref 12.0–15.0)
MCH: 24.2 pg — ABNORMAL LOW (ref 26.0–34.0)
MCHC: 31.8 g/dL (ref 30.0–36.0)
MCV: 76.1 fL — ABNORMAL LOW (ref 78.0–100.0)
Platelets: 1138 10*3/uL (ref 150–400)
RBC: 3.8 MIL/uL — ABNORMAL LOW (ref 3.87–5.11)
RDW: 16.7 % — AB (ref 11.5–15.5)
WBC: 11.8 10*3/uL — AB (ref 4.0–10.5)

## 2015-04-26 NOTE — Telephone Encounter (Signed)
Spoke with pts granddaughter, dpm

## 2015-04-26 NOTE — Progress Notes (Signed)
[facility]                                                                              Patient Demographics  Rachel [name], is a 82 y.o. female, DOB - 09-14-1927, OX:[id number]  Admit date - 04/21/2015   Admitting Physician Venetia Maxon Rama, MD  Outpatient Primary MD for the patient is [facility], MD  LOS - 5   Chief Complaint  Patient presents with  . Foot Injury      HPI on 04/21/2015 by Dr. Jacquelynn Cree Rachel [name] is an 82 y.o. female with a PMH of dementia (wheelchair bound), HTN, ischemic stroke, recent hospitalization 04/13/15-04/17/15 for treatment of cellulitis of the left leg treated with vancomycin and doxycycline, who lives with her daughter and who noticed that her left foot appeared darker than normal last night. She discussed this with a family member who is an Therapist, sports who felt that the patient's circulation was compromised and recommended she massage it. The patient's daughter felt that the color improved with massage and placing a hot pack on the area. This morning, she felt that the dark color was worse, and that the color was not returning with massage, and brought her to the ED for evaluation. The patient's daughter reported pain with massage, but otherwise has not reported pain. Patient is currently without any complaints, although she is fairly lethargic and has a history of dementia. Upon initial evaluation in the ED, the patient has a frankly dusky appearing left foot. The ED physician spoke with Dr. Donnetta Hutching of vascular surgery who requested that the patient be transferred to [facility] for urgent consultation.  Assessment & Plan   Left Ischemic limb S/P AKA 2-01 -Patient was transferred to [facility] for vascular surgery consult -Vascular surgery consultation appreciated, recommended above-the-knee amputation. -Ws on Plavix and IV heparin- discontinue after conversation with Dr. Donnetta Hutching -Plan is for AKA 04/25/15 (Granddaughter  discussed with Dr. Donnetta Hutching) -on cefuroxime for 2 doses.  -PT per vascular.   Encephalopathy; lethargic post op, likely post anesthesia. Resolved. Patient alert, following commands.   Hyperkalemia -Resolved, continue to monitor BMP  Thrombocytosis -Likely acute phase reactant due to limb ischemia -Continue to monitor CBC Will need aspirin when ok by vascular.   Acute on chronic kidney failure, stage III -Creatinine improving, currently 1.20 -Lisinopril held -Likely secondary to poor oral intake -continue to monitor BMP  Pressure ulcer of the right heel, stage IV -Wound care consulted  Severe protein Malnutrition -Continue dysphagia 3 diet and nutritional supplements when awake  History of multiple sclerosis -Wheelchair bound at baseline -PT and OT consulted  Dementia -Stable?  Hypothyroidism -Continue Synthroid  Microcytic anemia, iron deficiency. Will start oral iron when taking PO.  -Hb 9.1, will continue to monitor CBC -anemia panel, FOBT  Code Status: DO NOT RESUSCITATE  Family Communication: granddaughter at bedside.   Disposition Plan:  Left AKA 04/25/15  Time Spent in minutes   30 minutes  Procedures  None  Consults   Vascular surgery Palliative care  DVT Prophylaxis  heparin  Lab Results  Component Value Date   PLT 1138* 04/26/2015    Medications  Scheduled Meds: . cholecalciferol  5,000 Units Oral Daily  . docusate sodium  100 mg Oral Daily  . feeding supplement (ENSURE ENLIVE)  237 mL Oral BID BM  . heparin subcutaneous  5,000 Units Subcutaneous 3 times per day  . levothyroxine  25 mcg Oral QAC breakfast  . multivitamin with minerals  1 tablet Oral Daily  . pantoprazole  40 mg Oral Daily  . pravastatin  20 mg Oral q1800  . sodium chloride flush  3 mL Intravenous Q12H   Continuous Infusions: . dextrose 5 % and 0.9% NaCl 100 mL/hr at 04/25/15 1641   PRN Meds:.acetaminophen **OR** acetaminophen, alum & mag hydroxide-simeth, bisacodyl,  guaiFENesin-dextromethorphan, hydrALAZINE, HYDROcodone-acetaminophen, labetalol, metoprolol, morphine injection, ondansetron **OR** ondansetron (ZOFRAN) IV, phenol, potassium chloride, senna-docusate  Antibiotics    Anti-infectives    Start     Dose/Rate Route Frequency Ordered Stop   04/25/15 2030  cefUROXime (ZINACEF) 1.5 g in dextrose 5 % 50 mL IVPB     1.5 g 100 mL/hr over 30 Minutes Intravenous Every 12 hours 04/25/15 1416 04/26/15 1224      Subjective:   Rachel [name] , patient is more alert today , follow commands, denies pain   Objective:   Filed Vitals:   04/25/15 1400 04/25/15 1435 04/25/15 2159 04/26/15 0626  BP: 150/58 147/63 130/59 157/97  Pulse: 71 72 82 88  Temp:  98.2 F (36.8 C) 100 F (37.8 C) 98.4 F (36.9 C)  TempSrc:  Axillary Oral Axillary  Resp: 11 12 18 16   Height:      Weight:      SpO2: 100% 92% 96% 98%    Wt Readings from Last 3 Encounters:  04/21/15 45.813 kg (101 lb)  04/14/15 45.9 kg (101 lb 3.1 oz)  02/28/15 45.859 kg (101 lb 1.6 oz)     Intake/Output Summary (Last 24 hours) at 04/26/15 1254 Last data filed at 04/26/15 0935  Gross per 24 hour  Intake    780 ml  Output    150 ml  Net    630 ml    Exam  General: elderly, frail, NAD  HEENT: NCAT, mucous membranes mildly moist  Cardiovascular: S1 S2 auscultated, RRR, soft murmur  Respiratory: Clear to auscultation  Abdomen: Soft, nontender, nondistended, + bowel sounds  Extremities: Left AKA with dressing.      Data Review   Micro Results No results found for this or any previous visit (from the past 240 hour(s)).  Radiology Reports Dg Tibia/fibula Left  04/13/2015  CLINICAL DATA:  Recent fall. Left leg pain and swelling. Initial encounter. EXAM: LEFT TIBIA AND FIBULA - 2 VIEW COMPARISON:  None. FINDINGS: There is no evidence of fracture or other focal bone lesions. Generalized osteopenia noted. Osteoarthritis seen involving lateral compartment knee. Extensive  peripheral vascular calcification noted. Soft tissue swelling is seen along the lateral aspect of the fibular shaft. IMPRESSION: Lateral soft tissue swelling.  No evidence of fracture. Electronically Signed   By: Earle Gell M.D.   On: 04/13/2015 17:30   Dg Ankle Complete Left  04/13/2015  CLINICAL DATA:  Recent fall. Left ankle injury and pain. Initial encounter. EXAM: LEFT ANKLE COMPLETE - 3+ VIEW COMPARISON:  None. FINDINGS: There is no evidence of fracture, dislocation, or joint effusion. There is no evidence of arthropathy or other focal bone abnormality. Well corticated ossific densities are seen along the talar neck, likely due to old avulsion injuries or chronic trauma. Generalized osteopenia noted. Lateral soft tissue swelling also demonstrated. IMPRESSION: Lateral soft tissue swelling.  No evidence of acute fracture or dislocation. Electronically Signed   By: Earle Gell M.D.   On: 04/13/2015 17:32    CBC  Recent Labs Lab 04/21/15 1006  04/22/15 0430 04/23/15 0420 04/24/15 0638 04/25/15 0330 04/26/15 0400  WBC 16.1*  --  15.3* 13.2* 13.5* 13.2* 11.8*  HGB 11.5*  < > 10.0* 9.5* 9.1* 9.7* 9.2*  HCT 35.4*  < > 30.9* 29.9* 28.1* 29.9* 28.9*  PLT 1158*  --  1128* 1060* 1072* 1172* 1138*  MCV 76.1*  --  75.7* 76.1* 75.9* 75.9* 76.1*  MCH 24.7*  --  24.5* 24.2* 24.6* 24.6* 24.2*  MCHC 32.5  --  32.4 31.8 32.4 32.4 31.8  RDW 17.2*  --  16.7* 16.8* 16.9* 16.9* 16.7*  LYMPHSABS 1.4  --   --   --   --   --   --   MONOABS 1.1*  --   --   --   --   --   --   EOSABS 0.2  --   --   --   --   --   --   BASOSABS 0.0  --   --   --   --   --   --   < > = values in this interval not displayed.  Chemistries   Recent Labs Lab 04/21/15 1106 04/22/15 0430 04/23/15 0420 04/24/15 0638 04/25/15 0330 04/26/15 0400  NA 143 140 142 139 143 142  K 4.0 4.0 4.0 3.9 4.2 4.8  CL 106 108 107 110 111 113*  CO2 26 26 24  21* 24 21*  GLUCOSE 117* 115* 101* 110* 104* 103*  BUN 54* 48* 35* 25* 21* 18    CREATININE 1.82* 1.51* 1.28* 1.20* 1.20* 1.31*  CALCIUM 9.4 8.6* 8.8* 8.4* 8.7* 8.5*  AST 68*  --   --   --   --   --   ALT 35  --   --   --   --   --   ALKPHOS 72  --   --   --   --   --   BILITOT 0.5  --   --   --   --   --    ------------------------------------------------------------------------------------------------------------------ estimated creatinine clearance is 22.7 mL/min (by C-G formula based on Cr of 1.31). ------------------------------------------------------------------------------------------------------------------ No results for input(s): HGBA1C in the last 72 hours. ------------------------------------------------------------------------------------------------------------------ No results for input(s): CHOL, HDL, LDLCALC, TRIG, CHOLHDL, LDLDIRECT in the last 72 hours. ------------------------------------------------------------------------------------------------------------------ No results for input(s): TSH, T4TOTAL, T3FREE, THYROIDAB in the last 72 hours.  Invalid input(s): FREET3 ------------------------------------------------------------------------------------------------------------------  Recent Labs  04/24/15 1212  VITAMINB12 670  FOLATE 26.4  FERRITIN 134  TIBC 206*  IRON 15*  RETICCTPCT 1.8    Coagulation profile  Recent Labs Lab 04/21/15 1006  INR 1.28    No results for input(s): DDIMER in the last 72 hours.  Cardiac Enzymes No results for input(s): CKMB, TROPONINI, MYOGLOBIN in the last 168 hours.  Invalid input(s): CK ------------------------------------------------------------------------------------------------------------------ Invalid input(s): POCBNP    Regalado, Belkys A Md on 04/26/2015 at 12:54 PM  Between 7am to 7pm - Pager - [phone]  After 7pm go to [email or web address] - password [email or web address]  And look for the night coverage person covering for me after hours  [facility] Office  [phone]

## 2015-04-26 NOTE — Care Management Note (Signed)
Case Management Note  Patient Details  Name: Rachel [name] MRN: [id number] Date of Birth: June 03, 1927  Subjective/Objective:                  LLE swelling  Action/Plan:   Pt has dementia and is taken care of at home by granddaughterJoelene [name] [phone] or [phone].   Discharge planning Expected Discharge Date:  04/17/15               Expected Discharge Plan:     In-House Referral:     Discharge planning Services     Post Acute Care Choice:    Choice offered to:     DME Arranged:    DME Agency:     HH Arranged:    [place] Agency:     Status of Service:     Medicare Important Message Given:  Yes Date Medicare IM Given:    Medicare IM give by:    Date Additional Medicare IM Given:    Additional Medicare Important Message give by:     If discussed at [place] of Stay Meetings, dates discussed:    Additional Comments: 04/26/2015  CM left voicemail for [name] (grandaughter) to gain clarity on discharge plan.  CM requested bedside nurse to obtain PT evaluation from attending.  04/25/15 Pt going for amputation today.  CM attempted to speak with grand daughter regarding disposition plan post amputation however grand daughter emotional and stated she needed to go with grandmother to surgical area.    04/23/15 Pt being evaluated by vascular surgery for possible amputation.  CM will continue to follow for disposition needs  04/17/15 CM received notice from RN of pt discharge.  Pt has dementia and is taken care of at home by granddaughter, Rachel [name] [phone] or [phone].  CM spoke with Rachel [name] who states she and her husband will come and pick up pt this afternoon.  Rachel [name] chooses [facility] to render HHPT/OT/RN/aide services.  Referral called to [facility] rep, Santiago Glad.  Rachel [name] states no DME needs.  No other CM needs were communicated. Maryclare Labrador, RN 04/26/2015, 2:13 PM

## 2015-04-26 NOTE — Progress Notes (Addendum)
  Progress Note    04/26/2015 7:28 AM 1 Day Post-Op  Subjective:  Sleeping - awakes easily  Tm 100 now afebrile HR 70's-110 NSR [id number] systolic [id number] RA    Filed Vitals:   04/25/15 2159 04/26/15 0626  BP: 130/59 157/97  Pulse: 82 88  Temp: 100 F (37.8 C) 98.4 F (36.9 C)  Resp: 18 16    Physical Exam: Incisions:  Bandage is in tact and dry  CBC    Component Value Date/Time   WBC 11.8* 04/26/2015 0400   RBC 3.80* 04/26/2015 0400   RBC 4.21 04/24/2015 1212   HGB 9.2* 04/26/2015 0400   HCT 28.9* 04/26/2015 0400   PLT 1138* 04/26/2015 0400   MCV 76.1* 04/26/2015 0400   MCH 24.2* 04/26/2015 0400   MCHC 31.8 04/26/2015 0400   RDW 16.7* 04/26/2015 0400   LYMPHSABS 1.4 04/21/2015 1006   MONOABS 1.1* 04/21/2015 1006   EOSABS 0.2 04/21/2015 1006   BASOSABS 0.0 04/21/2015 1006    BMET    Component Value Date/Time   NA 142 04/26/2015 0400   K 4.8 04/26/2015 0400   CL 113* 04/26/2015 0400   CO2 21* 04/26/2015 0400   GLUCOSE 103* 04/26/2015 0400   BUN 18 04/26/2015 0400   CREATININE 1.31* 04/26/2015 0400   CALCIUM 8.5* 04/26/2015 0400   GFRNONAA 36* 04/26/2015 0400   GFRAA 42* 04/26/2015 0400    INR    Component Value Date/Time   INR 1.28 04/21/2015 1006     Intake/Output Summary (Last 24 hours) at 04/26/15 0728 Last data filed at 04/25/15 1900  Gross per 24 hour  Intake    540 ml  Output    150 ml  Net    390 ml     Assessment/Plan:  82 y.o. female is s/p left above knee amputation  1 Day Post-Op  -dressing is dry and in tact -will take down dressing tomorrow -will order retention sock tomorrow for left stump   Leontine Locket, PA-C [facility] [phone] 04/26/2015 7:28 AM   I have examined the patient, reviewed and agree with above.More alert this AM.  Ate 70% breakfast. Denies pain. Dressing intact. Will remove dressing tomorrow  Curt Jews, MD 04/26/2015 10:05 AM

## 2015-04-26 NOTE — Telephone Encounter (Signed)
-----   Message from Mena Goes, RN sent at 04/25/2015  1:23 PM EST ----- Regarding: schedule   ----- Message -----    From: Gabriel Earing, PA-C    Sent: 04/25/2015   1:22 PM      To: Vvs Charge Pool  S/p left BKA 04/25/15.  F/u with Dr. Donnetta Hutching in 4 weeks.  Thanks, Aldona Bar

## 2015-04-27 DIAGNOSIS — Z8669 Personal history of other diseases of the nervous system and sense organs: Secondary | ICD-10-CM

## 2015-04-27 LAB — BASIC METABOLIC PANEL
Anion gap: 10 (ref 5–15)
BUN: 20 mg/dL (ref 6–20)
CHLORIDE: 107 mmol/L (ref 101–111)
CO2: 24 mmol/L (ref 22–32)
CREATININE: 1.38 mg/dL — AB (ref 0.44–1.00)
Calcium: 8.7 mg/dL — ABNORMAL LOW (ref 8.9–10.3)
GFR calc non Af Amer: 34 mL/min — ABNORMAL LOW (ref >60)
GFR, EST AFRICAN AMERICAN: 39 mL/min — AB (ref >60)
Glucose, Bld: 167 mg/dL — ABNORMAL HIGH (ref 65–99)
Potassium: 4.9 mmol/L (ref 3.5–5.1)
SODIUM: 141 mmol/L (ref 135–145)

## 2015-04-27 LAB — CBC
HCT: 31.8 % — ABNORMAL LOW (ref 36.0–46.0)
Hemoglobin: 10.3 g/dL — ABNORMAL LOW (ref 12.0–15.0)
MCH: 24.3 pg — ABNORMAL LOW (ref 26.0–34.0)
MCHC: 32.4 g/dL (ref 30.0–36.0)
MCV: 75 fL — AB (ref 78.0–100.0)
PLATELETS: 1233 10*3/uL — AB (ref 150–400)
RBC: 4.24 MIL/uL (ref 3.87–5.11)
RDW: 17 % — ABNORMAL HIGH (ref 11.5–15.5)
WBC: 13.7 10*3/uL — AB (ref 4.0–10.5)

## 2015-04-27 MED ORDER — CLOPIDOGREL BISULFATE 75 MG PO TABS
75.0000 mg | ORAL_TABLET | Freq: Every day | ORAL | Status: DC
  Administered 2015-04-27 – 2015-04-29 (×3): 75 mg via ORAL
  Filled 2015-04-27 (×3): qty 1

## 2015-04-27 MED ORDER — HYDROCORTISONE 0.5 % EX CREA
TOPICAL_CREAM | Freq: Two times a day (BID) | CUTANEOUS | Status: DC
  Administered 2015-04-28 (×2): via TOPICAL
  Filled 2015-04-27 (×2): qty 28.35

## 2015-04-27 NOTE — Care Management Note (Signed)
Case Management Note Previous CM note initiated by Elenor Quinones RN, CM  Patient Details  Name: Rachel [name] MRN: [id number] Date of Birth: [birth year]  Subjective/Objective:                  LLE swelling  Action/Plan:   Pt has dementia and is taken care of at home by granddaughterJoelene Millin [phone] or [phone].   Discharge planning Expected Discharge Date:               Expected Discharge Plan:  [place]  In-House Referral:     Discharge planning Services  CM Consult  Post Acute Care Choice:  Durable Medical Equipment, Home Health, Resumption of Svcs/PTA Provider Choice offered to:  [facility] POA / Guardian  DME Arranged:  Hospital bed DME Agency:  [place] Arranged:  RN, PT, OT [facility] Agency:  [place]  Status of Service:  Completed, signed off  Medicare Important Message Given:  Yes Date Medicare IM Given:    Medicare IM give by:    Date Additional Medicare IM Given:    Additional Medicare Important Message give by:     If discussed at [place] of Stay Meetings, dates discussed:    Additional Comments:  04/27/15- 1400- left message at home for granddaughter to give CM call regarding d/c plan- at [place] granddaughter arrived to pt's room- spoke with granddaughter- [name] along with MD and per conversation - granddaughter states that she does not want STSNF-plan is for pt to return home with family with [facility]- pt was active with [facility] for RN/PT/OT- will have MD resume [place] orders- granddaughter states they are having a ramp put in- also already have w/c, shower chair, BSC, - state that they were working on getting a hospital bed- MD here will place order for Hospital bed- call made to [facility] with [facility]- regarding hospital bed to f/u for delivery prior to pt's discharge. Granddaughter also reports that she has private pay assistance in the home when needed.  CM to continue to follow for d/c needs  04/27/2015  CM left voicemail for  [name] (grandaughter) to gain clarity on discharge plan.  CM requested bedside nurse to obtain PT evaluation from attending.  04/25/15 Pt going for amputation today.  CM attempted to speak with grand daughter regarding disposition plan post amputation however grand daughter emotional and stated she needed to go with grandmother to surgical area.    04/23/15 Pt being evaluated by vascular surgery for possible amputation.  CM will continue to follow for disposition needs  04/17/15 CM received notice from RN of pt discharge.  Pt has dementia and is taken care of at home by granddaughter, [name] [phone] or [phone].  CM spoke with [name] who states she and her husband will come and pick up pt this afternoon.  [name] chooses [facility] to render HHPT/OT/RN/aide services.  Referral called to [facility] rep, Santiago Glad.  [name] states no DME needs.  No other CM needs were communicated.   [name], RN 04/27/2015, 5:28 PM

## 2015-04-27 NOTE — Evaluation (Signed)
Physical Therapy Evaluation Patient Details Name: Rachel [name] MRN: [id number] DOB: [birth year] Today's Date: 04/27/2015   History of Present Illness  Patient is a 57 y/ female with hx of dementia, HTN, hypothyroidism, CVA and MS who presents with gangrene left foot s/p L AKA.   Clinical Impression  Patient presents with baseline dementia and now s/p L AKA so not able to provide any history/PLOF. [place] daughter not present during session. Will need to see if granddaughter willing to take pt home and then determine any equipment needs or if family not able to provide necessary care, pt may need ST SNF. RN used lift to get patient to chair. No active movement RLE and pt highly sensitive to pain when mobilizing left residual limb. Limited mobility assessment due to complex medical history of MS and hx of dementia. Will follow up to determine safe d/c plan.    Follow Up Recommendations SNF    Equipment Recommendations  Other (comment) (TBD pending disposition)    Recommendations for Other Services OT consult     Precautions / Restrictions Precautions Precautions: Fall Restrictions Weight Bearing Restrictions: Yes LLE Weight Bearing: Non weight bearing      Mobility  Bed Mobility               General bed mobility comments: Sitting in chair upon PT arrival.   Transfers Overall transfer level:  (Transfer not assessed as pt with no active movement in RLE and now with Left AKA)               General transfer comment: Pt got to chair via maxi move by nursing.   Ambulation/Gait Ambulation/Gait assistance:  (Per MD notes, pt non ambulatory. )              Stairs            Wheelchair Mobility    Modified Rankin (Stroke Patients Only)       Balance Overall balance assessment: Needs assistance Sitting-balance support: Feet supported;Bilateral upper extremity supported Sitting balance-Leahy Scale: Poor Sitting balance - Comments: Difficulty sitting  unsupported without assist. Postural control: Posterior lean                                   Pertinent Vitals/Pain Pain Assessment: Faces Faces Pain Scale: Hurts whole lot Pain Location: when mobilizing Left residual limb Pain Descriptors / Indicators: Grimacing;Guarding Pain Intervention(s): Monitored during session;Repositioned;Limited activity within patient's tolerance    Home Living Family/patient expects to be discharged to:: Private residence Living Arrangements: Other relatives (granddaughter) Available Help at Discharge: Family;Available 24 hours/day Type of Home: House Home Access: Stairs to enter   [employer] of Steps: 3 Home Layout: One level Home Equipment: Walker - 4 wheels;Bedside commode;Tub bench;Wheelchair - manual;Hand held shower head Additional Comments: 2 BSC's one by bedside, one over toilet    Prior Function Level of Independence: Needs assistance   Gait / Transfers Assistance Needed: Not sure, per MD notes, pt non ambulatory.      Comments: Pt with hx of dementia and not able to provide PLOF/history at this time and no family members present. Per RN, grand daugter coming to unit today and she will talk with her. All home setup info is from prior admission 1 month ago.     Hand Dominance   Dominant Hand: Right    Extremity/Trunk Assessment   Upper Extremity Assessment: Defer to OT evaluation (Decreased  grip strength bilaterally. Lt>Rt)           Lower Extremity Assessment: RLE deficits/detail;LLE deficits/detail RLE Deficits / Details: Grossly ~2/5 DF, however no active movement throughout rest of LE. Decreased tone.  PROM WFL except limited knee flexion secondary to pain. LLE Deficits / Details: Pt moaning in pain with light touch and during PROM of residual limb.  Cervical / Trunk Assessment: Kyphotic  Communication   Communication: No difficulties  Cognition Arousal/Alertness: Awake/alert Behavior During  Therapy: Flat affect Overall Cognitive Status: No family/caregiver present to determine baseline cognitive functioning (Able to state name and birth month. Otherwise answers, "I don't know") Area of Impairment: Problem solving             Problem Solving: Requires verbal cues General Comments: Able to follow simple 1 step commands, "push, pull"     General Comments General comments (skin integrity, edema, etc.): Grand daughter not present during session to provide PLOF/history.     Exercises        Assessment/Plan    PT Assessment Patient needs continued PT services  PT Diagnosis Altered mental status;Generalized weakness;Acute pain   PT Problem List Pain;Decreased cognition;Decreased mobility;Decreased range of motion;Impaired tone;Decreased safety awareness;Decreased balance;Decreased activity tolerance;Decreased strength  PT Treatment Interventions Therapeutic exercise;Therapeutic activities;Wheelchair mobility training;Functional mobility training;Patient/family education;DME instruction;Balance training   PT Goals (Current goals can be found in the Care Plan section) Acute Rehab PT Goals Patient Stated Goal: pt not able to state PT Goal Formulation: Patient unable to participate in goal setting Time For Goal Achievement: 05/11/15 Potential to Achieve Goals: Fair    Frequency Min 2X/week   Barriers to discharge Inaccessible home environment;Decreased caregiver support Steps to climb to enter home; need to see if family can provide 24/7    Co-evaluation               End of Session   Activity Tolerance: Patient tolerated treatment well Patient left: in chair;with call bell/phone within reach Nurse Communication: Mobility status;Need for lift equipment         Time: 1053-1104 PT Time Calculation (min) (ACUTE ONLY): 11 min   Charges:   PT Evaluation $PT Eval High Complexity: 1 Procedure     PT G Codes:        Luigi Stuckey A [place] 04/27/2015, 1:33  PM Wray Kearns, PT, DPT [phone]

## 2015-04-27 NOTE — Progress Notes (Signed)
CSW received consult for SNF placement- spoke with pt granddaughter at bedside.  Granddaughter has been caretaker at home and feels comfortable taking patient home when medically stable for DC- does not feel as if a SNF will do anything different for the patient than she will be capable of doing at home.  CSW informed RNCM  No further CSW needs at this time- CSW signing off  Domenica Reamer, [place] Social Worker [phone]

## 2015-04-27 NOTE — Care Management Important Message (Signed)
Important Message  Patient Details  Name: Rachel [name] MRN: [id number] Date of Birth: [birth year]   Medicare Important Message Given:  Yes    Nathen May 04/27/2015, 11:38 AM

## 2015-04-27 NOTE — Progress Notes (Signed)
[facility]                                                                              Patient Demographics  Rachel [name], is a 82 y.o. female, DOB - 08-06-1927, [id number]  Admit date - 04/21/2015   Admitting Physician Venetia Maxon Rama, MD  Outpatient Primary MD for the patient is [facility], MD  LOS - 6   Chief Complaint  Patient presents with  . Foot Injury      HPI on 04/21/2015 by Dr. Jacquelynn Cree Makia Mannarino is an 82 y.o. female with a PMH of dementia (wheelchair bound), HTN, ischemic stroke, recent hospitalization 04/13/15-04/17/15 for treatment of cellulitis of the left leg treated with vancomycin and doxycycline, who lives with her daughter and who noticed that her left foot appeared darker than normal last night. She discussed this with a family member who is an Therapist, sports who felt that the patient's circulation was compromised and recommended she massage it. The patient's daughter felt that the color improved with massage and placing a hot pack on the area. This morning, she felt that the dark color was worse, and that the color was not returning with massage, and brought her to the ED for evaluation. The patient's daughter reported pain with massage, but otherwise has not reported pain. Patient is currently without any complaints, although she is fairly lethargic and has a history of dementia. Upon initial evaluation in the ED, the patient has a frankly dusky appearing left foot. The ED physician spoke with Dr. Donnetta Hutching of vascular surgery who requested that the patient be transferred to [facility] for urgent consultation.  Assessment & Plan   Left Ischemic limb S/P AKA 2-01 -[facility] was [facility] for vascular surgery consult -Vascular surgery consultation appreciated, recommended above-the-knee amputation. -Ws on Plavix and IV heparin- discontinue after conversation with Dr. Donnetta Hutching -Plan is for AKA 04/25/15 (Granddaughter  discussed with Dr. Donnetta Hutching) -on cefuroxime for 2 doses.  -PT per vascular.   Encephalopathy; lethargic post op, likely post anesthesia. Resolved. Patient alert, following commands.   Hyperkalemia -Resolved, continue to monitor BMP  Thrombocytosis -Likely acute phase reactant due to limb ischemia -Continue to monitor CBC -has allergy to aspirin.  -will discuss with vascular regarding resuming plavix.   Acute on chronic kidney failure, stage III -Lisinopril held -Likely secondary to poor oral intake -continue to monitor BMP . IV fluids.   Pressure ulcer of the right heel, stage IV -Wound care consulted  Severe protein Malnutrition -Continue dysphagia 3 diet and nutritional supplements when awake  History of multiple sclerosis -Wheelchair bound at baseline -PT and OT consulted  Dementia -Stable?  Hypothyroidism -Continue Synthroid  Microcytic anemia, iron deficiency. Will start oral iron when taking PO.  -Hb 9.1, will continue to monitor CBC -anemia panel, FOBT  Code Status: DO NOT RESUSCITATE  Family Communication: granddaughter at bedside.   Disposition Plan:  Left AKA 04/25/15  Time Spent in minutes   30 minutes  Procedures  None  Consults   Vascular surgery Palliative care  DVT Prophylaxis  heparin  Lab Results  Component Value Date   PLT 1233* 04/27/2015    Medications  Scheduled Meds: . cholecalciferol  5,000 Units Oral Daily  . docusate sodium  100 mg Oral Daily  . feeding supplement (ENSURE ENLIVE)  237 mL Oral BID BM  . heparin subcutaneous  5,000 Units Subcutaneous 3 times per day  . levothyroxine  25 mcg Oral QAC breakfast  . multivitamin with minerals  1 tablet Oral Daily  . pantoprazole  40 mg Oral Daily  . pravastatin  20 mg Oral q1800  . sodium chloride flush  3 mL Intravenous Q12H   Continuous Infusions: . dextrose 5 % and 0.9% NaCl 75 mL/hr at 04/27/15 1331   PRN Meds:.acetaminophen **OR** acetaminophen, alum & mag  hydroxide-simeth, bisacodyl, guaiFENesin-dextromethorphan, hydrALAZINE, HYDROcodone-acetaminophen, labetalol, metoprolol, morphine injection, ondansetron **OR** ondansetron (ZOFRAN) IV, phenol, potassium chloride, senna-docusate  Antibiotics    Anti-infectives    Start     Dose/Rate Route Frequency Ordered Stop   04/25/15 2030  cefUROXime (ZINACEF) 1.5 g in dextrose 5 % 50 mL IVPB     1.5 g 100 mL/hr over 30 Minutes Intravenous Every 12 hours 04/25/15 1416 04/26/15 1224      Subjective:   [name] ,alert, no new complaints,. Had BM   Objective:   Filed Vitals:   04/26/15 0626 04/26/15 1958 04/27/15 0051 04/27/15 0429  BP: 157/97 173/79 125/54 166/81  Pulse: 88 99  110  Temp: 98.4 F (36.9 C) 99.1 F (37.3 C)  99.7 F (37.6 C)  TempSrc: Axillary Oral  Oral  Resp: 16 18  18   Height:      Weight:      SpO2: 98% 100%  100%    Wt Readings from Last 3 Encounters:  04/21/15 45.813 kg (101 lb)  04/14/15 45.9 kg (101 lb 3.1 oz)  02/28/15 45.859 kg (101 lb 1.6 oz)     Intake/Output Summary (Last 24 hours) at 04/27/15 1341 Last data filed at 04/27/15 0800  Gross per 24 hour  Intake    840 ml  Output      0 ml  Net    840 ml    Exam  General: elderly, frail, NAD  HEENT: NCAT, mucous membranes mildly moist  Cardiovascular: S1 S2 auscultated, RRR, soft murmur  Respiratory: Clear to auscultation  Abdomen: Soft, nontender, nondistended, + bowel sounds  Extremities: Left AKA with dressing.      Data Review   Micro Results No results found for this or any previous visit (from the past 240 hour(s)).  Radiology Reports Dg Tibia/fibula Left  04/13/2015  CLINICAL DATA:  Recent fall. Left leg pain and swelling. Initial encounter. EXAM: LEFT TIBIA AND FIBULA - 2 VIEW COMPARISON:  None. FINDINGS: There is no evidence of fracture or other focal bone lesions. Generalized osteopenia noted. Osteoarthritis seen involving lateral compartment knee. Extensive peripheral  vascular calcification noted. Soft tissue swelling is seen along the lateral aspect of the fibular shaft. IMPRESSION: Lateral soft tissue swelling.  No evidence of fracture. Electronically Signed   By: Earle Gell M.D.   On: 04/13/2015 17:30   Dg Ankle Complete Left  04/13/2015  CLINICAL DATA:  Recent fall. Left ankle injury and pain. Initial encounter. EXAM: LEFT ANKLE COMPLETE - 3+ VIEW COMPARISON:  None. FINDINGS: There is no evidence of fracture, dislocation, or joint effusion. There is no evidence of arthropathy or other focal bone abnormality. Well corticated ossific densities are seen along the talar neck, likely due to old avulsion injuries or chronic trauma. Generalized osteopenia noted. Lateral soft tissue swelling also demonstrated. IMPRESSION: Lateral soft  tissue swelling. No evidence of acute fracture or dislocation. Electronically Signed   By: Earle Gell M.D.   On: 04/13/2015 17:32    CBC  Recent Labs Lab 04/21/15 1006  04/23/15 0420 04/24/15 0638 04/25/15 0330 04/26/15 0400 04/27/15 0340  WBC 16.1*  < > 13.2* 13.5* 13.2* 11.8* 13.7*  HGB 11.5*  < > 9.5* 9.1* 9.7* 9.2* 10.3*  HCT 35.4*  < > 29.9* 28.1* 29.9* 28.9* 31.8*  PLT 1158*  < > 1060* 1072* 1172* 1138* 1233*  MCV 76.1*  < > 76.1* 75.9* 75.9* 76.1* 75.0*  MCH 24.7*  < > 24.2* 24.6* 24.6* 24.2* 24.3*  MCHC 32.5  < > 31.8 32.4 32.4 31.8 32.4  RDW 17.2*  < > 16.8* 16.9* 16.9* 16.7* 17.0*  LYMPHSABS 1.4  --   --   --   --   --   --   MONOABS 1.1*  --   --   --   --   --   --   EOSABS 0.2  --   --   --   --   --   --   BASOSABS 0.0  --   --   --   --   --   --   < > = values in this interval not displayed.  Chemistries   Recent Labs Lab 04/21/15 1106  04/23/15 0420 04/24/15 0638 04/25/15 0330 04/26/15 0400 04/27/15 0340  NA 143  < > 142 139 143 142 141  K 4.0  < > 4.0 3.9 4.2 4.8 4.9  CL 106  < > 107 110 111 113* 107  CO2 26  < > 24 21* 24 21* 24  GLUCOSE 117*  < > 101* 110* 104* 103* 167*  BUN 54*  < >  35* 25* 21* 18 20  CREATININE 1.82*  < > 1.28* 1.20* 1.20* 1.31* 1.38*  CALCIUM 9.4  < > 8.8* 8.4* 8.7* 8.5* 8.7*  AST 68*  --   --   --   --   --   --   ALT 35  --   --   --   --   --   --   ALKPHOS 72  --   --   --   --   --   --   BILITOT 0.5  --   --   --   --   --   --   < > = values in this interval not displayed. ------------------------------------------------------------------------------------------------------------------ estimated creatinine clearance is 21.5 mL/min (by C-G formula based on Cr of 1.38). ------------------------------------------------------------------------------------------------------------------ No results for input(s): HGBA1C in the last 72 hours. ------------------------------------------------------------------------------------------------------------------ No results for input(s): CHOL, HDL, LDLCALC, TRIG, CHOLHDL, LDLDIRECT in the last 72 hours. ------------------------------------------------------------------------------------------------------------------ No results for input(s): TSH, T4TOTAL, T3FREE, THYROIDAB in the last 72 hours.  Invalid input(s): FREET3 ------------------------------------------------------------------------------------------------------------------ No results for input(s): VITAMINB12, FOLATE, FERRITIN, TIBC, IRON, RETICCTPCT in the last 72 hours.  Coagulation profile  Recent Labs Lab 04/21/15 1006  INR 1.28    No results for input(s): DDIMER in the last 72 hours.  Cardiac Enzymes No results for input(s): CKMB, TROPONINI, MYOGLOBIN in the last 168 hours.  Invalid input(s): CK ------------------------------------------------------------------------------------------------------------------ Invalid input(s): POCBNP    Regalado, Belkys A Md on 04/27/2015 at 1:41 PM  Between 7am to 7pm - Pager - [phone]  After 7pm go to [email or web address] - password [email or web address]  And look for the night coverage person covering for me  after hours  [facility]  Group Office  [phone]

## 2015-04-27 NOTE — Progress Notes (Addendum)
  [facility] Progress Note  04/27/2015 7:49 AM POD 2  Subjective:  Denies pain.   Filed Vitals:   04/27/15 0051 04/27/15 0429  BP: 125/54 166/81  Pulse:  110  Temp:  99.7 F (37.6 C)  Resp:  18    Physical Exam: Left AKA incision clean, dry and intact. No swelling of left stump.   CBC    Component Value Date/Time   WBC 13.7* 04/27/2015 0340   RBC 4.24 04/27/2015 0340   RBC 4.21 04/24/2015 1212   HGB 10.3* 04/27/2015 0340   HCT 31.8* 04/27/2015 0340   PLT 1233* 04/27/2015 0340   MCV 75.0* 04/27/2015 0340   MCH 24.3* 04/27/2015 0340   MCHC 32.4 04/27/2015 0340   RDW 17.0* 04/27/2015 0340   LYMPHSABS 1.4 04/21/2015 1006   MONOABS 1.1* 04/21/2015 1006   EOSABS 0.2 04/21/2015 1006   BASOSABS 0.0 04/21/2015 1006    BMET    Component Value Date/Time   NA 141 04/27/2015 0340   K 4.9 04/27/2015 0340   CL 107 04/27/2015 0340   CO2 24 04/27/2015 0340   GLUCOSE 167* 04/27/2015 0340   BUN 20 04/27/2015 0340   CREATININE 1.38* 04/27/2015 0340   CALCIUM 8.7* 04/27/2015 0340   GFRNONAA 34* 04/27/2015 0340   GFRAA 39* 04/27/2015 0340    INR    Component Value Date/Time   INR 1.28 04/21/2015 1006     Intake/Output Summary (Last 24 hours) at 04/27/15 0749 Last data filed at 04/26/15 1801  Gross per 24 hour  Intake   1080 ml  Output      0 ml  Net   1080 ml     Assessment/Plan:  82 y.o. female is s/p left above knee amputation  POD 2  -Stump is viable.  -Stable from vascular standpoint. -F/u with Dr. Donnetta Hutching in 4 weeks.    Virgina Jock, PA-C [facility] Office: [phone] Pager: [phone] 04/27/2015 7:49 AM  I have examined the patient, reviewed and agree with above.Comfortable, denies pain.  OK for dc to home or SNF from vasc standpoint.  Will see again Monday.  Please call over weekend if we can help  Curt Jews, MD 04/27/2015 8:48 AM

## 2015-04-28 ENCOUNTER — Inpatient Hospital Stay (HOSPITAL_COMMUNITY): Payer: Medicare HMO

## 2015-04-28 LAB — GLUCOSE, CAPILLARY
GLUCOSE-CAPILLARY: 102 mg/dL — AB (ref 65–99)
GLUCOSE-CAPILLARY: 136 mg/dL — AB (ref 65–99)
Glucose-Capillary: 97 mg/dL (ref 65–99)
Glucose-Capillary: 92 mg/dL (ref 65–99)

## 2015-04-28 LAB — BASIC METABOLIC PANEL
Anion gap: 8 (ref 5–15)
BUN: 18 mg/dL (ref 6–20)
CHLORIDE: 109 mmol/L (ref 101–111)
CO2: 25 mmol/L (ref 22–32)
CREATININE: 1.36 mg/dL — AB (ref 0.44–1.00)
Calcium: 8.6 mg/dL — ABNORMAL LOW (ref 8.9–10.3)
GFR, EST AFRICAN AMERICAN: 40 mL/min — AB (ref >60)
GFR, EST NON AFRICAN AMERICAN: 34 mL/min — AB (ref >60)
Glucose, Bld: 118 mg/dL — ABNORMAL HIGH (ref 65–99)
Potassium: 4.3 mmol/L (ref 3.5–5.1)
SODIUM: 142 mmol/L (ref 135–145)

## 2015-04-28 LAB — CBC
HCT: 29.4 % — ABNORMAL LOW (ref 36.0–46.0)
HEMOGLOBIN: 9.5 g/dL — AB (ref 12.0–15.0)
MCH: 23.8 pg — ABNORMAL LOW (ref 26.0–34.0)
MCHC: 32.3 g/dL (ref 30.0–36.0)
MCV: 73.5 fL — ABNORMAL LOW (ref 78.0–100.0)
PLATELETS: 1144 10*3/uL — AB (ref 150–400)
RBC: 4 MIL/uL (ref 3.87–5.11)
RDW: 17.9 % — ABNORMAL HIGH (ref 11.5–15.5)
WBC: 14.7 10*3/uL — ABNORMAL HIGH (ref 4.0–10.5)

## 2015-04-28 MED ORDER — POLYSACCHARIDE IRON COMPLEX 150 MG PO CAPS
150.0000 mg | ORAL_CAPSULE | Freq: Every day | ORAL | Status: DC
  Administered 2015-04-28: 150 mg via ORAL
  Filled 2015-04-28 (×4): qty 1

## 2015-04-28 MED ORDER — DOXYCYCLINE HYCLATE 100 MG PO TABS
100.0000 mg | ORAL_TABLET | Freq: Two times a day (BID) | ORAL | Status: DC
  Administered 2015-04-28 – 2015-04-29 (×3): 100 mg via ORAL
  Filled 2015-04-28 (×3): qty 1

## 2015-04-28 NOTE — Progress Notes (Signed)
[facility]                                                                              Patient Demographics  Rachel [name], is a 82 y.o. female, DOB - Apr 22, 1927, OX:[id number]  Admit date - 04/21/2015   Admitting Physician Venetia Maxon Rama, MD  Outpatient Primary MD for the patient is [facility], MD  LOS - 7   Chief Complaint  Patient presents with  . Foot Injury      HPI on 04/21/2015 by Dr. Jacquelynn Cree Rachel [name] is an 82 y.o. female with a PMH of dementia (wheelchair bound), HTN, ischemic stroke, recent hospitalization 04/13/15-04/17/15 for treatment of cellulitis of the left leg treated with vancomycin and doxycycline, who lives with her daughter and who noticed that her left foot appeared darker than normal last night. She discussed this with a family member who is an Therapist, sports who felt that the patient's circulation was compromised and recommended she massage it. The patient's daughter felt that the color improved with massage and placing a hot pack on the area. This morning, she felt that the dark color was worse, and that the color was not returning with massage, and brought her to the ED for evaluation. The patient's daughter reported pain with massage, but otherwise has not reported pain. Patient is currently without any complaints, although she is fairly lethargic and has a history of dementia. Upon initial evaluation in the ED, the patient has a frankly dusky appearing left foot. The ED physician spoke with Dr. Donnetta Hutching of vascular surgery who requested that the patient be transferred to [facility] for urgent consultation.  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 -Ws on Plavix and IV heparin- discontinue after conversation with Dr. Donnetta Hutching -Plan is for AKA 04/25/15 (Granddaughter  discussed with Dr. Donnetta Hutching) -on cefuroxime for 2 doses.  -PT per vascular.   Encephalopathy; lethargic post op, likely post anesthesia. Resolved. Patient alert, following commands.   Leukocytosis;  Persist. Start doxy to cover for cellulitis due to rash right arm.  Chest x ray 2-04 negative.  Check UA.  Repeat CBC in am./   Hyperkalemia -Resolved, continue to monitor BMP  Thrombocytosis -Likely acute phase reactant due to limb ischemia -Continue to monitor CBC -has allergy to aspirin.  -on plavix.   Acute on chronic kidney failure, stage III -Lisinopril held -Likely secondary to poor oral intake -continue to monitor BMP . IV fluids.  Stable.   Pressure ulcer of the right heel, stage IV -Wound care was consulted. Local care  Severe protein Malnutrition -Continue dysphagia 3 diet and nutritional supplements when awake  History of multiple sclerosis -Wheelchair bound at baseline -PT and OT consulted  Dementia -Stable?  Hypothyroidism -Continue Synthroid  Microcytic anemia, iron deficiency. Will start oral iron when taking PO.  -Hb 9.1, will continue to monitor CBC -anemia panel, FOBT  Code Status: DO NOT RESUSCITATE  Family Communication: granddaughter at bedside 2-3  Disposition Plan:  Left AKA 04/25/15  Time Spent in minutes   30 minutes  Procedures  None  Consults   Vascular surgery Palliative care  DVT Prophylaxis  heparin  Lab Results  Component Value Date   PLT 1144* 04/28/2015    Medications  Scheduled Meds: . cholecalciferol  5,000 Units Oral Daily  . clopidogrel  75 mg Oral Daily  . docusate sodium  100 mg Oral Daily  . doxycycline  100 mg Oral Q12H  . feeding supplement (ENSURE ENLIVE)  237 mL Oral BID BM  . heparin subcutaneous  5,000 Units Subcutaneous 3 times per day  . hydrocortisone cream   Topical BID  . levothyroxine  25 mcg Oral QAC breakfast  . multivitamin with minerals  1 tablet Oral Daily  . pantoprazole  40 mg Oral Daily   . pravastatin  20 mg Oral q1800  . sodium chloride flush  3 mL Intravenous Q12H   Continuous Infusions: . dextrose 5 % and 0.9% NaCl 75 mL/hr at 04/28/15 0332   PRN Meds:.acetaminophen **OR** acetaminophen, alum & mag hydroxide-simeth, bisacodyl, guaiFENesin-dextromethorphan, hydrALAZINE, HYDROcodone-acetaminophen, labetalol, metoprolol, morphine injection, ondansetron **OR** ondansetron (ZOFRAN) IV, phenol, potassium chloride, senna-docusate  Antibiotics    Anti-infectives    Start     Dose/Rate Route Frequency Ordered Stop   04/28/15 1000  doxycycline (VIBRA-TABS) tablet 100 mg     100 mg Oral Every 12 hours 04/28/15 0926     04/25/15 2030  cefUROXime (ZINACEF) 1.5 g in dextrose 5 % 50 mL IVPB     1.5 g 100 mL/hr over 30 Minutes Intravenous Every 12 hours 04/25/15 1416 04/26/15 1224      Subjective:   Rachel [name] ,alert, no new complaints,.   Objective:   Filed Vitals:   04/27/15 0429 04/27/15 1330 04/27/15 2019 04/28/15 0435  BP: 166/81 136/71 120/79 141/67  Pulse: 110 105 100 75  Temp: 99.7 F (37.6 C) 99.3 F (37.4 C) 98.5 F (36.9 C) 98.6 F (37 C)  TempSrc: Oral Oral Oral Oral  Resp: 18 17 18 18   Height:      Weight:      SpO2: 100% 96% 98% 99%    Wt Readings from Last 3 Encounters:  04/21/15 45.813 kg (101 lb)  04/14/15 45.9 kg (101 lb 3.1 oz)  02/28/15 45.859 kg (101 lb 1.6 oz)     Intake/Output Summary (Last 24 hours) at 04/28/15 0930 Last data filed at 04/28/15 0848  Gross per 24 hour  Intake 1502.92 ml  Output      0 ml  Net 1502.92 ml    Exam  General: elderly, frail, NAD  HEENT: NCAT, mucous membranes mildly moist  Cardiovascular: S1 S2 auscultated, RRR, soft murmur  Respiratory: Clear to auscultation  Abdomen: Soft, nontender, nondistended, + bowel sounds  Extremities: Left AKA with dressing.      Data Review   Micro Results No results found for this or any previous visit (from the past 240 hour(s)).  Radiology  Reports Dg Tibia/fibula Left  04/13/2015  CLINICAL DATA:  Recent fall. Left leg pain and swelling. Initial encounter. EXAM: LEFT TIBIA AND FIBULA - 2 VIEW COMPARISON:  None. FINDINGS: There is no evidence of fracture or other focal bone lesions. Generalized osteopenia noted. Osteoarthritis seen involving lateral compartment knee. Extensive peripheral vascular calcification noted. Soft tissue swelling is seen along the lateral aspect of the fibular shaft. IMPRESSION: Lateral soft tissue swelling.  No evidence of fracture. Electronically Signed   By: Earle Gell M.D.   On: 04/13/2015 17:30   Dg Ankle Complete Left  04/13/2015  CLINICAL  DATA:  Recent fall. Left ankle injury and pain. Initial encounter. EXAM: LEFT ANKLE COMPLETE - 3+ VIEW COMPARISON:  None. FINDINGS: There is no evidence of fracture, dislocation, or joint effusion. There is no evidence of arthropathy or other focal bone abnormality. Well corticated ossific densities are seen along the talar neck, likely due to old avulsion injuries or chronic trauma. Generalized osteopenia noted. Lateral soft tissue swelling also demonstrated. IMPRESSION: Lateral soft tissue swelling. No evidence of acute fracture or dislocation. Electronically Signed   By: Earle Gell M.D.   On: 04/13/2015 17:32    CBC  Recent Labs Lab 04/21/15 1006  04/24/15 0638 04/25/15 0330 04/26/15 0400 04/27/15 0340 04/28/15 0246  WBC 16.1*  < > 13.5* 13.2* 11.8* 13.7* 14.7*  HGB 11.5*  < > 9.1* 9.7* 9.2* 10.3* 9.5*  HCT 35.4*  < > 28.1* 29.9* 28.9* 31.8* 29.4*  PLT 1158*  < > 1072* 1172* 1138* 1233* 1144*  MCV 76.1*  < > 75.9* 75.9* 76.1* 75.0* 73.5*  MCH 24.7*  < > 24.6* 24.6* 24.2* 24.3* 23.8*  MCHC 32.5  < > 32.4 32.4 31.8 32.4 32.3  RDW 17.2*  < > 16.9* 16.9* 16.7* 17.0* 17.9*  LYMPHSABS 1.4  --   --   --   --   --   --   MONOABS 1.1*  --   --   --   --   --   --   EOSABS 0.2  --   --   --   --   --   --   BASOSABS 0.0  --   --   --   --   --   --   < > = values  in this interval not displayed.  Chemistries   Recent Labs Lab 04/21/15 1106  04/24/15 0638 04/25/15 0330 04/26/15 0400 04/27/15 0340 04/28/15 0246  NA 143  < > 139 143 142 141 142  K 4.0  < > 3.9 4.2 4.8 4.9 4.3  CL 106  < > 110 111 113* 107 109  CO2 26  < > 21* 24 21* 24 25  GLUCOSE 117*  < > 110* 104* 103* 167* 118*  BUN 54*  < > 25* 21* 18 20 18   CREATININE 1.82*  < > 1.20* 1.20* 1.31* 1.38* 1.36*  CALCIUM 9.4  < > 8.4* 8.7* 8.5* 8.7* 8.6*  AST 68*  --   --   --   --   --   --   ALT 35  --   --   --   --   --   --   ALKPHOS 72  --   --   --   --   --   --   BILITOT 0.5  --   --   --   --   --   --   < > = values in this interval not displayed. ------------------------------------------------------------------------------------------------------------------ estimated creatinine clearance is 21.9 mL/min (by C-G formula based on Cr of 1.36). ------------------------------------------------------------------------------------------------------------------ No results for input(s): HGBA1C in the last 72 hours. ------------------------------------------------------------------------------------------------------------------ No results for input(s): CHOL, HDL, LDLCALC, TRIG, CHOLHDL, LDLDIRECT in the last 72 hours. ------------------------------------------------------------------------------------------------------------------ No results for input(s): TSH, T4TOTAL, T3FREE, THYROIDAB in the last 72 hours.  Invalid input(s): FREET3 ------------------------------------------------------------------------------------------------------------------ No results for input(s): VITAMINB12, FOLATE, FERRITIN, TIBC, IRON, RETICCTPCT in the last 72 hours.  Coagulation profile  Recent Labs Lab 04/21/15 1006  INR 1.28    No results for input(s): DDIMER in  the last 72 hours.  Cardiac Enzymes No results for input(s): CKMB, TROPONINI, MYOGLOBIN in the last 168 hours.  Invalid input(s):  CK ------------------------------------------------------------------------------------------------------------------ Invalid input(s): POCBNP    Jameil Whitmoyer A Md on 04/28/2015 at 9:30 AM  Between 7am to 7pm - Pager - [phone]  After 7pm go to [email or web address] - password [email or web address]  And look for the night coverage person covering for me after hours  [facility] Office  [phone]

## 2015-04-28 NOTE — Progress Notes (Signed)
Called the granddaughter - [name] - informed her patient will not d/c today. Dr. Tyrell Antonio has ordered tests r/t elevated WBC count, may d/c home tomorrow.

## 2015-04-29 LAB — URINALYSIS, ROUTINE W REFLEX MICROSCOPIC
BILIRUBIN URINE: NEGATIVE
Glucose, UA: NEGATIVE mg/dL
Ketones, ur: NEGATIVE mg/dL
Leukocytes, UA: NEGATIVE
NITRITE: NEGATIVE
PH: 6 (ref 5.0–8.0)
Protein, ur: NEGATIVE mg/dL
SPECIFIC GRAVITY, URINE: 1.007 (ref 1.005–1.030)

## 2015-04-29 LAB — CBC
HCT: 28 % — ABNORMAL LOW (ref 36.0–46.0)
Hemoglobin: 8.9 g/dL — ABNORMAL LOW (ref 12.0–15.0)
MCH: 23.6 pg — AB (ref 26.0–34.0)
MCHC: 31.8 g/dL (ref 30.0–36.0)
MCV: 74.3 fL — AB (ref 78.0–100.0)
PLATELETS: 1151 10*3/uL — AB (ref 150–400)
RBC: 3.77 MIL/uL — AB (ref 3.87–5.11)
RDW: 18.1 % — ABNORMAL HIGH (ref 11.5–15.5)
WBC: 13 10*3/uL — ABNORMAL HIGH (ref 4.0–10.5)

## 2015-04-29 LAB — URINE MICROSCOPIC-ADD ON: WBC, UA: NONE SEEN WBC/hpf (ref 0–5)

## 2015-04-29 LAB — GLUCOSE, CAPILLARY
GLUCOSE-CAPILLARY: 88 mg/dL (ref 65–99)
GLUCOSE-CAPILLARY: 117 mg/dL — AB (ref 65–99)

## 2015-04-29 MED ORDER — POLYSACCHARIDE IRON COMPLEX 150 MG PO CAPS: 150.0000 mg | ORAL_CAPSULE | Freq: Every day | ORAL | Status: DC

## 2015-04-29 MED ORDER — ENSURE ENLIVE PO LIQD: 237.0000 mL | Freq: Two times a day (BID) | ORAL | Status: AC

## 2015-04-29 MED ORDER — DOXYCYCLINE HYCLATE 100 MG PO TABS: 100.0000 mg | ORAL_TABLET | Freq: Two times a day (BID) | ORAL | Status: DC

## 2015-04-29 MED ORDER — PANTOPRAZOLE SODIUM 40 MG PO TBEC: 40.0000 mg | DELAYED_RELEASE_TABLET | Freq: Every day | ORAL | Status: DC

## 2015-04-29 NOTE — Progress Notes (Signed)
CM received call from RN to please arrange transport home.  CM called CSW to please arrange for non-emergency transport home.  No other CM needs were communicated.

## 2015-04-29 NOTE — Discharge Summary (Addendum)
Physician Discharge Summary  Rachel [name] [id number] DOB: May 26, 1927 DOA: 04/21/2015  PCP: Rachell Cipro, MD  Admit date: 04/21/2015 Discharge date: 04/29/2015  Time spent: 35 minutes  Recommendations for Outpatient Follow-up:  Needs cbc to follow platelet and wbc.  Needs referral to hematologist Needs to follow up with vascular post op  Discharge Diagnoses:    Ischemic foot   History of multiple sclerosis   Dementia   Hypothyroidism   Acute on chronic kidney failure, stage II - III (HCC)   Thrombocytosis (HCC)   Pressure ulcer of right heel, stage 4 (HCC)   Severe protein-calorie malnutrition ([place])   Hyperkalemia   Discharge Condition: Stable  Diet recommendation: heart healthy diet   Filed Weights   04/21/15 0947  Weight: 45.813 kg (101 lb)    History of present illness:  HPI on 04/21/2015 by Dr. Jacquelynn Cree Cyra Craig-Rudd is an 82 y.o. female with a PMH of dementia (wheelchair bound), HTN, ischemic stroke, recent hospitalization 04/13/15-04/17/15 for treatment of cellulitis of the left leg treated with vancomycin and doxycycline, who lives with her daughter and who noticed that her left foot appeared darker than normal last night. She discussed this with a family member who is an Therapist, sports who felt that the patient's circulation was compromised and recommended she massage it. The patient's daughter felt that the color improved with massage and placing a hot pack on the area. This morning, she felt that the dark color was worse, and that the color was not returning with massage, and brought her to the ED for evaluation. The patient's daughter reported pain with massage, but otherwise has not reported pain. Patient is currently without any complaints, although she is fairly lethargic and has a history of dementia. Upon initial evaluation in the ED, the patient has a frankly dusky appearing left foot. The ED physician spoke with Dr. Donnetta Hutching of vascular surgery who requested that  the patient be transferred to [facility] for urgent consultation.  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 -Ws on Plavix and IV heparin- discontinue after conversation with Dr. Donnetta Hutching -Plan is for AKA 04/25/15 (Granddaughter discussed with Dr. Donnetta Hutching) -on cefuroxime for 2 doses.  -PT per vascular.  Follow up with vascular post op.   Encephalopathy; lethargic post op, likely post anesthesia. Resolved. Patient alert, following commands. Resolved.   Leukocytosis;  Persist. Start doxy to cover for cellulitis due to rash right arm. discharge 5 days course,  Chest x ray 2-04 negative.  UA negative Repeat CBC in am./   Hyperkalemia -Resolved, continue to monitor BMP  Thrombocytosis -Likely acute phase reactant due to limb ischemia, component of essential thrombocytosis.  -Continue to monitor CBC -has allergy to aspirin.  -on plavix.  Needs referral to hematologist   Acute on chronic kidney failure, stage III -Lisinopril held -Likely secondary to poor oral intake -continue to monitor BMP . IV fluids.  Stable.   Deep tissue injury right heel;  -Wound care was consulted. Local care  Severe protein Malnutrition -Continue dysphagia 3 diet and nutritional supplements when awake  History of multiple sclerosis -Wheelchair bound at baseline -PT and OT consulted  Dementia -Stable?  Hypothyroidism -Continue Synthroid  Microcytic anemia, iron deficiency. On nu iron  -Hb 9.1, will continue to monitor CBC -anemia panel, FOBT       Procedures:  Consultations:  vascular  Discharge Exam: Filed Vitals:   04/28/15 2023 04/29/15 0657  BP: 133/62 152/57  Pulse: 92 65  Temp: 100 F (37.8 C) 98.6 F (37 C)  Resp: 18 18    General: NAD Cardiovascular: S 1, S 2  RRR Respiratory:CTA  Discharge Instructions   Discharge Instructions    Diet - low sodium heart healthy    Complete by:  As directed      Increase activity slowly    Complete by:  As directed           Current Discharge Medication List    START taking these medications   Details  feeding supplement, ENSURE ENLIVE, (ENSURE ENLIVE) LIQD Take 237 mLs by mouth 2 (two) times daily between meals. Qty: 237 mL, Refills: 12    iron polysaccharides (NIFEREX) 150 MG capsule Take 1 capsule (150 mg total) by mouth daily. Qty: 30 capsule, Refills: 0    pantoprazole (PROTONIX) 40 MG tablet Take 1 tablet (40 mg total) by mouth daily. Qty: 30 tablet, Refills: 0      CONTINUE these medications which have CHANGED   Details  doxycycline (VIBRA-TABS) 100 MG tablet Take 1 tablet (100 mg total) by mouth every 12 (twelve) hours. Qty: 10 tablet, Refills: 0      CONTINUE these medications which have NOT CHANGED   Details  bisacodyl (DULCOLAX) 5 MG EC tablet Take 1 tablet (5 mg total) by mouth daily as needed for moderate constipation. Qty: 30 tablet, Refills: 0    Cholecalciferol (VITAMIN D3) 5000 UNITS CAPS Take 5,000 Units by mouth daily.    clopidogrel (PLAVIX) 75 MG tablet Take 1 tablet (75 mg total) by mouth daily. Qty: 30 tablet, Refills: 0    HYDROcodone-acetaminophen (NORCO/VICODIN) 5-325 MG tablet Take 1-2 tablets by mouth every 4 (four) hours as needed for moderate pain. Qty: 30 tablet, Refills: 0    Multiple Vitamin (MULTIVITAMIN WITH MINERALS) TABS tablet Take 1 tablet by mouth daily.    pravastatin (PRAVACHOL) 20 MG tablet Take 1 tablet (20 mg total) by mouth daily at 6 PM. Qty: 30 tablet, Refills: 3    senna-docusate (SENOKOT-S) 8.6-50 MG tablet Take 1 tablet by mouth at bedtime as needed for mild constipation. Qty: 30 tablet, Refills: 0    Vitamins A & D (VITAMIN A & D) ointment Apply 1 application topically as needed (with every adult brief change.).    levothyroxine  (SYNTHROID, LEVOTHROID) 25 MCG tablet Take 25 mcg by mouth daily before breakfast.      STOP taking these medications     lisinopril (PRINIVIL,ZESTRIL) 20 MG tablet        Allergies  Allergen Reactions  . Aspirin     Unknown reaction.    Follow-up Information    Follow up with Curt Jews, MD In 4 weeks.   Specialties:  Vascular Surgery, Cardiology   Why:  Office will call you to arrange your appt (sent)   Contact information:   [place] [address] [phone]       Follow up with [facility].   Why:  Resumption of HH-RN/PT/OT/aide   Contact information:   [address] [place] [address] [phone]       Follow up with [facility].   Why:  hospital bed ordered and arranged for home   Contact information:   [address] [place] [address] [phone]       Follow  up with [facility], MD In 1 week.   Specialty:  Family Medicine   Contact information:   [address] [place] [address] [phone]        The results of significant diagnostics from this hospitalization (including imaging, microbiology, ancillary and laboratory) are listed below for reference.    Significant Diagnostic Studies: Dg Chest 2 View  04/28/2015  CLINICAL DATA:  Leukocytosis. EXAM: CHEST  2 VIEW COMPARISON:  None. FINDINGS: The heart size and mediastinal contours are within normal limits. Both lungs are clear. No pneumothorax or pleural effusion is noted. Moderate dextroscoliosis of thoracic spine is noted. IMPRESSION: No active cardiopulmonary disease. Electronically Signed   By: Marijo Conception, M.D.   On: 04/28/2015 13:04   Dg Tibia/fibula Left  04/13/2015  CLINICAL DATA:  Recent fall. Left leg pain and swelling. Initial encounter. EXAM: LEFT TIBIA AND FIBULA - 2 VIEW COMPARISON:  None. FINDINGS: There is no evidence of fracture or other focal bone lesions. Generalized osteopenia noted.  Osteoarthritis seen involving lateral compartment knee. Extensive peripheral vascular calcification noted. Soft tissue swelling is seen along the lateral aspect of the fibular shaft. IMPRESSION: Lateral soft tissue swelling.  No evidence of fracture. Electronically Signed   By: Earle Gell M.D.   On: 04/13/2015 17:30   Dg Ankle Complete Left  04/13/2015  CLINICAL DATA:  Recent fall. Left ankle injury and pain. Initial encounter. EXAM: LEFT ANKLE COMPLETE - 3+ VIEW COMPARISON:  None. FINDINGS: There is no evidence of fracture, dislocation, or joint effusion. There is no evidence of arthropathy or other focal bone abnormality. Well corticated ossific densities are seen along the talar neck, likely due to old avulsion injuries or chronic trauma. Generalized osteopenia noted. Lateral soft tissue swelling also demonstrated. IMPRESSION: Lateral soft tissue swelling. No evidence of acute fracture or dislocation. Electronically Signed   By: Earle Gell M.D.   On: 04/13/2015 17:32    Microbiology: No results found for this or any previous visit (from the past 240 hour(s)).   Labs: Basic Metabolic Panel:  Recent Labs Lab 04/24/15 0638 04/25/15 0330 04/26/15 0400 04/27/15 0340 04/28/15 0246  NA 139 143 142 141 142  K 3.9 4.2 4.8 4.9 4.3  CL 110 111 113* 107 109  CO2 21* 24 21* 24 25  GLUCOSE 110* 104* 103* 167* 118*  BUN 25* 21* 18 20 18   CREATININE 1.20* 1.20* 1.31* 1.38* 1.36*  CALCIUM 8.4* 8.7* 8.5* 8.7* 8.6*   Liver Function Tests: No results for input(s): AST, ALT, ALKPHOS, BILITOT, PROT, ALBUMIN in the last 168 hours. No results for input(s): LIPASE, AMYLASE in the last 168 hours. No results for input(s): AMMONIA in the last 168 hours. CBC:  Recent Labs Lab 04/25/15 0330 04/26/15 0400 04/27/15 0340 04/28/15 0246 04/29/15 0243  WBC 13.2* 11.8* 13.7* 14.7* 13.0*  HGB 9.7* 9.2* 10.3* 9.5* 8.9*  HCT 29.9* 28.9* 31.8* 29.4* 28.0*  MCV 75.9* 76.1* 75.0* 73.5* 74.3*  PLT 1172* 1138*  1233* 1144* 1151*   Cardiac Enzymes: No results for input(s): CKTOTAL, CKMB, CKMBINDEX, TROPONINI in the last 168 hours. BNP: BNP (last 3 results) No results for input(s): BNP in the last 8760 hours.  ProBNP (last 3 results) No results for input(s): PROBNP in the last 8760 hours.  CBG:  Recent Labs Lab 04/28/15 0608 04/28/15 1147 04/28/15 1707 04/28/15 2117 04/29/15 0652  GLUCAP 102* 97 92 136* 88       Signed:  Niel Hummer A MD.  [facility] 04/29/2015, 10:53 AM

## 2015-04-29 NOTE — Progress Notes (Signed)
Reviewed AVS printout including d/c instructions, medication schedule, appointments and Rx's with patient's granddaughter - all questions answered. [facility] here to transport to granddaughter's house.

## 2015-04-29 NOTE — Progress Notes (Signed)
Non emergent ambulance transport arranged on behalf of pt.

## 2015-04-30 DIAGNOSIS — G35 Multiple sclerosis: Secondary | ICD-10-CM | POA: Diagnosis not present

## 2015-04-30 DIAGNOSIS — I69354 Hemiplegia and hemiparesis following cerebral infarction affecting left non-dominant side: Secondary | ICD-10-CM | POA: Diagnosis not present

## 2015-04-30 DIAGNOSIS — I6789 Other cerebrovascular disease: Secondary | ICD-10-CM | POA: Diagnosis not present

## 2015-05-03 NOTE — Progress Notes (Addendum)
Post discharge- note- pt's granddaughter Rachel [name]- called the 2W unit this am- to ask about [facility] services- states that services have not been resumed and is trying to find out what is going on- she told unit that she had called [facility] and they told her that they do not have orders- have called and spoken with Manuela Schwartz - hospital liaison for [facility]- who is looking into the situation- [facility] orders were placed by MD on 04/27/15 prior to discharge and [facility] had notice of resumption-per Manuela Schwartz- [facility] to call granddaughter today and resolve situation for [facility] services to resume.  1345- update- spoke with pt's granddaughter Rachel [name] via TC- she states that she has heard from [facility] and situation is being resolved to resume [facility] services- she did express concern however that [facility] had told her that they thought pt was going to SNF- she expressed feeling like people were trying to "make her send her grandmother somewhere she didn't want too" she felt this was how the confusion happened- no sure how [facility] got that information regarding SNF- and assured granddaughter that no one felt that she was unable to care for pt at home and that MD had ordered the [facility] resumption after conversation with her on 04/27/15. Granddaughter expressed appreciation for CM assistance in resolving the [facility] situation.

## 2015-05-04 DIAGNOSIS — E43 Unspecified severe protein-calorie malnutrition: Secondary | ICD-10-CM | POA: Diagnosis not present

## 2015-05-04 DIAGNOSIS — E039 Hypothyroidism, unspecified: Secondary | ICD-10-CM | POA: Diagnosis not present

## 2015-05-04 DIAGNOSIS — L89614 Pressure ulcer of right heel, stage 4: Secondary | ICD-10-CM | POA: Diagnosis not present

## 2015-05-04 DIAGNOSIS — R69 Illness, unspecified: Secondary | ICD-10-CM | POA: Diagnosis not present

## 2015-05-04 DIAGNOSIS — G35 Multiple sclerosis: Secondary | ICD-10-CM | POA: Diagnosis not present

## 2015-05-04 DIAGNOSIS — N183 Chronic kidney disease, stage 3 (moderate): Secondary | ICD-10-CM | POA: Diagnosis not present

## 2015-05-04 DIAGNOSIS — Z4781 Encounter for orthopedic aftercare following surgical amputation: Secondary | ICD-10-CM | POA: Diagnosis not present

## 2015-05-04 DIAGNOSIS — D509 Iron deficiency anemia, unspecified: Secondary | ICD-10-CM | POA: Diagnosis not present

## 2015-05-04 DIAGNOSIS — I129 Hypertensive chronic kidney disease with stage 1 through stage 4 chronic kidney disease, or unspecified chronic kidney disease: Secondary | ICD-10-CM | POA: Diagnosis not present

## 2015-05-04 DIAGNOSIS — Z89612 Acquired absence of left leg above knee: Secondary | ICD-10-CM | POA: Diagnosis not present

## 2015-05-06 DIAGNOSIS — N183 Chronic kidney disease, stage 3 (moderate): Secondary | ICD-10-CM | POA: Diagnosis not present

## 2015-05-06 DIAGNOSIS — D509 Iron deficiency anemia, unspecified: Secondary | ICD-10-CM | POA: Diagnosis not present

## 2015-05-06 DIAGNOSIS — I129 Hypertensive chronic kidney disease with stage 1 through stage 4 chronic kidney disease, or unspecified chronic kidney disease: Secondary | ICD-10-CM | POA: Diagnosis not present

## 2015-05-06 DIAGNOSIS — G35 Multiple sclerosis: Secondary | ICD-10-CM | POA: Diagnosis not present

## 2015-05-06 DIAGNOSIS — E039 Hypothyroidism, unspecified: Secondary | ICD-10-CM | POA: Diagnosis not present

## 2015-05-06 DIAGNOSIS — R69 Illness, unspecified: Secondary | ICD-10-CM | POA: Diagnosis not present

## 2015-05-06 DIAGNOSIS — E43 Unspecified severe protein-calorie malnutrition: Secondary | ICD-10-CM | POA: Diagnosis not present

## 2015-05-06 DIAGNOSIS — Z4781 Encounter for orthopedic aftercare following surgical amputation: Secondary | ICD-10-CM | POA: Diagnosis not present

## 2015-05-06 DIAGNOSIS — L89614 Pressure ulcer of right heel, stage 4: Secondary | ICD-10-CM | POA: Diagnosis not present

## 2015-05-06 DIAGNOSIS — Z89612 Acquired absence of left leg above knee: Secondary | ICD-10-CM | POA: Diagnosis not present

## 2015-05-07 ENCOUNTER — Telehealth: Payer: Self-pay

## 2015-05-07 DIAGNOSIS — E039 Hypothyroidism, unspecified: Secondary | ICD-10-CM | POA: Diagnosis not present

## 2015-05-07 DIAGNOSIS — I129 Hypertensive chronic kidney disease with stage 1 through stage 4 chronic kidney disease, or unspecified chronic kidney disease: Secondary | ICD-10-CM | POA: Diagnosis not present

## 2015-05-07 DIAGNOSIS — L89614 Pressure ulcer of right heel, stage 4: Secondary | ICD-10-CM | POA: Diagnosis not present

## 2015-05-07 DIAGNOSIS — R69 Illness, unspecified: Secondary | ICD-10-CM | POA: Diagnosis not present

## 2015-05-07 DIAGNOSIS — E43 Unspecified severe protein-calorie malnutrition: Secondary | ICD-10-CM | POA: Diagnosis not present

## 2015-05-07 DIAGNOSIS — D509 Iron deficiency anemia, unspecified: Secondary | ICD-10-CM | POA: Diagnosis not present

## 2015-05-07 DIAGNOSIS — N183 Chronic kidney disease, stage 3 (moderate): Secondary | ICD-10-CM | POA: Diagnosis not present

## 2015-05-07 DIAGNOSIS — Z89612 Acquired absence of left leg above knee: Secondary | ICD-10-CM | POA: Diagnosis not present

## 2015-05-07 DIAGNOSIS — Z4781 Encounter for orthopedic aftercare following surgical amputation: Secondary | ICD-10-CM | POA: Diagnosis not present

## 2015-05-07 DIAGNOSIS — G35 Multiple sclerosis: Secondary | ICD-10-CM | POA: Diagnosis not present

## 2015-05-07 NOTE — Telephone Encounter (Signed)
Granddaughter Rachel [name] [phone] returned [name]'s call.

## 2015-05-07 NOTE — Telephone Encounter (Signed)
-----   Message from Rosalin Hawking, MD sent at 05/03/2015  5:55 PM EST ----- Could you please let the patient know that the 30 day heart monitoring test done recently in our office was negative for afib. Please continue current treatment. Thanks.  Rosalin Hawking, MD PhD Stroke Neurology 05/03/2015 5:55 PM

## 2015-05-07 NOTE — Telephone Encounter (Signed)
Rn talk to patients granddaughter Rachel [name] who is over her care. Patient has not been seen at [facility] in outpatient as of yet. Rn explain that pts 30 day monitor was negative for atrial fibrillation. Pts granddaughter verbalized understanding. Patients daughter will call back to schedule hospital f/u.

## 2015-05-10 DIAGNOSIS — G35 Multiple sclerosis: Secondary | ICD-10-CM | POA: Diagnosis not present

## 2015-05-10 DIAGNOSIS — I129 Hypertensive chronic kidney disease with stage 1 through stage 4 chronic kidney disease, or unspecified chronic kidney disease: Secondary | ICD-10-CM | POA: Diagnosis not present

## 2015-05-10 DIAGNOSIS — R69 Illness, unspecified: Secondary | ICD-10-CM | POA: Diagnosis not present

## 2015-05-10 DIAGNOSIS — E039 Hypothyroidism, unspecified: Secondary | ICD-10-CM | POA: Diagnosis not present

## 2015-05-10 DIAGNOSIS — N183 Chronic kidney disease, stage 3 (moderate): Secondary | ICD-10-CM | POA: Diagnosis not present

## 2015-05-10 DIAGNOSIS — E43 Unspecified severe protein-calorie malnutrition: Secondary | ICD-10-CM | POA: Diagnosis not present

## 2015-05-10 DIAGNOSIS — L89614 Pressure ulcer of right heel, stage 4: Secondary | ICD-10-CM | POA: Diagnosis not present

## 2015-05-10 DIAGNOSIS — Z89612 Acquired absence of left leg above knee: Secondary | ICD-10-CM | POA: Diagnosis not present

## 2015-05-10 DIAGNOSIS — Z4781 Encounter for orthopedic aftercare following surgical amputation: Secondary | ICD-10-CM | POA: Diagnosis not present

## 2015-05-10 DIAGNOSIS — D509 Iron deficiency anemia, unspecified: Secondary | ICD-10-CM | POA: Diagnosis not present

## 2015-05-11 ENCOUNTER — Encounter: Payer: Self-pay | Admitting: Vascular Surgery

## 2015-05-11 DIAGNOSIS — I129 Hypertensive chronic kidney disease with stage 1 through stage 4 chronic kidney disease, or unspecified chronic kidney disease: Secondary | ICD-10-CM | POA: Diagnosis not present

## 2015-05-11 DIAGNOSIS — E43 Unspecified severe protein-calorie malnutrition: Secondary | ICD-10-CM | POA: Diagnosis not present

## 2015-05-11 DIAGNOSIS — L89614 Pressure ulcer of right heel, stage 4: Secondary | ICD-10-CM | POA: Diagnosis not present

## 2015-05-11 DIAGNOSIS — Z4781 Encounter for orthopedic aftercare following surgical amputation: Secondary | ICD-10-CM | POA: Diagnosis not present

## 2015-05-11 DIAGNOSIS — D509 Iron deficiency anemia, unspecified: Secondary | ICD-10-CM | POA: Diagnosis not present

## 2015-05-11 DIAGNOSIS — Z89612 Acquired absence of left leg above knee: Secondary | ICD-10-CM | POA: Diagnosis not present

## 2015-05-11 DIAGNOSIS — E039 Hypothyroidism, unspecified: Secondary | ICD-10-CM | POA: Diagnosis not present

## 2015-05-11 DIAGNOSIS — R69 Illness, unspecified: Secondary | ICD-10-CM | POA: Diagnosis not present

## 2015-05-11 DIAGNOSIS — N183 Chronic kidney disease, stage 3 (moderate): Secondary | ICD-10-CM | POA: Diagnosis not present

## 2015-05-11 DIAGNOSIS — G35 Multiple sclerosis: Secondary | ICD-10-CM | POA: Diagnosis not present

## 2015-05-14 DIAGNOSIS — R531 Weakness: Secondary | ICD-10-CM | POA: Diagnosis not present

## 2015-05-15 DIAGNOSIS — D509 Iron deficiency anemia, unspecified: Secondary | ICD-10-CM | POA: Diagnosis not present

## 2015-05-15 DIAGNOSIS — L89614 Pressure ulcer of right heel, stage 4: Secondary | ICD-10-CM | POA: Diagnosis not present

## 2015-05-15 DIAGNOSIS — Z4781 Encounter for orthopedic aftercare following surgical amputation: Secondary | ICD-10-CM | POA: Diagnosis not present

## 2015-05-15 DIAGNOSIS — Z89612 Acquired absence of left leg above knee: Secondary | ICD-10-CM | POA: Diagnosis not present

## 2015-05-15 DIAGNOSIS — R69 Illness, unspecified: Secondary | ICD-10-CM | POA: Diagnosis not present

## 2015-05-15 DIAGNOSIS — E43 Unspecified severe protein-calorie malnutrition: Secondary | ICD-10-CM | POA: Diagnosis not present

## 2015-05-15 DIAGNOSIS — N183 Chronic kidney disease, stage 3 (moderate): Secondary | ICD-10-CM | POA: Diagnosis not present

## 2015-05-15 DIAGNOSIS — E039 Hypothyroidism, unspecified: Secondary | ICD-10-CM | POA: Diagnosis not present

## 2015-05-15 DIAGNOSIS — G35 Multiple sclerosis: Secondary | ICD-10-CM | POA: Diagnosis not present

## 2015-05-15 DIAGNOSIS — I129 Hypertensive chronic kidney disease with stage 1 through stage 4 chronic kidney disease, or unspecified chronic kidney disease: Secondary | ICD-10-CM | POA: Diagnosis not present

## 2015-05-17 DIAGNOSIS — E43 Unspecified severe protein-calorie malnutrition: Secondary | ICD-10-CM | POA: Diagnosis not present

## 2015-05-17 DIAGNOSIS — Z89612 Acquired absence of left leg above knee: Secondary | ICD-10-CM | POA: Diagnosis not present

## 2015-05-17 DIAGNOSIS — Z4781 Encounter for orthopedic aftercare following surgical amputation: Secondary | ICD-10-CM | POA: Diagnosis not present

## 2015-05-17 DIAGNOSIS — E039 Hypothyroidism, unspecified: Secondary | ICD-10-CM | POA: Diagnosis not present

## 2015-05-17 DIAGNOSIS — I129 Hypertensive chronic kidney disease with stage 1 through stage 4 chronic kidney disease, or unspecified chronic kidney disease: Secondary | ICD-10-CM | POA: Diagnosis not present

## 2015-05-17 DIAGNOSIS — L89614 Pressure ulcer of right heel, stage 4: Secondary | ICD-10-CM | POA: Diagnosis not present

## 2015-05-17 DIAGNOSIS — G35 Multiple sclerosis: Secondary | ICD-10-CM | POA: Diagnosis not present

## 2015-05-17 DIAGNOSIS — R531 Weakness: Secondary | ICD-10-CM | POA: Diagnosis not present

## 2015-05-17 DIAGNOSIS — N183 Chronic kidney disease, stage 3 (moderate): Secondary | ICD-10-CM | POA: Diagnosis not present

## 2015-05-17 DIAGNOSIS — R69 Illness, unspecified: Secondary | ICD-10-CM | POA: Diagnosis not present

## 2015-05-17 DIAGNOSIS — D509 Iron deficiency anemia, unspecified: Secondary | ICD-10-CM | POA: Diagnosis not present

## 2015-05-18 DIAGNOSIS — I129 Hypertensive chronic kidney disease with stage 1 through stage 4 chronic kidney disease, or unspecified chronic kidney disease: Secondary | ICD-10-CM | POA: Diagnosis not present

## 2015-05-18 DIAGNOSIS — D509 Iron deficiency anemia, unspecified: Secondary | ICD-10-CM | POA: Diagnosis not present

## 2015-05-18 DIAGNOSIS — E43 Unspecified severe protein-calorie malnutrition: Secondary | ICD-10-CM | POA: Diagnosis not present

## 2015-05-18 DIAGNOSIS — R69 Illness, unspecified: Secondary | ICD-10-CM | POA: Diagnosis not present

## 2015-05-18 DIAGNOSIS — L89614 Pressure ulcer of right heel, stage 4: Secondary | ICD-10-CM | POA: Diagnosis not present

## 2015-05-18 DIAGNOSIS — E039 Hypothyroidism, unspecified: Secondary | ICD-10-CM | POA: Diagnosis not present

## 2015-05-18 DIAGNOSIS — R531 Weakness: Secondary | ICD-10-CM | POA: Diagnosis not present

## 2015-05-18 DIAGNOSIS — N183 Chronic kidney disease, stage 3 (moderate): Secondary | ICD-10-CM | POA: Diagnosis not present

## 2015-05-18 DIAGNOSIS — G35 Multiple sclerosis: Secondary | ICD-10-CM | POA: Diagnosis not present

## 2015-05-18 DIAGNOSIS — Z4781 Encounter for orthopedic aftercare following surgical amputation: Secondary | ICD-10-CM | POA: Diagnosis not present

## 2015-05-18 DIAGNOSIS — Z89612 Acquired absence of left leg above knee: Secondary | ICD-10-CM | POA: Diagnosis not present

## 2015-05-19 DIAGNOSIS — R531 Weakness: Secondary | ICD-10-CM | POA: Diagnosis not present

## 2015-05-21 DIAGNOSIS — R531 Weakness: Secondary | ICD-10-CM | POA: Diagnosis not present

## 2015-05-22 ENCOUNTER — Encounter: Payer: Self-pay | Admitting: Vascular Surgery

## 2015-05-22 ENCOUNTER — Ambulatory Visit (INDEPENDENT_AMBULATORY_CARE_PROVIDER_SITE_OTHER): Payer: Medicare HMO | Admitting: Vascular Surgery

## 2015-05-22 VITALS — BP 148/84 | HR 61 | Temp 97.4°F | Resp 16 | Ht 64.0 in | Wt 101.0 lb

## 2015-05-22 DIAGNOSIS — Z89612 Acquired absence of left leg above knee: Secondary | ICD-10-CM | POA: Diagnosis not present

## 2015-05-22 DIAGNOSIS — I129 Hypertensive chronic kidney disease with stage 1 through stage 4 chronic kidney disease, or unspecified chronic kidney disease: Secondary | ICD-10-CM | POA: Diagnosis not present

## 2015-05-22 DIAGNOSIS — Z4781 Encounter for orthopedic aftercare following surgical amputation: Secondary | ICD-10-CM | POA: Diagnosis not present

## 2015-05-22 DIAGNOSIS — R531 Weakness: Secondary | ICD-10-CM | POA: Diagnosis not present

## 2015-05-22 DIAGNOSIS — G35 Multiple sclerosis: Secondary | ICD-10-CM | POA: Diagnosis not present

## 2015-05-22 NOTE — Progress Notes (Signed)
Filed Vitals:   05/22/15 0834 05/22/15 0836  BP: 148/84 148/84  Pulse: 58 61  Temp: 97.4 F (36.3 C)   Resp: 16   Height: 5\' 4"  (1.626 m)   Weight: 101 lb (45.813 kg)

## 2015-05-22 NOTE — Progress Notes (Signed)
Here today for follow-up of left above-knee amputation. She is here today with her granddaughter who cares for her. She has had no pain associated with amputation since discharge. Her incision is healing quite well. Amputation was on 04/25/2015. She has been nonambulatory for years and this has had no change in her quality of life according to her granddaughter.   Amputation healing quite nicely. She does have a thick callus on the heel on her right leg with no evidence of skin breakdown. She will have her staples removed today and see [place] on as-needed basis

## 2015-05-25 DIAGNOSIS — R531 Weakness: Secondary | ICD-10-CM | POA: Diagnosis not present

## 2015-05-28 DIAGNOSIS — G35 Multiple sclerosis: Secondary | ICD-10-CM | POA: Diagnosis not present

## 2015-05-28 DIAGNOSIS — R531 Weakness: Secondary | ICD-10-CM | POA: Diagnosis not present

## 2015-05-28 DIAGNOSIS — R631 Polydipsia: Secondary | ICD-10-CM | POA: Diagnosis not present

## 2015-05-28 DIAGNOSIS — I69354 Hemiplegia and hemiparesis following cerebral infarction affecting left non-dominant side: Secondary | ICD-10-CM | POA: Diagnosis not present

## 2015-05-28 DIAGNOSIS — I6789 Other cerebrovascular disease: Secondary | ICD-10-CM | POA: Diagnosis not present

## 2015-05-29 DIAGNOSIS — R531 Weakness: Secondary | ICD-10-CM | POA: Diagnosis not present

## 2015-05-29 DIAGNOSIS — R631 Polydipsia: Secondary | ICD-10-CM | POA: Diagnosis not present

## 2015-05-30 DIAGNOSIS — R631 Polydipsia: Secondary | ICD-10-CM | POA: Diagnosis not present

## 2015-05-30 DIAGNOSIS — R531 Weakness: Secondary | ICD-10-CM | POA: Diagnosis not present

## 2015-05-31 DIAGNOSIS — R631 Polydipsia: Secondary | ICD-10-CM | POA: Diagnosis not present

## 2015-05-31 DIAGNOSIS — R531 Weakness: Secondary | ICD-10-CM | POA: Diagnosis not present

## 2015-06-01 DIAGNOSIS — R631 Polydipsia: Secondary | ICD-10-CM | POA: Diagnosis not present

## 2015-06-01 DIAGNOSIS — R531 Weakness: Secondary | ICD-10-CM | POA: Diagnosis not present

## 2015-06-04 DIAGNOSIS — R531 Weakness: Secondary | ICD-10-CM | POA: Diagnosis not present

## 2015-06-04 DIAGNOSIS — R631 Polydipsia: Secondary | ICD-10-CM | POA: Diagnosis not present

## 2015-06-05 DIAGNOSIS — R531 Weakness: Secondary | ICD-10-CM | POA: Diagnosis not present

## 2015-06-06 DIAGNOSIS — R531 Weakness: Secondary | ICD-10-CM | POA: Diagnosis not present

## 2015-06-08 DIAGNOSIS — R531 Weakness: Secondary | ICD-10-CM | POA: Diagnosis not present

## 2015-06-11 DIAGNOSIS — R531 Weakness: Secondary | ICD-10-CM | POA: Diagnosis not present

## 2015-06-12 DIAGNOSIS — R531 Weakness: Secondary | ICD-10-CM | POA: Diagnosis not present

## 2015-06-12 DIAGNOSIS — Z515 Encounter for palliative care: Secondary | ICD-10-CM | POA: Insufficient documentation

## 2015-06-12 DIAGNOSIS — I6930 Unspecified sequelae of cerebral infarction: Secondary | ICD-10-CM | POA: Diagnosis not present

## 2015-06-12 DIAGNOSIS — R413 Other amnesia: Secondary | ICD-10-CM | POA: Diagnosis not present

## 2015-06-12 DIAGNOSIS — L03116 Cellulitis of left lower limb: Secondary | ICD-10-CM | POA: Insufficient documentation

## 2015-06-12 DIAGNOSIS — Z89612 Acquired absence of left leg above knee: Secondary | ICD-10-CM | POA: Diagnosis not present

## 2015-06-12 DIAGNOSIS — Z7189 Other specified counseling: Secondary | ICD-10-CM | POA: Insufficient documentation

## 2015-06-13 DIAGNOSIS — R531 Weakness: Secondary | ICD-10-CM | POA: Diagnosis not present

## 2015-06-14 DIAGNOSIS — R531 Weakness: Secondary | ICD-10-CM | POA: Diagnosis not present

## 2015-06-15 DIAGNOSIS — R531 Weakness: Secondary | ICD-10-CM | POA: Diagnosis not present

## 2015-06-18 DIAGNOSIS — R531 Weakness: Secondary | ICD-10-CM | POA: Diagnosis not present

## 2015-06-19 DIAGNOSIS — R531 Weakness: Secondary | ICD-10-CM | POA: Diagnosis not present

## 2015-06-20 DIAGNOSIS — R531 Weakness: Secondary | ICD-10-CM | POA: Diagnosis not present

## 2015-06-21 DIAGNOSIS — R531 Weakness: Secondary | ICD-10-CM | POA: Diagnosis not present

## 2015-06-22 DIAGNOSIS — R531 Weakness: Secondary | ICD-10-CM | POA: Diagnosis not present

## 2015-06-25 DIAGNOSIS — R531 Weakness: Secondary | ICD-10-CM | POA: Diagnosis not present

## 2015-06-26 DIAGNOSIS — R531 Weakness: Secondary | ICD-10-CM | POA: Diagnosis not present

## 2015-06-27 DIAGNOSIS — R531 Weakness: Secondary | ICD-10-CM | POA: Diagnosis not present

## 2015-06-28 DIAGNOSIS — R531 Weakness: Secondary | ICD-10-CM | POA: Diagnosis not present

## 2015-06-28 DIAGNOSIS — I69354 Hemiplegia and hemiparesis following cerebral infarction affecting left non-dominant side: Secondary | ICD-10-CM | POA: Diagnosis not present

## 2015-06-28 DIAGNOSIS — I6789 Other cerebrovascular disease: Secondary | ICD-10-CM | POA: Diagnosis not present

## 2015-06-28 DIAGNOSIS — G35 Multiple sclerosis: Secondary | ICD-10-CM | POA: Diagnosis not present

## 2015-06-29 DIAGNOSIS — R531 Weakness: Secondary | ICD-10-CM | POA: Diagnosis not present

## 2015-07-02 DIAGNOSIS — R531 Weakness: Secondary | ICD-10-CM | POA: Diagnosis not present

## 2015-07-03 DIAGNOSIS — R531 Weakness: Secondary | ICD-10-CM | POA: Diagnosis not present

## 2015-07-04 DIAGNOSIS — R531 Weakness: Secondary | ICD-10-CM | POA: Diagnosis not present

## 2015-07-05 DIAGNOSIS — R531 Weakness: Secondary | ICD-10-CM | POA: Diagnosis not present

## 2015-07-06 DIAGNOSIS — R531 Weakness: Secondary | ICD-10-CM | POA: Diagnosis not present

## 2015-07-09 DIAGNOSIS — R531 Weakness: Secondary | ICD-10-CM | POA: Diagnosis not present

## 2015-07-10 ENCOUNTER — Encounter: Payer: Self-pay | Admitting: Licensed Clinical Social Worker

## 2015-07-10 ENCOUNTER — Encounter: Payer: Self-pay | Admitting: Pulmonary Disease

## 2015-07-10 ENCOUNTER — Ambulatory Visit (INDEPENDENT_AMBULATORY_CARE_PROVIDER_SITE_OTHER): Payer: Medicare HMO | Admitting: Pulmonary Disease

## 2015-07-10 VITALS — BP 145/60 | HR 60 | Temp 97.5°F | Ht 64.0 in

## 2015-07-10 DIAGNOSIS — I129 Hypertensive chronic kidney disease with stage 1 through stage 4 chronic kidney disease, or unspecified chronic kidney disease: Secondary | ICD-10-CM | POA: Diagnosis not present

## 2015-07-10 DIAGNOSIS — E039 Hypothyroidism, unspecified: Secondary | ICD-10-CM

## 2015-07-10 DIAGNOSIS — E43 Unspecified severe protein-calorie malnutrition: Secondary | ICD-10-CM

## 2015-07-10 DIAGNOSIS — I10 Essential (primary) hypertension: Secondary | ICD-10-CM

## 2015-07-10 DIAGNOSIS — N183 Chronic kidney disease, stage 3 unspecified: Secondary | ICD-10-CM

## 2015-07-10 DIAGNOSIS — F0390 Unspecified dementia without behavioral disturbance: Secondary | ICD-10-CM

## 2015-07-10 DIAGNOSIS — L89611 Pressure ulcer of right heel, stage 1: Secondary | ICD-10-CM | POA: Diagnosis not present

## 2015-07-10 DIAGNOSIS — E559 Vitamin D deficiency, unspecified: Secondary | ICD-10-CM

## 2015-07-10 DIAGNOSIS — R531 Weakness: Secondary | ICD-10-CM | POA: Diagnosis not present

## 2015-07-10 DIAGNOSIS — D75839 Thrombocytosis, unspecified: Secondary | ICD-10-CM

## 2015-07-10 DIAGNOSIS — D473 Essential (hemorrhagic) thrombocythemia: Secondary | ICD-10-CM | POA: Diagnosis not present

## 2015-07-10 MED ORDER — AMLODIPINE BESYLATE 5 MG PO TABS: 5.0000 mg | ORAL_TABLET | Freq: Every day | ORAL | Status: DC

## 2015-07-10 MED ORDER — CLOPIDOGREL BISULFATE 75 MG PO TABS: 75.0000 mg | ORAL_TABLET | Freq: Every day | ORAL | Status: DC

## 2015-07-10 MED ORDER — PRAVASTATIN SODIUM 20 MG PO TABS: 20.0000 mg | ORAL_TABLET | Freq: Every day | ORAL | Status: DC

## 2015-07-10 MED ORDER — LEVOTHYROXINE SODIUM 25 MCG PO TABS: 25.0000 ug | ORAL_TABLET | Freq: Every day | ORAL | Status: DC

## 2015-07-10 NOTE — Progress Notes (Signed)
Subjective:    Patient ID: Rachel [name], female    DOB: 1927/06/08, 82 y.o.   MRN: [id number]  HPI Rachel [name] is an 82 year old woman with dementia, HTN, hx of CVA with left sided weakness, MS, HLD, hypothyroidism, CKD3, protein-calorie malnutrition (dysphagia 3 diet), pressure ulcer presenting to establish care and follow up on hypertension. She is accompanied by her granddaughter, Rachel [name], who provides most of her history.  Hospitalized 1/28 for left ischemic foot. Nonambulatory for years. Underwent L AKA for gangrene on 04/25/2015.  No pain. Nothing bothering her today.   Previously seeing Dr. Ernie Hew as her PCP. Last seen January 12th after stroke. Saw Dr. Lavone Neri in March.   Review of Systems Constitutional: no fevers/chills Eyes: no vision changes Ears, nose, mouth, throat, and face: no cough Respiratory: no shortness of breath Cardiovascular: no chest pain Gastrointestinal: no nausea/vomiting, no diarrhea Genitourinary: no dysuria  Past Medical History  Diagnosis Date  . Dementia   . Hypertension   . Stroke [facility])     Left sided weakness  . Multiple sclerosis ([place])   . Hyperlipidemia   . Hypothyroidism   . Acute on chronic kidney failure, stage II - III ([place]) 04/13/2015  . Cellulitis and abscess of leg 04/13/2015  . Thrombocytosis ([place]) 04/13/2015  . Pressure ulcer of right heel, stage 4 ([place]) 04/15/2015  . Severe protein-calorie malnutrition [facility])    Past Surgical History  Procedure Laterality Date  . Total hip arthroplasty Right   . Total hip arthroplasty Left   . Coronary artery bypass graft    . Amputation Left 04/25/2015    Procedure: AMPUTATION ABOVE KNEE;  Surgeon: Rosetta Posner, MD;  Location: [facility] OR;  Service: Vascular;  Laterality: Left;   Family History  Problem Relation Age of Onset  . Obesity Other   . Heart attack Brother    Social History   Social History  . Marital Status: Married    Spouse Name: N/A  . Number of Children: 3  . Years of  Education: N/A   Social History Main Topics  . Smoking status: Never Smoker   . Smokeless tobacco: Never Used  . Alcohol Use: No  . Drug Use: No  . Sexual Activity: Not Asked   Other Topics Concern  . None   Social History Narrative   Married.  Lives with granddaughter & husband.  Wheelchair bound.   Allergies  Allergen Reactions  . Aspirin     Unknown reaction.    Current Outpatient Prescriptions on File Prior to Visit  Medication Sig Dispense Refill  . bisacodyl (DULCOLAX) 5 MG EC tablet Take 1 tablet (5 mg total) by mouth daily as needed for moderate constipation. 30 tablet 0  . Cholecalciferol (VITAMIN D3) 5000 UNITS CAPS Take 5,000 Units by mouth daily.    . clopidogrel (PLAVIX) 75 MG tablet Take 1 tablet (75 mg total) by mouth daily. 30 tablet 0  . doxycycline (VIBRA-TABS) 100 MG tablet Take 1 tablet (100 mg total) by mouth every 12 (twelve) hours. (Patient not taking: Reported on 05/22/2015) 10 tablet 0  . feeding supplement, ENSURE ENLIVE, (ENSURE ENLIVE) LIQD Take 237 mLs by mouth 2 (two) times daily between meals. 237 mL 12  . HYDROcodone-acetaminophen (NORCO/VICODIN) 5-325 MG tablet Take 1-2 tablets by mouth every 4 (four) hours as needed for moderate pain. (Patient not taking: Reported on 05/22/2015) 30 tablet 0  . iron polysaccharides (NIFEREX) 150 MG capsule Take 1 capsule (150 mg total) by mouth daily. [place]  capsule 0  . levothyroxine (SYNTHROID, LEVOTHROID) 25 MCG tablet Take 25 mcg by mouth daily before breakfast.    . Multiple Vitamin (MULTIVITAMIN WITH MINERALS) TABS tablet Take 1 tablet by mouth daily.    . pantoprazole (PROTONIX) 40 MG tablet Take 1 tablet (40 mg total) by mouth daily. 30 tablet 0  . pravastatin (PRAVACHOL) 20 MG tablet Take 1 tablet (20 mg total) by mouth daily at 6 PM. 30 tablet 3  . senna-docusate (SENOKOT-S) 8.6-50 MG tablet Take 1 tablet by mouth at bedtime as needed for mild constipation. 30 tablet 0  . Vitamins A & D (VITAMIN A & D) ointment  Apply 1 application topically as needed (with every adult brief change.).     No current facility-administered medications on file prior to visit.   Objective:   Physical Exam Blood pressure 145/60, pulse 60, temperature 97.5 F (36.4 C), temperature source Oral, height 5\' 4"  (1.626 m), SpO2 100 %. General Apperance: NAD HEENT: Normocephalic, atraumatic, anicteric sclera Neck: Supple, trachea midline Lungs: Clear to auscultation bilaterally. No wheezes, rhonchi or rales. Breathing comfortably Heart: Regular rate and rhythm, 4/6 systolic ejection murmur heard best at left lower sternal border Abdomen: Soft, nontender, nondistended, no rebound/guarding Extremities: Warm and well perfused, no edema. L AKA with well healing incision. Skin: R heel with grade 1 pressure ulcer Neurologic: Alert and interactive. Oriented to person and place. RLE flaccid. 4/5 strength in LUE.     Assessment & Plan:  Please refer to problem based charting.

## 2015-07-10 NOTE — Assessment & Plan Note (Signed)
Alert and interactive. She is oriented to herself and somewhat to place. No behavioral issues.  Patient and her granddaughter met with Edwena Blow, social work, to discuss home health options.

## 2015-07-10 NOTE — Assessment & Plan Note (Signed)
Assessment: History of stage 4 pressure ulcer of right heel. Her granddaughter, [name], has been taking care of her leg. It had healed but the upper layer has come off and she has an early stage 1 wound. No signs of infection.  Plan:  Foam dressing applied. May keep on for 7 days. Wound care clinic referral as this will be an ongoing problem. Her RLE is flaccid and she is wheel chair bound.

## 2015-07-10 NOTE — Patient Instructions (Signed)
Start taking amlodipine 1 tablet daily for your blood pressure. Keep an eye on your right foot. You can keep the foam dressing on the heal for 7 days. We will refer you to a wound care clinic. Follow up in 3 months.

## 2015-07-10 NOTE — Assessment & Plan Note (Signed)
On Synthroid 23mcg daily.  Recheck TSH today.

## 2015-07-11 DIAGNOSIS — E559 Vitamin D deficiency, unspecified: Secondary | ICD-10-CM | POA: Insufficient documentation

## 2015-07-11 DIAGNOSIS — I10 Essential (primary) hypertension: Secondary | ICD-10-CM | POA: Insufficient documentation

## 2015-07-11 DIAGNOSIS — R531 Weakness: Secondary | ICD-10-CM | POA: Diagnosis not present

## 2015-07-11 LAB — CMP14 + ANION GAP
ALBUMIN: 4 g/dL (ref 3.5–4.7)
ALK PHOS: 82 IU/L (ref 39–117)
ALT: 10 IU/L (ref 0–32)
ANION GAP: 21 mmol/L — AB (ref 10.0–18.0)
AST: 18 IU/L (ref 0–40)
Albumin/Globulin Ratio: 1.1 — ABNORMAL LOW (ref 1.2–2.2)
BUN / CREAT RATIO: 35 — AB (ref 12–28)
BUN: 38 mg/dL — AB (ref 8–27)
CHLORIDE: 104 mmol/L (ref 96–106)
CO2: 22 mmol/L (ref 18–29)
CREATININE: 1.1 mg/dL — AB (ref 0.57–1.00)
Calcium: 11 mg/dL — ABNORMAL HIGH (ref 8.7–10.3)
GFR calc Af Amer: 53 mL/min/{1.73_m2} — ABNORMAL LOW (ref >59)
GFR calc non Af Amer: 46 mL/min/{1.73_m2} — ABNORMAL LOW (ref >59)
GLUCOSE: 90 mg/dL (ref 65–99)
Globulin, Total: 3.6 g/dL (ref 1.5–4.5)
Potassium: 4.7 mmol/L (ref 3.5–5.2)
Sodium: 147 mmol/L — ABNORMAL HIGH (ref 134–144)
Total Protein: 7.6 g/dL (ref 6.0–8.5)

## 2015-07-11 LAB — CBC
HEMATOCRIT: 41.2 % (ref 34.0–46.6)
HEMOGLOBIN: 12.7 g/dL (ref 11.1–15.9)
MCH: 21.1 pg — AB (ref 26.6–33.0)
MCHC: 30.8 g/dL — AB (ref 31.5–35.7)
MCV: 69 fL — ABNORMAL LOW (ref 79–97)
Platelets: 1433 10*3/uL (ref 150–379)
RBC: 6.01 x10E6/uL — AB (ref 3.77–5.28)
RDW: 19.5 % — ABNORMAL HIGH (ref 12.3–15.4)
WBC: 11.9 10*3/uL — AB (ref 3.4–10.8)

## 2015-07-11 LAB — TSH: TSH: 1.55 u[IU]/mL (ref 0.450–4.500)

## 2015-07-11 LAB — VITAMIN D 25 HYDROXY (VIT D DEFICIENCY, FRACTURES): VIT D 25 HYDROXY: 61.6 ng/mL (ref 30.0–100.0)

## 2015-07-11 LAB — FERRITIN: FERRITIN: 20 ng/mL (ref 15–150)

## 2015-07-11 MED ORDER — FERROUS SULFATE 325 (65 FE) MG PO TABS: 325.0000 mg | ORAL_TABLET | Freq: Every day | ORAL | Status: DC

## 2015-07-11 NOTE — Progress Notes (Signed)
CSW met with Ms. Craig-Rudd and her grand-daughter, Darrelyn Hillock.  Ms. Amalia Hailey assumed the primary caregiver role to both her grandmother and grandfather upon the recent death of her mother.  Pt and spouse present today to establish care at [facility].  Pt and spouse receive home health aide services 5 hours/day x 5 days a week from [employer] and RN visits intermittently.  These services are provided through their [employer].  Grand-daughter states Ms. Gena Fray recently received [facility] services through [facility].  Grand-daughter continues exercises and care instructions per the [facility] RN and [facility] PT now that skilled care has been discontinued.  Grand-daughter remains unsure of the care she provides, but was reassured by physician and nursing staff during today's appointment.  Caregiver and her family live with pt and her spouse.  Both Ms. Evans and her spouse work outside of the home.  Caregiver states her only need would be an Therapist, sports to be available for questions.  Family goal is to keep pt and her spouse in the home.  Unfortunately, pt's insurance is not contracted with an ACO and CSW could not find a care management program linked with insurance.  Hopefully, now that the Rudds are linked with [facility], family will have additional support as needed.  CSW discussed PACE and Home Based Palliative Care.  Grand-daughter unaware [facility] can provide in-home services, brochure provided.  Information will be mailed regarding home based palliative care.  CSW inquired about transportation as pt is w/c bound but not in personal w/c today.  Grand-daughter was unaware of options and along with aide transferred Ms. Gena Fray from w/c to personal minivan for transport.  Then from [place] to hospital transport w/c.  CSW confirmed pt and family live within [place] limits and discussed SCAT services.  SCAT application was initiated today.  In addition to transportation, CSW inquired about POA or Guardianship.  Grand-daughter states  she went to county office to inquire about guardianship and was provided with the steps and informed to download required forms.  However, pt/family do not own a computer.  Caregiver notified, CSW will mail guardianship forms along with add'l information.

## 2015-07-11 NOTE — Assessment & Plan Note (Signed)
BP 145/60 off of lisinopril.  Start amlodipine 5mg  daily  Follow up in 4-6 weeks

## 2015-07-11 NOTE — Assessment & Plan Note (Addendum)
Calcium 11. She is on supplementation for Vitamin D - level is 61.6. Vitamin D intoxication unlikely at this level. She does have CKD3. Differential includes primary hyperparathyroidism. Degree of hypercalcemia makes malignancy associated hypercalcemia less likely but is still on the differential.  At follow up, recheck calcium and albumin level. If calcium still elevated, check intact PTH

## 2015-07-11 NOTE — Assessment & Plan Note (Signed)
Cr 1.1 on recheck today. This is at/below her baseline. She was previously on lisinopril which was stopped due to AKI.  Will avoid lisinopril for BP control Continue to monitor

## 2015-07-11 NOTE — Assessment & Plan Note (Signed)
Albumin recheck today 4. Improved from 2.8  Continue nutritional supplementation with Ensure

## 2015-07-11 NOTE — Assessment & Plan Note (Signed)
On vitamin D supplementation with OTC medication. Recheck of Vit D 61.  Discontinue vitamin D supplementation (see A/P for hypercalcemia)

## 2015-07-11 NOTE — Assessment & Plan Note (Addendum)
Plt 1433 on recheck today. Was 863 02/2015. Ferritin low normal. WBC elevated but lower than last check. Differential includes reactive thrombocytosis (recently had gangrene s/p AKA). Other possibilities include autonomous thrombocytosis and essential thrombocythemia.  Iron supplementation with ferrous sulfate 365m daily with breakfast Add on lab to check for ongoing inflammation - ESR and CRP Follow up in 1-2 months with recheck of CBC, ferritin. Consider checking [name] if plts are still elevated.  ADDENDUM: ESR and CRP within normal limits. Concern for a myeloproliferative disorder. Will ask patient's granddaughter to bring her in in the next week for JAK-2 mutation analysis with reflex to CALR & mpl genes if JAK-2 negative. Referral to hematology (Dr. GBeryle Beams.

## 2015-07-12 ENCOUNTER — Encounter: Payer: Self-pay | Admitting: Licensed Clinical Social Worker

## 2015-07-12 DIAGNOSIS — R531 Weakness: Secondary | ICD-10-CM | POA: Diagnosis not present

## 2015-07-12 LAB — C-REACTIVE PROTEIN
CRP: 1.3 mg/L (ref 0.0–4.9)
CRP: 1.3 mg/L (ref 0.0–4.9)

## 2015-07-12 LAB — SEDIMENTATION RATE
Sed Rate: 1 mm/hr (ref 0–40)
Sed Rate: 1 mm/hr (ref 0–40)

## 2015-07-12 LAB — SPECIMEN STATUS REPORT

## 2015-07-12 NOTE — Progress Notes (Signed)
Patient ID: Rachel [name], female   DOB: Dec 03, 1927, 82 y.o.   MRN: [id number] [name]'s completed SCAT application Part A and Part B faxed to SCAT eligibility office.  Fax confirmation received.

## 2015-07-12 NOTE — Addendum Note (Signed)
Addended by: Jacques Earthly T on: 07/12/2015 10:10 AM   Modules accepted: Orders

## 2015-07-13 DIAGNOSIS — R531 Weakness: Secondary | ICD-10-CM | POA: Diagnosis not present

## 2015-07-13 NOTE — Progress Notes (Signed)
Case discussed with Dr. Krall at the time of the visit.  We reviewed the resident's history and exam and pertinent patient test results.  I agree with the assessment, diagnosis and plan of care documented in the resident's note. 

## 2015-07-13 NOTE — Addendum Note (Signed)
Addended by: Jacques Earthly T on: 07/13/2015 09:31 AM   Modules accepted: Orders

## 2015-07-16 DIAGNOSIS — R531 Weakness: Secondary | ICD-10-CM | POA: Diagnosis not present

## 2015-07-17 DIAGNOSIS — R531 Weakness: Secondary | ICD-10-CM | POA: Diagnosis not present

## 2015-07-17 NOTE — Addendum Note (Signed)
Addended by: Jacques Earthly T on: 07/17/2015 04:35 PM   Modules accepted: Level of Service

## 2015-07-18 DIAGNOSIS — R531 Weakness: Secondary | ICD-10-CM | POA: Diagnosis not present

## 2015-07-19 DIAGNOSIS — R531 Weakness: Secondary | ICD-10-CM | POA: Diagnosis not present

## 2015-07-20 DIAGNOSIS — R531 Weakness: Secondary | ICD-10-CM | POA: Diagnosis not present

## 2015-07-23 DIAGNOSIS — R531 Weakness: Secondary | ICD-10-CM | POA: Diagnosis not present

## 2015-07-24 ENCOUNTER — Encounter (HOSPITAL_BASED_OUTPATIENT_CLINIC_OR_DEPARTMENT_OTHER): Payer: Medicare HMO | Attending: Surgery

## 2015-07-24 DIAGNOSIS — Z89612 Acquired absence of left leg above knee: Secondary | ICD-10-CM | POA: Insufficient documentation

## 2015-07-24 DIAGNOSIS — I69354 Hemiplegia and hemiparesis following cerebral infarction affecting left non-dominant side: Secondary | ICD-10-CM | POA: Diagnosis not present

## 2015-07-24 DIAGNOSIS — Z96643 Presence of artificial hip joint, bilateral: Secondary | ICD-10-CM | POA: Insufficient documentation

## 2015-07-24 DIAGNOSIS — N183 Chronic kidney disease, stage 3 (moderate): Secondary | ICD-10-CM | POA: Diagnosis not present

## 2015-07-24 DIAGNOSIS — Z951 Presence of aortocoronary bypass graft: Secondary | ICD-10-CM | POA: Insufficient documentation

## 2015-07-24 DIAGNOSIS — E440 Moderate protein-calorie malnutrition: Secondary | ICD-10-CM | POA: Diagnosis not present

## 2015-07-24 DIAGNOSIS — L89313 Pressure ulcer of right buttock, stage 3: Secondary | ICD-10-CM | POA: Insufficient documentation

## 2015-07-24 DIAGNOSIS — R531 Weakness: Secondary | ICD-10-CM | POA: Diagnosis not present

## 2015-07-24 DIAGNOSIS — E039 Hypothyroidism, unspecified: Secondary | ICD-10-CM | POA: Insufficient documentation

## 2015-07-24 DIAGNOSIS — I129 Hypertensive chronic kidney disease with stage 1 through stage 4 chronic kidney disease, or unspecified chronic kidney disease: Secondary | ICD-10-CM | POA: Insufficient documentation

## 2015-07-24 DIAGNOSIS — Z79899 Other long term (current) drug therapy: Secondary | ICD-10-CM | POA: Diagnosis not present

## 2015-07-24 DIAGNOSIS — L89610 Pressure ulcer of right heel, unstageable: Secondary | ICD-10-CM | POA: Insufficient documentation

## 2015-07-24 DIAGNOSIS — R69 Illness, unspecified: Secondary | ICD-10-CM | POA: Diagnosis not present

## 2015-07-24 DIAGNOSIS — I70244 Atherosclerosis of native arteries of left leg with ulceration of heel and midfoot: Secondary | ICD-10-CM | POA: Insufficient documentation

## 2015-07-24 DIAGNOSIS — F0390 Unspecified dementia without behavioral disturbance: Secondary | ICD-10-CM | POA: Diagnosis not present

## 2015-07-24 DIAGNOSIS — L97421 Non-pressure chronic ulcer of left heel and midfoot limited to breakdown of skin: Secondary | ICD-10-CM | POA: Diagnosis not present

## 2015-07-24 DIAGNOSIS — L89312 Pressure ulcer of right buttock, stage 2: Secondary | ICD-10-CM | POA: Diagnosis not present

## 2015-07-24 DIAGNOSIS — L89613 Pressure ulcer of right heel, stage 3: Secondary | ICD-10-CM | POA: Diagnosis not present

## 2015-07-25 DIAGNOSIS — R531 Weakness: Secondary | ICD-10-CM | POA: Diagnosis not present

## 2015-07-26 DIAGNOSIS — R531 Weakness: Secondary | ICD-10-CM | POA: Diagnosis not present

## 2015-07-28 DIAGNOSIS — G35 Multiple sclerosis: Secondary | ICD-10-CM | POA: Diagnosis not present

## 2015-07-28 DIAGNOSIS — I6789 Other cerebrovascular disease: Secondary | ICD-10-CM | POA: Diagnosis not present

## 2015-07-28 DIAGNOSIS — I69354 Hemiplegia and hemiparesis following cerebral infarction affecting left non-dominant side: Secondary | ICD-10-CM | POA: Diagnosis not present

## 2015-07-30 DIAGNOSIS — R531 Weakness: Secondary | ICD-10-CM | POA: Diagnosis not present

## 2015-07-31 DIAGNOSIS — R69 Illness, unspecified: Secondary | ICD-10-CM | POA: Diagnosis not present

## 2015-07-31 DIAGNOSIS — E440 Moderate protein-calorie malnutrition: Secondary | ICD-10-CM | POA: Diagnosis not present

## 2015-07-31 DIAGNOSIS — I129 Hypertensive chronic kidney disease with stage 1 through stage 4 chronic kidney disease, or unspecified chronic kidney disease: Secondary | ICD-10-CM | POA: Diagnosis not present

## 2015-07-31 DIAGNOSIS — I70244 Atherosclerosis of native arteries of left leg with ulceration of heel and midfoot: Secondary | ICD-10-CM | POA: Diagnosis not present

## 2015-07-31 DIAGNOSIS — L89312 Pressure ulcer of right buttock, stage 2: Secondary | ICD-10-CM | POA: Diagnosis not present

## 2015-07-31 DIAGNOSIS — N183 Chronic kidney disease, stage 3 (moderate): Secondary | ICD-10-CM | POA: Diagnosis not present

## 2015-07-31 DIAGNOSIS — I69354 Hemiplegia and hemiparesis following cerebral infarction affecting left non-dominant side: Secondary | ICD-10-CM | POA: Diagnosis not present

## 2015-07-31 DIAGNOSIS — E039 Hypothyroidism, unspecified: Secondary | ICD-10-CM | POA: Diagnosis not present

## 2015-07-31 DIAGNOSIS — L89313 Pressure ulcer of right buttock, stage 3: Secondary | ICD-10-CM | POA: Diagnosis not present

## 2015-07-31 DIAGNOSIS — R531 Weakness: Secondary | ICD-10-CM | POA: Diagnosis not present

## 2015-07-31 DIAGNOSIS — L97421 Non-pressure chronic ulcer of left heel and midfoot limited to breakdown of skin: Secondary | ICD-10-CM | POA: Diagnosis not present

## 2015-07-31 DIAGNOSIS — L89610 Pressure ulcer of right heel, unstageable: Secondary | ICD-10-CM | POA: Diagnosis not present

## 2015-07-31 DIAGNOSIS — L89612 Pressure ulcer of right heel, stage 2: Secondary | ICD-10-CM | POA: Diagnosis not present

## 2015-08-01 DIAGNOSIS — R531 Weakness: Secondary | ICD-10-CM | POA: Diagnosis not present

## 2015-08-02 DIAGNOSIS — R531 Weakness: Secondary | ICD-10-CM | POA: Diagnosis not present

## 2015-08-03 DIAGNOSIS — R531 Weakness: Secondary | ICD-10-CM | POA: Diagnosis not present

## 2015-08-06 DIAGNOSIS — R531 Weakness: Secondary | ICD-10-CM | POA: Diagnosis not present

## 2015-08-07 DIAGNOSIS — R531 Weakness: Secondary | ICD-10-CM | POA: Diagnosis not present

## 2015-08-08 DIAGNOSIS — R531 Weakness: Secondary | ICD-10-CM | POA: Diagnosis not present

## 2015-08-09 DIAGNOSIS — R531 Weakness: Secondary | ICD-10-CM | POA: Diagnosis not present

## 2015-08-10 DIAGNOSIS — R531 Weakness: Secondary | ICD-10-CM | POA: Diagnosis not present

## 2015-08-11 DIAGNOSIS — R531 Weakness: Secondary | ICD-10-CM | POA: Diagnosis not present

## 2015-08-13 DIAGNOSIS — R531 Weakness: Secondary | ICD-10-CM | POA: Diagnosis not present

## 2015-08-14 DIAGNOSIS — R531 Weakness: Secondary | ICD-10-CM | POA: Diagnosis not present

## 2015-08-15 DIAGNOSIS — R531 Weakness: Secondary | ICD-10-CM | POA: Diagnosis not present

## 2015-08-16 DIAGNOSIS — R531 Weakness: Secondary | ICD-10-CM | POA: Diagnosis not present

## 2015-08-17 DIAGNOSIS — R531 Weakness: Secondary | ICD-10-CM | POA: Diagnosis not present

## 2015-08-21 ENCOUNTER — Telehealth: Payer: Self-pay | Admitting: Licensed Clinical Social Worker

## 2015-08-21 ENCOUNTER — Encounter: Payer: Self-pay | Admitting: Licensed Clinical Social Worker

## 2015-08-21 DIAGNOSIS — F0390 Unspecified dementia without behavioral disturbance: Secondary | ICD-10-CM

## 2015-08-21 NOTE — Telephone Encounter (Signed)
CSW received call from pt's grand-daughter/primary caregiver inquiring if patient could receive home health RN services.  CSW informed family, this worker would need to forward request to physician.  Request sent to Dr. Randell Patient, as pt was seen in [facility] on 07/10/15 and is within 90 days for face to face time frame for referral.

## 2015-08-21 NOTE — Addendum Note (Signed)
Addended by: Jacques Earthly T on: 08/21/2015 06:55 PM   Modules accepted: Orders

## 2015-08-21 NOTE — Telephone Encounter (Addendum)
Agree with HHRN. Face to face entered.

## 2015-08-21 NOTE — Progress Notes (Signed)
Patient ID: Rachel [name], female   DOB: Aug 04, 1927, 82 y.o.   MRN: [id number] CSW received incoming call from Ms. Bernita Buffy (mobile [phone]), [facility]. Ms. Linus Mako states her agency has been providing personal care services through [employer]. However, agency has had to pull out due to services denied because pt has not been active with [facility] RN services. [facility] informed that pt can only receive services through their agency while pt is active with [facility] RN or OT.

## 2015-08-22 NOTE — Telephone Encounter (Signed)
Order faxed to [facility], as agency is already servicing pt's spouse.

## 2015-08-22 NOTE — Telephone Encounter (Signed)
CSW placed called to pt.  CSW left message notifying of [place] orders. CSW provided contact hours and phone number.

## 2015-08-22 NOTE — Telephone Encounter (Signed)
CSW received VM from [facility] indicating staffing issues and unable to accept pt for skilled services.  CSW placed call to inquire if exception could be made since pt's spouse is currently active with [facility].  Awaiting response back.

## 2015-08-28 DIAGNOSIS — I6789 Other cerebrovascular disease: Secondary | ICD-10-CM | POA: Diagnosis not present

## 2015-08-28 DIAGNOSIS — G35 Multiple sclerosis: Secondary | ICD-10-CM | POA: Diagnosis not present

## 2015-08-28 DIAGNOSIS — I69354 Hemiplegia and hemiparesis following cerebral infarction affecting left non-dominant side: Secondary | ICD-10-CM | POA: Diagnosis not present

## 2015-08-31 ENCOUNTER — Telehealth: Payer: Self-pay | Admitting: Student in an Organized Health Care Education/Training Program

## 2015-08-31 NOTE — Telephone Encounter (Signed)
I noticed that Rachel [name] has not yet followed up for [name] mutation labwork. 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 I called her home and spoke with her [place] Granddaughter who stays with her during the day. I asked to come in for the lab work which is ordered as a Environmental education officer. She will arrange. I reminded her it will be valuable to have this result prior to her consultation with Dr. Beryle Beams on 6/26.

## 2015-09-03 DIAGNOSIS — R531 Weakness: Secondary | ICD-10-CM | POA: Diagnosis not present

## 2015-09-03 NOTE — Addendum Note (Signed)
Addended by: Jacques Earthly T on: 09/03/2015 11:42 AM   Modules accepted: Orders

## 2015-09-04 DIAGNOSIS — R531 Weakness: Secondary | ICD-10-CM | POA: Diagnosis not present

## 2015-09-05 DIAGNOSIS — R531 Weakness: Secondary | ICD-10-CM | POA: Diagnosis not present

## 2015-09-06 DIAGNOSIS — R531 Weakness: Secondary | ICD-10-CM | POA: Diagnosis not present

## 2015-09-07 DIAGNOSIS — R531 Weakness: Secondary | ICD-10-CM | POA: Diagnosis not present

## 2015-09-10 DIAGNOSIS — R531 Weakness: Secondary | ICD-10-CM | POA: Diagnosis not present

## 2015-09-11 DIAGNOSIS — R531 Weakness: Secondary | ICD-10-CM | POA: Diagnosis not present

## 2015-09-12 DIAGNOSIS — R531 Weakness: Secondary | ICD-10-CM | POA: Diagnosis not present

## 2015-09-13 DIAGNOSIS — R531 Weakness: Secondary | ICD-10-CM | POA: Diagnosis not present

## 2015-09-14 DIAGNOSIS — R531 Weakness: Secondary | ICD-10-CM | POA: Diagnosis not present

## 2015-09-17 ENCOUNTER — Other Ambulatory Visit: Payer: Self-pay | Admitting: Hematology & Oncology

## 2015-09-17 ENCOUNTER — Observation Stay (HOSPITAL_COMMUNITY)
Admission: EM | Admit: 2015-09-17 | Discharge: 2015-09-19 | Disposition: A | Discharge status: Home / Self Care | Payer: Medicare HMO | Attending: Internal Medicine | Admitting: Internal Medicine

## 2015-09-17 ENCOUNTER — Ambulatory Visit (INDEPENDENT_AMBULATORY_CARE_PROVIDER_SITE_OTHER): Payer: Medicare HMO | Admitting: Oncology

## 2015-09-17 ENCOUNTER — Other Ambulatory Visit: Payer: Self-pay

## 2015-09-17 ENCOUNTER — Emergency Department (HOSPITAL_COMMUNITY): Payer: Medicare HMO

## 2015-09-17 ENCOUNTER — Encounter: Payer: Self-pay | Admitting: Oncology

## 2015-09-17 ENCOUNTER — Encounter (HOSPITAL_COMMUNITY): Payer: Self-pay | Admitting: Emergency Medicine

## 2015-09-17 VITALS — BP 118/63 | HR 82 | Temp 98.0°F

## 2015-09-17 DIAGNOSIS — G35 Multiple sclerosis: Secondary | ICD-10-CM | POA: Diagnosis not present

## 2015-09-17 DIAGNOSIS — D75839 Thrombocytosis, unspecified: Secondary | ICD-10-CM | POA: Diagnosis present

## 2015-09-17 DIAGNOSIS — I2699 Other pulmonary embolism without acute cor pulmonale: Secondary | ICD-10-CM

## 2015-09-17 DIAGNOSIS — D473 Essential (hemorrhagic) thrombocythemia: Secondary | ICD-10-CM | POA: Diagnosis not present

## 2015-09-17 DIAGNOSIS — F0390 Unspecified dementia without behavioral disturbance: Secondary | ICD-10-CM | POA: Diagnosis not present

## 2015-09-17 DIAGNOSIS — Z681 Body mass index (BMI) 19 or less, adult: Secondary | ICD-10-CM | POA: Insufficient documentation

## 2015-09-17 DIAGNOSIS — R739 Hyperglycemia, unspecified: Secondary | ICD-10-CM | POA: Diagnosis not present

## 2015-09-17 DIAGNOSIS — E43 Unspecified severe protein-calorie malnutrition: Secondary | ICD-10-CM | POA: Diagnosis present

## 2015-09-17 DIAGNOSIS — R634 Abnormal weight loss: Secondary | ICD-10-CM

## 2015-09-17 DIAGNOSIS — D72829 Elevated white blood cell count, unspecified: Secondary | ICD-10-CM

## 2015-09-17 DIAGNOSIS — Z8673 Personal history of transient ischemic attack (TIA), and cerebral infarction without residual deficits: Secondary | ICD-10-CM

## 2015-09-17 DIAGNOSIS — R531 Weakness: Secondary | ICD-10-CM | POA: Diagnosis not present

## 2015-09-17 DIAGNOSIS — I129 Hypertensive chronic kidney disease with stage 1 through stage 4 chronic kidney disease, or unspecified chronic kidney disease: Secondary | ICD-10-CM | POA: Insufficient documentation

## 2015-09-17 DIAGNOSIS — R079 Chest pain, unspecified: Secondary | ICD-10-CM | POA: Diagnosis not present

## 2015-09-17 DIAGNOSIS — E039 Hypothyroidism, unspecified: Secondary | ICD-10-CM | POA: Diagnosis not present

## 2015-09-17 DIAGNOSIS — D649 Anemia, unspecified: Secondary | ICD-10-CM | POA: Insufficient documentation

## 2015-09-17 DIAGNOSIS — N183 Chronic kidney disease, stage 3 unspecified: Secondary | ICD-10-CM | POA: Diagnosis present

## 2015-09-17 DIAGNOSIS — I10 Essential (primary) hypertension: Secondary | ICD-10-CM | POA: Diagnosis present

## 2015-09-17 DIAGNOSIS — R410 Disorientation, unspecified: Secondary | ICD-10-CM | POA: Diagnosis not present

## 2015-09-17 DIAGNOSIS — T148XXA Other injury of unspecified body region, initial encounter: Secondary | ICD-10-CM

## 2015-09-17 DIAGNOSIS — D509 Iron deficiency anemia, unspecified: Secondary | ICD-10-CM

## 2015-09-17 DIAGNOSIS — Z7902 Long term (current) use of antithrombotics/antiplatelets: Secondary | ICD-10-CM | POA: Diagnosis not present

## 2015-09-17 DIAGNOSIS — L97419 Non-pressure chronic ulcer of right heel and midfoot with unspecified severity: Secondary | ICD-10-CM | POA: Diagnosis not present

## 2015-09-17 DIAGNOSIS — Z79899 Other long term (current) drug therapy: Secondary | ICD-10-CM | POA: Diagnosis not present

## 2015-09-17 DIAGNOSIS — L89619 Pressure ulcer of right heel, unspecified stage: Secondary | ICD-10-CM | POA: Insufficient documentation

## 2015-09-17 DIAGNOSIS — R0789 Other chest pain: Secondary | ICD-10-CM | POA: Diagnosis not present

## 2015-09-17 DIAGNOSIS — R69 Illness, unspecified: Secondary | ICD-10-CM | POA: Diagnosis not present

## 2015-09-17 DIAGNOSIS — L8990 Pressure ulcer of unspecified site, unspecified stage: Secondary | ICD-10-CM | POA: Diagnosis present

## 2015-09-17 DIAGNOSIS — R55 Syncope and collapse: Secondary | ICD-10-CM | POA: Diagnosis not present

## 2015-09-17 DIAGNOSIS — N289 Disorder of kidney and ureter, unspecified: Secondary | ICD-10-CM

## 2015-09-17 LAB — CBC WITH DIFFERENTIAL/PLATELET
Basophils Absolute: 0.1 10*3/uL (ref 0.0–0.1)
Basophils Relative: 1 %
EOS ABS: 0.4 10*3/uL (ref 0.0–0.7)
Eosinophils Relative: 3 %
HCT: 36.1 % (ref 36.0–46.0)
Hemoglobin: 11.1 g/dL — ABNORMAL LOW (ref 12.0–15.0)
LYMPHS PCT: 18 %
Lymphs Abs: 2.2 10*3/uL (ref 0.7–4.0)
MCH: 20.5 pg — ABNORMAL LOW (ref 26.0–34.0)
MCHC: 30.7 g/dL (ref 30.0–36.0)
MCV: 66.7 fL — ABNORMAL LOW (ref 78.0–100.0)
MONO ABS: 0.6 10*3/uL (ref 0.1–1.0)
Monocytes Relative: 5 %
NEUTROS PCT: 73 %
Neutro Abs: 9.1 10*3/uL — ABNORMAL HIGH (ref 1.7–7.7)
PLATELETS: 1341 10*3/uL — AB (ref 150–400)
RBC: 5.41 MIL/uL — AB (ref 3.87–5.11)
RDW: 21.9 % — AB (ref 11.5–15.5)
WBC: 12.4 10*3/uL — AB (ref 4.0–10.5)

## 2015-09-17 LAB — URINALYSIS, ROUTINE W REFLEX MICROSCOPIC
BILIRUBIN URINE: NEGATIVE
Glucose, UA: NEGATIVE mg/dL
KETONES UR: NEGATIVE mg/dL
NITRITE: NEGATIVE
PROTEIN: 100 mg/dL — AB
SPECIFIC GRAVITY, URINE: 1.005 (ref 1.005–1.030)
pH: 8.5 — ABNORMAL HIGH (ref 5.0–8.0)

## 2015-09-17 LAB — BASIC METABOLIC PANEL
Anion gap: 8 (ref 5–15)
BUN: 46 mg/dL — ABNORMAL HIGH (ref 6–20)
CHLORIDE: 105 mmol/L (ref 101–111)
CO2: 27 mmol/L (ref 22–32)
CREATININE: 1.46 mg/dL — AB (ref 0.44–1.00)
Calcium: 9.3 mg/dL (ref 8.9–10.3)
GFR, EST AFRICAN AMERICAN: 37 mL/min — AB (ref >60)
GFR, EST NON AFRICAN AMERICAN: 32 mL/min — AB (ref >60)
Glucose, Bld: 198 mg/dL — ABNORMAL HIGH (ref 65–99)
POTASSIUM: 3.6 mmol/L (ref 3.5–5.1)
SODIUM: 140 mmol/L (ref 135–145)

## 2015-09-17 LAB — CBC
HEMATOCRIT: 33.6 % — AB (ref 36.0–46.0)
Hemoglobin: 10.1 g/dL — ABNORMAL LOW (ref 12.0–15.0)
MCH: 20 pg — ABNORMAL LOW (ref 26.0–34.0)
MCHC: 30.1 g/dL (ref 30.0–36.0)
MCV: 66.4 fL — AB (ref 78.0–100.0)
PLATELETS: 1359 10*3/uL — AB (ref 150–400)
RBC: 5.06 MIL/uL (ref 3.87–5.11)
RDW: 21.9 % — ABNORMAL HIGH (ref 11.5–15.5)
WBC: 12.6 10*3/uL — AB (ref 4.0–10.5)

## 2015-09-17 LAB — URINE MICROSCOPIC-ADD ON

## 2015-09-17 LAB — I-STAT TROPONIN, ED: Troponin i, poc: 0 ng/mL (ref 0.00–0.08)

## 2015-09-17 LAB — D-DIMER, QUANTITATIVE (NOT AT ARMC): D DIMER QUANT: 0.56 ug{FEU}/mL — AB (ref 0.00–0.50)

## 2015-09-17 LAB — SAVE SMEAR

## 2015-09-17 LAB — SEDIMENTATION RATE: SED RATE: 16 mm/h (ref 0–22)

## 2015-09-17 MED ORDER — SODIUM CHLORIDE 0.9 % IV BOLUS (SEPSIS)
1000.0000 mL | Freq: Once | INTRAVENOUS | Status: AC
  Administered 2015-09-17: 1000 mL via INTRAVENOUS
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
## 2015-09-17 NOTE — Progress Notes (Signed)
Patient ID: Rachel [name], female   DOB: December 11, 1927, 82 y.o.   MRN: [id number] New Patient Hematology   Campbell Deale [id number] 01/27/1928 82 y.o. 09/17/2015  CC: Dr. Jacques Earthly   Reason for referral: Evaluate elevated platelet count   HPI:  82 year old woman who moved here from [place] about 3 years ago. 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 She was diagnosed with multiple sclerosis at age 69. Main manifestation was a left foot drop. She was very functional until around the time she moved here. Her granddaughter who accompanies her today reports that she started to lose a significant amount of weight going from a size 12 down to a size 8. Last recorded weight prior to amputation of her left lower extremity in February 2017 was 101 pounds. Her weight has been stable over the last year. She continues to have an excellent appetite despite the weight loss. She is hypothyroid on replacement. On 02/27/2015 she developed transient dysarthria and left facial weakness. CT scan showed chronic atrophy and white matter microvascular ischemic changes. MRI showed a 7 mm acute cortical ischemic infarct in the right parietal lobe and an additional 7 mm focus of mildly increased signal intensity within bilateral corona radiata suspicious for acute or early subacute ischemic infarcts. Advanced cerebral atrophy with chronic microvascular ischemic disease noted. She is allergic to aspirin since she was a teenager. She was put on Plavix which she is tolerating. Platelet count at time of the stroke was 863,000. No prior values for comparison. Granddaughter reports that her memory began to decline significantly about 3 or 4 months prior to the stroke. She has peripheral vascular disease and hypertension. 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 Platelet count at time of the surgery was 1.1 million on  04/17/2015 with subsequent values as high as 1.4 million recently recorded on 07/10/2015. She is now showing ischemic changes of her right lower extremity. She has a 1 cm nonhealing ulcer on the heel of her right foot. She is being followed in the wound clinic. She is not diabetic. I do not find any x-ray studies done on the right side to rule out osteomyelitis. She has some shallow decubitus ulcers in the sacral area which granddaughter states are healing. With regard to oncologic history, granddaughter reports that she has had a nickel-sized lump on her vagina which has been stable over time. No ulceration or bleeding. In addition to the elevated platelet count she has a chronically elevated white count most recent value 12,000 on April 18. At time of her amputation in January white count of 17,000. Currently running in the 12-15,000 range. Last white count differential done 04/21/2015 with 83% neutrophils, 9 lymphocytes, 7 monocytes, 1 eosinophil. Hemoglobin at time of her stroke on 02/27/2015 was 14.7 with MCV 78. At time she presented for the leg amputation hemoglobin was 12.2 on 04/14/2015. Hemoglobin dropped as low as 8.9 after surgery. Most recent value back up to 12.7 with MCV 69. Calcium elevated at 11 with albumin 4.0 on April 18. Normal alkaline phosphatase. Oddly, a serum total protein was not reported out.      PMH: Past Medical History  Diagnosis Date  . Dementia   . Hypertension   . Stroke [facility])     Left sided weakness  . Multiple sclerosis ([place])   . Hyperlipidemia   . Hypothyroidism   . Acute on chronic kidney  failure, stage II - III ([place]) 04/13/2015  . Cellulitis and abscess of leg 04/13/2015  . Thrombocytosis ([place]) 04/13/2015  . Pressure ulcer of right heel, stage 4 ([place]) 04/15/2015  . Severe protein-calorie malnutrition [facility])     Past Surgical History  Procedure Laterality Date  . Total hip arthroplasty Right   . Total hip arthroplasty Left   . Coronary artery bypass  graft    . Amputation Left 04/25/2015    Procedure: AMPUTATION ABOVE KNEE;  Surgeon: Rosetta Posner, MD;  Location: [place];  Service: Vascular;  Laterality: Left;    Allergies: Allergies  Allergen Reactions  . Aspirin     Unknown reaction.     Medications:  Current outpatient prescriptions:  .  amLODipine (NORVASC) 5 MG tablet, Take 1 tablet (5 mg total) by mouth daily., Disp: 30 tablet, Rfl: 2 .  bisacodyl (DULCOLAX) 5 MG EC tablet, Take 1 tablet (5 mg total) by mouth daily as needed for moderate constipation., Disp: 30 tablet, Rfl: 0 .  Cholecalciferol (VITAMIN D3) 5000 UNITS CAPS, Take 5,000 Units by mouth daily., Disp: , Rfl:  .  clopidogrel (PLAVIX) 75 MG tablet, Take 1 tablet (75 mg total) by mouth daily., Disp: 30 tablet, Rfl: 6 .  feeding supplement, ENSURE ENLIVE, (ENSURE ENLIVE) LIQD, Take 237 mLs by mouth 2 (two) times daily between meals., Disp: 237 mL, Rfl: 12 .  ferrous sulfate 325 (65 FE) MG tablet, Take 1 tablet (325 mg total) by mouth daily with breakfast., Disp: 30 tablet, Rfl: 1 .  levothyroxine (SYNTHROID, LEVOTHROID) 25 MCG tablet, Take 1 tablet (25 mcg total) by mouth daily before breakfast., Disp: 30 tablet, Rfl: 0 .  Multiple Vitamin (MULTIVITAMIN WITH MINERALS) TABS tablet, Take 1 tablet by mouth daily., Disp: , Rfl:  .  pravastatin (PRAVACHOL) 20 MG tablet, Take 1 tablet (20 mg total) by mouth daily at 6 PM., Disp: 30 tablet, Rfl: 6 .  senna-docusate (SENOKOT-S) 8.6-50 MG tablet, Take 1 tablet by mouth at bedtime as needed for mild constipation., Disp: 30 tablet, Rfl: 0 .  Vitamins A & D (VITAMIN A & D) ointment, Apply 1 application topically as needed (with every adult brief change.)., Disp: , Rfl:   Social History: She is married and her husband is still alive at age 82. Her daughter died around the time she left [place]. Her granddaughter and great grandson are her main caregivers.  a she has never smoked..  she does not drink alcohol   Family History: Family  History  Problem Relation Age of Onset  . Obesity Other   . Heart attack Brother     Review of Systems: Most of the review of systems obtained from the patient's granddaughter. Patient interacts with her better than with anybody else. She was able to answer some questions for me. Due to dementing illness and poor memory I'm not sure that her answers were very accurate. She denied any significant headache or change in vision. She did admit to intermittent chest pain, dyspnea, and rapid heartbeat. She denied any change in bowel habit. No blood in the stool or urine. No vaginal bleeding. Daughter commented that she has a nickel sized lump on the vaginal wall when she washes her that has been there for a long time and is not changing. No ulceration or bleeding. Remaining ROS negative.  Physical Exam: Blood pressure 118/63, pulse 82, temperature 98 F (36.7 C), temperature source Oral, SpO2 99 %. Wt Readings from Last 3 Encounters:  05/22/15 101 lb (45.813 kg)  04/21/15 101 lb (45.813 kg)  04/14/15 101 lb 3.1 oz (45.9 kg)   General appearance: Cachectic African-American woman, very slow to respond but speech is fluent, she responds mostly to her granddaughter who accompanies her. HEENT: Pharynx no erythema, exudate, mass, or ulcer. No thyromegaly or thyroid nodules Lymph nodes: No cervical, supraclavicular, or axillary lymphadenopathy Breasts: No abnormal skin changes, no dominant mass in either breast; breasts are atrophied. Lungs: Clear to auscultation, resonant to percussion throughout Heart: Regular rhythm, 3/6 systolic murmur at the apex, 2/6 systolic murmur at the second right intercostal space.  no gallop, no rub, no click, no edema Abdomen: Soft, nontender, normal bowel sounds, no mass, no organomegaly Extremities: No edema, no calf tenderness Musculoskeletal: Above the knee amputation left leg. Stump well-healed. GU:  Vascular: Carotid pulses 2+, no bruits, distal pulses: Amputation  left lower extremity. Right foot is cool. Acyanotic. No dorsalis pedis or posterior tibial pulse palpable. Neurologic: Alert, oriented to person, slow mentation, poor memory, fluent speech, body twisted to the right, no gross facial asymmetry, PERRLA, optic discs not examined, cranial nerves grossly normal, motor strength 5 over 5, reflexes 1+ symmetric, upper body coordination not tested, gait not tested, patient is an amputee. Skin: No rash or ecchymosis. There is a 1 cm ulcer on the heel of the right foot with scant purulent exudate. No odor.    Lab Results: Lab Results  Component Value Date   WBC 11.9* 07/10/2015   HGB 8.9* 04/29/2015   HCT 41.2 07/10/2015   MCV 69* 07/10/2015   PLT 1433* 07/10/2015     Chemistry      Component Value Date/Time   NA 147* 07/10/2015 1141   NA 142 04/28/2015 0246   K 4.7 07/10/2015 1141   CL 104 07/10/2015 1141   CO2 22 07/10/2015 1141   BUN 38* 07/10/2015 1141   BUN 18 04/28/2015 0246   CREATININE 1.10* 07/10/2015 1141      Component Value Date/Time   CALCIUM 11.0* 07/10/2015 1141   ALKPHOS 82 07/10/2015 1141   AST 18 07/10/2015 1141   ALT 10 07/10/2015 1141   BILITOT <0.2 07/10/2015 1141   BILITOT 0.5 04/21/2015 1106         Review of peripheral blood film:Pending   Radiological Studies: No results found.    Impression and Plan: Chronic thrombocytosis and leukocytosis first noted at time of a stroke in December 2016. Progressive rise in the platelet count over the last 6 months which is now 1.4 million and average white count 15,000. She could certainly have a myeloproliferative disorder with the stroke is the first presenting sign. Alternative possibilities would include osteomyelitis with a deep nonhealing ulcer on the heel of her right foot. Occult malignancy in view of significant weight loss and unexplained hypercalcemia also a consideration. Myeloma would be in the differential. She has mild renal insufficiency. Negative  chest x-ray. Negative breast exam. Daughter reports a nodule on the vaginal wall. She may need a proper pelvic exam to confirm this. It may just be a Bartholin's cyst.  She has poor vascular access. Lab was not able to get the full panel of tests that I wanted today. I prioritized a JAK-2 gene analysis. This gene is mutated in about 50% of people with essential thrombocythemia. Negative result does not rule out a myeloproliferative disorder but additional gene studies can be done to bring the diagnostic yield up to about 90%. I'm repeating the serum calcium. Hopefully  this was a lab error. If not, she will need further evaluation. Alkaline phosphatase is normal so it does not appear to be related to bone metastases. she may benefit by getting an MRI of her right foot to rule out osteomyelitis. We will have to get iron studies and serum protein electrophoresis at a later date if indicated based on results above.  I reviewed the differential possibilities with the patient, her granddaughter and her great grandson. I will continue the Plavix for now. If we are able to diagnose a myeloproliferative disorder, I would put her on a platelet lowering drug, hydroxyurea.       Annia Belt, MD 09/17/2015, 5:16 PM

## 2015-09-17 NOTE — ED Provider Notes (Signed)
CSN: [id number]     Arrival date & time 09/17/15  1750 History   First MD Initiated Contact with Patient 09/17/15 1927     Chief Complaint  Patient presents with  . Chest Pain     (Consider location/radiation/quality/duration/timing/severity/associated sxs/prior Treatment) HPI Patient presents after an episode of unresponsiveness today lasting approximately 60 seconds. Agent was adding in the car with her granddaughter when she suddenly blankly stared ahead, no convulsions, no tongue biting, patient is incontinent of urine at baseline, no head trauma from this, no slurred speech, just staring blankly ahead with more pronounced left-sided droopiness (previous stroke with left-sided weakness). She woke up immediately from this and went back to her baseline complaining of chest pain. Patient is demented and is unable to give me any details of the episode today, she states she cannot remember. To triage, there was a report of shortness of breath the patient currently denies. Apparently, the chest pain did radiate to both arms and to her back however. Patient has had a recent echo last year with multiple valvular disorders but good ejection fraction. She is now back to her late baseline per her granddaughter. Of note, patient does have some wounds in her sacral and right heel no fevers no chills. He does have a history of thrombocytopenia and was recently evaluated at a physician's office today for this, it is unknown if she has cancer. Family is worried because her blood pressure is much lower than usual.  Past Medical History  Diagnosis Date  . Dementia   . Hypertension   . Stroke [facility])     Left sided weakness  . Multiple sclerosis ([place])   . Hyperlipidemia   . Hypothyroidism   . Cellulitis and abscess of leg 04/13/2015  . Thrombocytosis ([place]) 04/13/2015  . Pressure ulcer of right heel, stage 4 ([place]) 04/15/2015  . Severe protein-calorie malnutrition ([place])   . Acute on chronic kidney failure, stage  II - III ([place]) 04/13/2015   Past Surgical History  Procedure Laterality Date  . Total hip arthroplasty Right   . Total hip arthroplasty Left   . Coronary artery bypass graft    . Amputation Left 04/25/2015    Procedure: AMPUTATION ABOVE KNEE;  Surgeon: Rosetta Posner, MD;  Location: [facility] OR;  Service: Vascular;  Laterality: Left;   Family History  Problem Relation Age of Onset  . Obesity Other   . Heart attack Brother    Social History  Substance Use Topics  . Smoking status: Never Smoker   . Smokeless tobacco: Never Used  . Alcohol Use: No   OB History    No data available     Review of Systems  Unable to perform ROS: Dementia  Constitutional: Negative for fever.  Respiratory: Positive for shortness of breath. Negative for cough.   Cardiovascular: Positive for chest pain. Negative for leg swelling.  Genitourinary: Negative for dysuria and hematuria.  Skin: Positive for wound.  Allergic/Immunologic: Negative for immunocompromised state.  Neurological: Negative for tremors and headaches.      Allergies  Aspirin  Home Medications   Prior to Admission medications   Medication Sig Start Date End Date Taking? Authorizing Provider  acetaminophen (TYLENOL) 500 MG tablet Take 500-1,000 mg by mouth every 6 (six) hours as needed for mild pain, moderate pain or headache.   Yes Historical Provider, MD  amLODipine (NORVASC) 5 MG tablet Take 1 tablet (5 mg total) by mouth daily. 07/10/15  Yes Milagros Loll, MD  bisacodyl (  DULCOLAX) 5 MG EC tablet Take 1 tablet (5 mg total) by mouth daily as needed for moderate constipation. 04/17/15  Yes Theodis Blaze, MD  clopidogrel (PLAVIX) 75 MG tablet Take 1 tablet (75 mg total) by mouth daily. 07/10/15  Yes Milagros Loll, MD  feeding supplement, ENSURE ENLIVE, (ENSURE ENLIVE) LIQD Take 237 mLs by mouth 2 (two) times daily between meals. 04/29/15  Yes Belkys A Regalado, MD  levothyroxine (SYNTHROID, LEVOTHROID) 25 MCG tablet Take 1 tablet (25 mcg  total) by mouth daily before breakfast. 07/10/15  Yes Milagros Loll, MD  Multiple Vitamin (MULTIVITAMIN WITH MINERALS) TABS tablet Take 1 tablet by mouth daily.   Yes Historical Provider, MD  pravastatin (PRAVACHOL) 20 MG tablet Take 1 tablet (20 mg total) by mouth daily at 6 PM. 07/10/15  Yes Milagros Loll, MD  senna-docusate (SENOKOT-S) 8.6-50 MG tablet Take 1 tablet by mouth at bedtime as needed for mild constipation. 04/17/15  Yes Theodis Blaze, MD  Vitamins A & D (VITAMIN A & D) ointment Apply 1 application topically as needed (with every adult brief change.).   Yes Historical Provider, MD  ferrous sulfate 325 (65 FE) MG tablet Take 1 tablet (325 mg total) by mouth daily with breakfast. 07/11/15   Milagros Loll, MD   BP 104/58 mmHg  Pulse 68  Temp(Src) 99.5 F (37.5 C) (Rectal)  Resp 13  SpO2 99% Physical Exam  Constitutional: She appears well-developed and well-nourished. No distress.  HENT:  Head: Normocephalic and atraumatic.  Eyes: Conjunctivae are normal. Pupils are equal, round, and reactive to light. Right eye exhibits no discharge. Left eye exhibits no discharge.  Neck: Normal range of motion. Neck supple.  Cardiovascular: Normal rate and regular rhythm.   Murmur (systolic) heard. Pulmonary/Chest: Effort normal and breath sounds normal. No respiratory distress.  Abdominal: Soft. Bowel sounds are normal. She exhibits no distension and no mass. There is no tenderness. There is no rebound and no guarding.  Musculoskeletal: She exhibits no edema.  Neurological:  Somnolent but awakens to voice, left sided facial droop (family states baseline). Unable to ambulate or move on her own, left sided amputation of LLE, RLE with air boot and no swelling or tenderness. RLE 1/5 strength. Able to move upper extremities minimally.   Skin: Skin is warm. Rash (sacral decubitus rash stage 1,no surrounding purulence or drainage. Also wound without surroudning erythema at right heel. ) noted.   Psychiatric: She has a normal mood and affect.  Nursing note and vitals reviewed.   ED Course  Procedures (including critical care time) Labs Review Labs Reviewed  BASIC METABOLIC PANEL - Abnormal; Notable for the following:    Glucose, Bld 198 (*)    BUN 46 (*)    Creatinine, Ser 1.46 (*)    GFR calc non Af Amer 32 (*)    GFR calc Af Amer 37 (*)    All other components within normal limits  CBC - Abnormal; Notable for the following:    WBC 12.6 (*)    Hemoglobin 10.1 (*)    HCT 33.6 (*)    MCV 66.4 (*)    MCH 20.0 (*)    RDW 21.9 (*)    Platelets 1359 (*)    All other components within normal limits  URINALYSIS, ROUTINE W REFLEX MICROSCOPIC (NOT AT [facility]) - Abnormal; Notable for the following:    APPearance CLOUDY (*)    pH 8.5 (*)    Hgb urine dipstick TRACE (*)  Protein, ur 100 (*)    Leukocytes, UA MODERATE (*)    All other components within normal limits  D-DIMER, QUANTITATIVE (NOT AT [facility]) - Abnormal; Notable for the following:    D-Dimer, Quant 0.56 (*)    All other components within normal limits  URINE MICROSCOPIC-ADD ON - Abnormal; Notable for the following:    Squamous Epithelial / LPF TOO NUMEROUS TO COUNT (*)    Bacteria, UA MANY (*)    All other components within normal limits  URINE CULTURE  I-STAT TROPOININ, ED    Imaging Review Dg Chest 2 View  09/17/2015  CLINICAL DATA:  82 year old female with chest pain and altered mental status EXAM: CHEST  2 VIEW COMPARISON:  Chest radiograph dated 04/28/2015 FINDINGS: The patient is tilted and rotated to the left. There is no focal consolidation, pleural effusion, or pneumothorax. There is mild eventration of the left hemidiaphragm similar to prior study. Stable cardiac silhouette. No acute osseous pathology. IMPRESSION: No active cardiopulmonary disease. Electronically Signed   By: Anner Crete M.D.   On: 09/17/2015 20:21   Ct Head Wo Contrast  09/17/2015  CLINICAL DATA:  82 year old female with confusion  EXAM: CT HEAD WITHOUT CONTRAST TECHNIQUE: Contiguous axial images were obtained from the base of the skull through the vertex without intravenous contrast. COMPARISON:  Head CT dated 02/27/2015 and brain MRI dated 02/27/2015 FINDINGS: There is moderate age-related atrophy and chronic microvascular ischemic changes. There is no acute intracranial hemorrhage. No mass effect or midline shift noted. The visualized paranasal sinuses and mastoid air cells are clear. The calvarium is intact. IMPRESSION: No acute intracranial hemorrhage. Age-related atrophy and chronic microvascular ischemic disease. If symptoms persist and there are no contraindications, MRI may provide better evaluation if clinically indicated. Electronically Signed   By: Anner Crete M.D.   On: 09/17/2015 20:42   I have personally reviewed and evaluated these images and lab results as part of my medical decision-making.   EKG Interpretation   Date/Time:  Monday September 17 2015 17:57:14 EDT Ventricular Rate:  65 PR Interval:  140 QRS Duration: 74 QT Interval:  412 QTC Calculation: 428 R Axis:   17 Text Interpretation:  Normal sinus rhythm Cannot rule out Anterior infarct  , age undetermined Abnormal ECG since last tracing no significant change  Confirmed by MILLER  MD, BRIAN ([id number]) on 09/17/2015 7:55:53 PM      MDM   Final diagnoses:  Syncope and collapse    Patient appears to have had a syncopal event today with subsequent chest pain. Patient at right high risk for cardiac event given her age, risk factors and chest pain with radiation. However, EKG without signs of acute ischemia. Her initial troponin is negative. Labs do not reveal a obvious source for hypoactive delirium if that is the case. It is not entirely clear the patient actually syncopized, also possible the patient sees. However, patient did not have any postictal episode, did not bite her tongue, no recent head trauma, head CT negative, and no history of seizures.  D-dimer is elevated but age-adjusted and negative. Doubt PE Patient has had a echo in 2016 with multiple valvular abnormalities but normal ejection fraction but likely merits a second echo and telemetry monitoring. Will admit patient for further monitoring and evaluation. Patient family updated and in agreement with plan.    Karma Greaser, MD 09/17/15 [id number]  [name], MD 09/18/15 [phone]

## 2015-09-17 NOTE — H&P (Addendum)
TRH H&P   Patient Demographics:    Rachel [name], is a 82 y.o. female  MRN: [id number]   DOB - [birth year]  Admit Date - 09/17/2015  Outpatient Primary MD for the patient is Axel Filler, MD, prev Dr. Eather Colas or Dr. Randell Patient at Internal medicine clinic   Referring MD/NP/PA: Dorthula Rue  Outpatient Specialists:    Patient coming from: home  Chief Complaint  Patient presents with  . Chest Pain      HPI:    Rachel [name]  is a 82 y.o. female, w/ Dementia, CKD stage3, Hypertension, Hyperlipidemia, MS, Hypothyroidism apparently went to see Dr. Beryle Beams for thrombocytosis and had labs and then just got home and was still in the car when had possible syncope "not responsive per her daughter to voice"  Lasting about 1 minute. Then when she came back c/o chest pain.  Pt was not able to describe her symptoms and a poor historian.  Pt was driven to ER by her daughter.   In ED,   CT brain negative for any acute process.  Trop negative   CBC showed thrombocytosis,  Plt 1359.   Pt had mild renal insufficiency with creatinine 1.46.  And bs 198.   Pt will be admitted for w/up of syncope.     Review of systems:    In addition to the HPI above,  No Fever-chills, No Headache, No changes with Vision or hearing, No problems swallowing food or Liquids, No Chest pain, Cough or Shortness of Breath, No Abdominal pain, No Nausea or Vommitting, Bowel movements are regular, No Blood in stool or Urine, No dysuria, + right heel ulcer, No new joints pains-aches,  No new weakness, tingling, numbness in any extremity, No recent weight gain or loss, No polyuria, polydypsia or polyphagia, No significant Mental Stressors.  A full 10 point Review of Systems was done, except as stated above, all other Review of Systems were negative.   With Past History of the following :    Past  Medical History  Diagnosis Date  . Dementia   . Hypertension   . Stroke [facility])     Left sided weakness  . Multiple sclerosis ([place])   . Hyperlipidemia   . Hypothyroidism   . Cellulitis and abscess of leg 04/13/2015  . Thrombocytosis ([place]) 04/13/2015  . Pressure ulcer of right heel, stage 4 ([place]) 04/15/2015  . Severe protein-calorie malnutrition ([place])   . Acute on chronic kidney failure, stage II - III ([place]) 04/13/2015      Past Surgical History  Procedure Laterality Date  . Total hip arthroplasty Right   . Total hip arthroplasty Left   . Coronary artery bypass graft    . Amputation Left 04/25/2015    Procedure: AMPUTATION ABOVE KNEE;  Surgeon: Rosetta Posner, MD;  Location: [facility];  Service: Vascular;  Laterality: Left;  Social History:     Social History  Substance Use Topics  . Smoking status: Never Smoker   . Smokeless tobacco: Never Used  . Alcohol Use: No     Lives - at home  Mobility - unable to ambulate by self   Family History :     Family History  Problem Relation Age of Onset  . Obesity Other   . Heart attack Brother       Home Medications:   Prior to Admission medications   Medication Sig Start Date End Date Taking? Authorizing Provider  acetaminophen (TYLENOL) 500 MG tablet Take 500-1,000 mg by mouth every 6 (six) hours as needed for mild pain, moderate pain or headache.   Yes Historical Provider, MD  amLODipine (NORVASC) 5 MG tablet Take 1 tablet (5 mg total) by mouth daily. 07/10/15  Yes Milagros Loll, MD  bisacodyl (DULCOLAX) 5 MG EC tablet Take 1 tablet (5 mg total) by mouth daily as needed for moderate constipation. 04/17/15  Yes Theodis Blaze, MD  clopidogrel (PLAVIX) 75 MG tablet Take 1 tablet (75 mg total) by mouth daily. 07/10/15  Yes Milagros Loll, MD  feeding supplement, ENSURE ENLIVE, (ENSURE ENLIVE) LIQD Take 237 mLs by mouth 2 (two) times daily between meals. 04/29/15  Yes Belkys A Regalado, MD  levothyroxine (SYNTHROID, LEVOTHROID) 25  MCG tablet Take 1 tablet (25 mcg total) by mouth daily before breakfast. 07/10/15  Yes Milagros Loll, MD  Multiple Vitamin (MULTIVITAMIN WITH MINERALS) TABS tablet Take 1 tablet by mouth daily.   Yes Historical Provider, MD  pravastatin (PRAVACHOL) 20 MG tablet Take 1 tablet (20 mg total) by mouth daily at 6 PM. 07/10/15  Yes Milagros Loll, MD  senna-docusate (SENOKOT-S) 8.6-50 MG tablet Take 1 tablet by mouth at bedtime as needed for mild constipation. 04/17/15  Yes Theodis Blaze, MD  Vitamins A & D (VITAMIN A & D) ointment Apply 1 application topically as needed (with every adult brief change.).   Yes Historical Provider, MD  ferrous sulfate 325 (65 FE) MG tablet Take 1 tablet (325 mg total) by mouth daily with breakfast. 07/11/15   Milagros Loll, MD     Allergies:     Allergies  Allergen Reactions  . Aspirin Other (See Comments)    Reaction unknown, per granddaughter     Physical Exam:   Vitals  Blood pressure 104/58, pulse 68, temperature 99.5 F (37.5 C), temperature source Rectal, resp. rate 13, SpO2 99 %.   1. General  lying in bed in NAD,   2. Normal affect and insight, Not Suicidal or Homicidal, Awake Alert, Oriented X 3.  3. No F.N deficits, ALL C.Nerves Intact, Strength 5/5 all 4 extremities, Sensation intact all 4 extremities, Plantars down going.  4. Ears and Eyes appear Normal, Conjunctivae clear, PERRLA. Moist Oral Mucosa.  5. Supple Neck, No JVD, No cervical lymphadenopathy appriciated, No Carotid Bruits.  6. Symmetrical Chest wall movement, Good air movement bilaterally, CTAB.  7. RRR, S1, S2, 2/6 sem rusb.   No Parasternal Heave.  8. Positive Bowel Sounds, Abdomen Soft, No tenderness, No organomegaly appriciated,No rebound -guarding or rigidity.  9.  No Cyanosis, Normal Skin Turgor, No Skin Rash or Bruise. R heel ulcer   10. Good muscle tone,  joints appear normal , no effusions, Normal ROM.  11. No Palpable Lymph Nodes in Neck or Axillae      Data Review:    CBC  Recent Labs Lab 09/17/15  1640 09/17/15 1813  WBC 12.4* 12.6*  HGB 11.1* 10.1*  HCT 36.1 33.6*  PLT 1341* 1359*  MCV 66.7* 66.4*  MCH 20.5* 20.0*  MCHC 30.7 30.1  RDW 21.9* 21.9*  LYMPHSABS 2.2  --   MONOABS 0.6  --   EOSABS 0.4  --   BASOSABS 0.1  --    ------------------------------------------------------------------------------------------------------------------  Chemistries   Recent Labs Lab 09/17/15 1813  NA 140  K 3.6  CL 105  CO2 27  GLUCOSE 198*  BUN 46*  CREATININE 1.46*  CALCIUM 9.3   ------------------------------------------------------------------------------------------------------------------ CrCl cannot be calculated (Unknown ideal weight.). ------------------------------------------------------------------------------------------------------------------ No results for input(s): TSH, T4TOTAL, T3FREE, THYROIDAB in the last 72 hours.  Invalid input(s): FREET3  Coagulation profile No results for input(s): INR, PROTIME in the last 168 hours. -------------------------------------------------------------------------------------------------------------------  Recent Labs  09/17/15 2046  DDIMER 0.56*   -------------------------------------------------------------------------------------------------------------------  Cardiac Enzymes No results for input(s): CKMB, TROPONINI, MYOGLOBIN in the last 168 hours.  Invalid input(s): CK ------------------------------------------------------------------------------------------------------------------ No results found for: BNP   ---------------------------------------------------------------------------------------------------------------  Urinalysis    Component Value Date/Time   COLORURINE YELLOW 09/17/2015 2008   APPEARANCEUR CLOUDY* 09/17/2015 2008   LABSPEC 1.005 09/17/2015 2008   PHURINE 8.5* 09/17/2015 2008   GLUCOSEU NEGATIVE 09/17/2015 2008   HGBUR TRACE* 09/17/2015  2008   BILIRUBINUR NEGATIVE 09/17/2015 2008   KETONESUR NEGATIVE 09/17/2015 2008   PROTEINUR 100* 09/17/2015 2008   NITRITE NEGATIVE 09/17/2015 2008   LEUKOCYTESUR MODERATE* 09/17/2015 2008    ----------------------------------------------------------------------------------------------------------------   Imaging Results:    Dg Chest 2 View  09/17/2015  CLINICAL DATA:  82 year old female with chest pain and altered mental status EXAM: CHEST  2 VIEW COMPARISON:  Chest radiograph dated 04/28/2015 FINDINGS: The patient is tilted and rotated to the left. There is no focal consolidation, pleural effusion, or pneumothorax. There is mild eventration of the left hemidiaphragm similar to prior study. Stable cardiac silhouette. No acute osseous pathology. IMPRESSION: No active cardiopulmonary disease. Electronically Signed   By: Anner Crete M.D.   On: 09/17/2015 20:21   Ct Head Wo Contrast  09/17/2015  CLINICAL DATA:  82 year old female with confusion EXAM: CT HEAD WITHOUT CONTRAST TECHNIQUE: Contiguous axial images were obtained from the base of the skull through the vertex without intravenous contrast. COMPARISON:  Head CT dated 02/27/2015 and brain MRI dated 02/27/2015 FINDINGS: There is moderate age-related atrophy and chronic microvascular ischemic changes. There is no acute intracranial hemorrhage. No mass effect or midline shift noted. The visualized paranasal sinuses and mastoid air cells are clear. The calvarium is intact. IMPRESSION: No acute intracranial hemorrhage. Age-related atrophy and chronic microvascular ischemic disease. If symptoms persist and there are no contraindications, MRI may provide better evaluation if clinically indicated. Electronically Signed   By: Anner Crete M.D.   On: 09/17/2015 20:42    EKG nsr at 85, nl axis, no st-t changes c/w ischemia   Assessment & Plan:    Principal Problem:   Syncope Active Problems:   Dementia   Hypothyroidism   Thrombocytosis  (HCC)   Renal insufficiency   Anemia    1. Syncope Tele Trop i q6h x3 Check d dimer If positive then VQ scan r/o PE Check carotid ultrasound, check cardiac echo  2.  Thrombocytosis Check cbc in am Check ferritin, iron, tibc as iron deficiency can be a cause of thrombocytosis Consider oncology consultation  3.  Anemia Check hemeoccult stool Check ferritin, iron, tibc, b12,folate, tsh, spep, upep  4.  Ckd stage 3,  Check cmp in am  5. Hyperglycemia Likely diabetic Check hga1c fsbs ac and qhs, iss  6.  Hypothyroidism Check tsh  7. Cp Tele Trop i q6h x3 Echocardiogram  DVT Prophylaxis Heparin - SCDs   AM Labs Ordered, also please review Full Orders  Family Communication: Admission, patients condition and plan of care including tests being ordered have been discussed with the patient who indicate understanding and agree with the plan and Code Status.  Code Status  FULL CODE  Likely DC to  home  Condition GUARDED    Consults called:   Admission status: observation  Time spent in minutes : 45 minutes   Jani Gravel M.D on 09/17/2015 at 10:03 PM  Between 7am to 7pm - Pager - [phone]. After 7pm go to [email or web address] - password [facility]  [facility] - Office  [phone]

## 2015-09-17 NOTE — ED Notes (Signed)
Per Dr. [name], okay to CT scan patient in the am.

## 2015-09-17 NOTE — ED Notes (Signed)
Patient transported to X-ray 

## 2015-09-17 NOTE — Patient Instructions (Signed)
To lab today Please draw outstanding lab from previous order (JAK-2 analysis) Return visit 3-4 weeks

## 2015-09-17 NOTE — ED Notes (Signed)
Attempted report x1. 

## 2015-09-17 NOTE — ED Notes (Signed)
Pt presents to ED for sudden onset of left sided chest pain with radiation to both arms and her back.  Pt also has a hx of a CVA in December.  Pt's grandaughter sts she took her to see internal medicine today and when they got home pt stared off for approx 60 seconds without responding, then began to c/o CP.  Pt also sts SOB.

## 2015-09-18 ENCOUNTER — Encounter (HOSPITAL_COMMUNITY): Payer: Self-pay | Admitting: General Practice

## 2015-09-18 ENCOUNTER — Other Ambulatory Visit (HOSPITAL_COMMUNITY): Payer: Medicare HMO

## 2015-09-18 ENCOUNTER — Observation Stay (HOSPITAL_COMMUNITY): Payer: Medicare HMO

## 2015-09-18 DIAGNOSIS — S91301A Unspecified open wound, right foot, initial encounter: Secondary | ICD-10-CM | POA: Diagnosis not present

## 2015-09-18 DIAGNOSIS — L8990 Pressure ulcer of unspecified site, unspecified stage: Secondary | ICD-10-CM | POA: Diagnosis present

## 2015-09-18 LAB — GLUCOSE, CAPILLARY
GLUCOSE-CAPILLARY: 67 mg/dL (ref 65–99)
GLUCOSE-CAPILLARY: 71 mg/dL (ref 65–99)
GLUCOSE-CAPILLARY: 63 mg/dL — AB (ref 65–99)
GLUCOSE-CAPILLARY: 143 mg/dL — AB (ref 65–99)
Glucose-Capillary: 147 mg/dL — ABNORMAL HIGH (ref 65–99)

## 2015-09-18 LAB — URINALYSIS, ROUTINE W REFLEX MICROSCOPIC
BILIRUBIN URINE: NEGATIVE
Glucose, UA: NEGATIVE mg/dL
KETONES UR: NEGATIVE mg/dL
Nitrite: NEGATIVE
PROTEIN: NEGATIVE mg/dL
Specific Gravity, Urine: 1.013 (ref 1.005–1.030)
pH: 6 (ref 5.0–8.0)

## 2015-09-18 LAB — COMPREHENSIVE METABOLIC PANEL
ALBUMIN: 3.9 g/dL (ref 3.5–4.7)
ALK PHOS: 76 IU/L (ref 39–117)
ALK PHOS: 52 U/L (ref 38–126)
ALT: 11 IU/L (ref 0–32)
ALT: 17 U/L (ref 14–54)
AST: 18 IU/L (ref 0–40)
AST: 20 U/L (ref 15–41)
Albumin/Globulin Ratio: 1.3 (ref 1.2–2.2)
Albumin: 2.8 g/dL — ABNORMAL LOW (ref 3.5–5.0)
Anion gap: 6 (ref 5–15)
BUN / CREAT RATIO: 40 — AB (ref 12–28)
BUN: 44 mg/dL — ABNORMAL HIGH (ref 8–27)
BUN: 41 mg/dL — AB (ref 6–20)
CALCIUM: 9 mg/dL (ref 8.9–10.3)
CHLORIDE: 102 mmol/L (ref 96–106)
CHLORIDE: 110 mmol/L (ref 101–111)
CO2: 25 mmol/L (ref 18–29)
CO2: 27 mmol/L (ref 22–32)
CREATININE: 1.3 mg/dL — AB (ref 0.44–1.00)
Calcium: 10.4 mg/dL — ABNORMAL HIGH (ref 8.7–10.3)
Creatinine, Ser: 1.11 mg/dL — ABNORMAL HIGH (ref 0.57–1.00)
GFR calc Af Amer: 52 mL/min/{1.73_m2} — ABNORMAL LOW (ref >59)
GFR calc non Af Amer: 45 mL/min/{1.73_m2} — ABNORMAL LOW (ref >59)
GFR calc non Af Amer: 36 mL/min — ABNORMAL LOW (ref >60)
GFR, EST AFRICAN AMERICAN: 42 mL/min — AB (ref >60)
GLOBULIN, TOTAL: 3 g/dL (ref 1.5–4.5)
GLUCOSE: 136 mg/dL — AB (ref 65–99)
GLUCOSE: 97 mg/dL (ref 65–99)
POTASSIUM: 4.8 mmol/L (ref 3.5–5.2)
Potassium: 3.4 mmol/L — ABNORMAL LOW (ref 3.5–5.1)
SODIUM: 147 mmol/L — AB (ref 134–144)
SODIUM: 143 mmol/L (ref 135–145)
Total Bilirubin: 0.4 mg/dL (ref 0.3–1.2)
Total Protein: 6.9 g/dL (ref 6.0–8.5)
Total Protein: 5.6 g/dL — ABNORMAL LOW (ref 6.5–8.1)

## 2015-09-18 LAB — URINE MICROSCOPIC-ADD ON

## 2015-09-18 LAB — CBC
HCT: 28.9 % — ABNORMAL LOW (ref 36.0–46.0)
Hemoglobin: 8.7 g/dL — ABNORMAL LOW (ref 12.0–15.0)
MCH: 19.7 pg — AB (ref 26.0–34.0)
MCHC: 30.1 g/dL (ref 30.0–36.0)
MCV: 65.4 fL — AB (ref 78.0–100.0)
PLATELETS: 1100 10*3/uL — AB (ref 150–400)
RBC: 4.42 MIL/uL (ref 3.87–5.11)
RDW: 21.4 % — AB (ref 11.5–15.5)
WBC: 13.6 10*3/uL — ABNORMAL HIGH (ref 4.0–10.5)

## 2015-09-18 LAB — IRON AND TIBC
Iron: 15 ug/dL — ABNORMAL LOW (ref 28–170)
Saturation Ratios: 4 % — ABNORMAL LOW (ref 10.4–31.8)
TIBC: 347 ug/dL (ref 250–450)
UIBC: 332 ug/dL

## 2015-09-18 LAB — D-DIMER, QUANTITATIVE (NOT AT ARMC): D DIMER QUANT: 0.47 ug{FEU}/mL (ref 0.00–0.50)

## 2015-09-18 LAB — TSH: TSH: 11.93 u[IU]/mL — ABNORMAL HIGH (ref 0.350–4.500)

## 2015-09-18 LAB — LACTATE DEHYDROGENASE: LDH: 266 IU/L — AB (ref 119–226)

## 2015-09-18 LAB — FERRITIN: Ferritin: 11 ng/mL (ref 11–307)

## 2015-09-18 MED ORDER — LEVOTHYROXINE SODIUM 25 MCG PO TABS
25.0000 ug | ORAL_TABLET | Freq: Every day | ORAL | Status: DC
  Administered 2015-09-18 – 2015-09-19 (×2): 25 ug via ORAL
  Filled 2015-09-18 (×2): qty 1

## 2015-09-18 MED ORDER — INSULIN ASPART 100 UNIT/ML ~~LOC~~ SOLN: 0.0000 [IU] | Freq: Three times a day (TID) | SUBCUTANEOUS | Status: DC

## 2015-09-18 MED ORDER — COLLAGENASE 250 UNIT/GM EX OINT
TOPICAL_OINTMENT | Freq: Every day | CUTANEOUS | Status: DC
  Administered 2015-09-18: 1 via TOPICAL
  Administered 2015-09-19: 17:00:00 via TOPICAL
  Filled 2015-09-18: qty 30

## 2015-09-18 MED ORDER — PRAVASTATIN SODIUM 20 MG PO TABS
20.0000 mg | ORAL_TABLET | Freq: Every day | ORAL | Status: DC
  Administered 2015-09-18 – 2015-09-19 (×2): 20 mg via ORAL
  Filled 2015-09-18 (×2): qty 1

## 2015-09-18 MED ORDER — ACETAMINOPHEN 650 MG RE SUPP: 650.0000 mg | Freq: Four times a day (QID) | RECTAL | Status: DC | PRN

## 2015-09-18 MED ORDER — ENSURE ENLIVE PO LIQD
237.0000 mL | Freq: Two times a day (BID) | ORAL | Status: DC
  Administered 2015-09-18 (×2): 237 mL via ORAL

## 2015-09-18 MED ORDER — FERROUS SULFATE 325 (65 FE) MG PO TABS
325.0000 mg | ORAL_TABLET | Freq: Every day | ORAL | Status: DC
  Administered 2015-09-18 – 2015-09-19 (×2): 325 mg via ORAL
  Filled 2015-09-18 (×2): qty 1

## 2015-09-18 MED ORDER — BISACODYL 5 MG PO TBEC: 5.0000 mg | DELAYED_RELEASE_TABLET | Freq: Every day | ORAL | Status: DC | PRN

## 2015-09-18 MED ORDER — HEPARIN SODIUM (PORCINE) 5000 UNIT/ML IJ SOLN
5000.0000 [IU] | Freq: Three times a day (TID) | INTRAMUSCULAR | Status: DC
  Administered 2015-09-18 – 2015-09-19 (×5): 5000 [IU] via SUBCUTANEOUS
  Filled 2015-09-18 (×5): qty 1

## 2015-09-18 MED ORDER — SODIUM CHLORIDE 0.9% FLUSH
3.0000 mL | Freq: Two times a day (BID) | INTRAVENOUS | Status: DC
  Administered 2015-09-18: 3 mL via INTRAVENOUS

## 2015-09-18 MED ORDER — CLOPIDOGREL BISULFATE 75 MG PO TABS
75.0000 mg | ORAL_TABLET | Freq: Every day | ORAL | Status: DC
  Administered 2015-09-18 – 2015-09-19 (×2): 75 mg via ORAL
  Filled 2015-09-18 (×2): qty 1

## 2015-09-18 MED ORDER — AMLODIPINE BESYLATE 5 MG PO TABS
5.0000 mg | ORAL_TABLET | Freq: Every day | ORAL | Status: DC
  Administered 2015-09-18: 5 mg via ORAL
  Filled 2015-09-18: qty 1

## 2015-09-18 MED ORDER — SODIUM CHLORIDE 0.9 % IV SOLN
INTRAVENOUS | Status: DC
  Administered 2015-09-18: 01:00:00 via INTRAVENOUS

## 2015-09-18 MED ORDER — SODIUM CHLORIDE 0.9 % IV SOLN
INTRAVENOUS | Status: DC
  Administered 2015-09-18: 14:00:00 via INTRAVENOUS

## 2015-09-18 MED ORDER — ADULT MULTIVITAMIN W/MINERALS CH
1.0000 | ORAL_TABLET | Freq: Every day | ORAL | Status: DC
  Administered 2015-09-18 – 2015-09-19 (×2): 1 via ORAL
  Filled 2015-09-18 (×2): qty 1

## 2015-09-18 MED ORDER — SENNOSIDES-DOCUSATE SODIUM 8.6-50 MG PO TABS: 1.0000 | ORAL_TABLET | Freq: Every evening | ORAL | Status: DC | PRN

## 2015-09-18 MED ORDER — ACETAMINOPHEN 325 MG PO TABS: 650.0000 mg | ORAL_TABLET | Freq: Four times a day (QID) | ORAL | Status: DC | PRN

## 2015-09-18 MED ORDER — INSULIN ASPART 100 UNIT/ML ~~LOC~~ SOLN: 0.0000 [IU] | Freq: Every day | SUBCUTANEOUS | Status: DC

## 2015-09-18 NOTE — Care Management Note (Signed)
Case Management Note Marvetta Gibbons RN, BSN Unit 2W-Case Manager [phone]  Patient Details  Name: Rachel [name] MRN: [id number] Date of Birth: Jul 14, 1927  Subjective/Objective:    Pt admitted with syncope                Action/Plan: PTA pt lived at home with granddaughter- is active with [facility]- per granddaughter-[name]- pt has all needed DME at home that includes, hospital bed, w/c, shower chair. Plan is to return home with family and resume HHRN- with [place]. Per granddaughter pt will transport home via private vehicle. CM to follow for any further needs.  Expected Discharge Date:                  Expected Discharge Plan:  [place]  In-House Referral:     Discharge planning Services  CM Consult  Post Acute Care Choice:  Resumption of Svcs/PTA Provider, Home Health Choice offered to:     DME Arranged:    DME Agency:     HH Arranged:    [place] Agency:  [place]  Status of Service:  In process, will continue to follow  If discussed at Long Length of Stay Meetings, dates discussed:    Additional Comments:  [name], RN 09/18/2015, 2:39 PM

## 2015-09-18 NOTE — Progress Notes (Addendum)
Subjective:   Rachel [name] is a patient of the internal medicine center but was admitted overnight by the hospitalist service for syncope.  The internal medicine teaching service will resume care today.  Contact information can be found on the sticky note.    Pt sleeping this AM but arousable.  She has an episode of sinus brady of 37 at 3AM but rate came back up.  She does complain of "stomach pain" this AM upon questioning.  CBG 67 this AM.  She has severe dementia and unfortunately, her granddaughter had jury duty today and was unavailable.    Objective:   Vital signs in last 24 hours: Filed Vitals:   09/17/15 2345 09/18/15 0017 09/18/15 0513 09/18/15 0947  BP: 122/67 118/58 130/78 121/58  Pulse: 55 60 83   Temp:  98.3 F (36.8 C) 97.7 F (36.5 C)   TempSrc:  Oral Oral   Resp: 14 20 20    Weight:  93 lb (42.185 kg) 101 lb 11.2 oz (46.131 kg)   SpO2: 100% 98% 98%     Weight: Filed Weights   09/18/15 0017 09/18/15 0513  Weight: 93 lb (42.185 kg) 101 lb 11.2 oz (46.131 kg)    I/Os:  Intake/Output Summary (Last 24 hours) at 09/18/15 1104 Last data filed at 09/18/15 0647  Gross per 24 hour  Intake    240 ml  Output      0 ml  Net    240 ml    Physical Exam: Constitutional: Vital signs reviewed.  Patient is sleeping in bed in bed in no acute distress.  HEENT: [place]/AT; EOMI, conjunctivae normal, no scleral icterus  Cardiovascular: RRR. Pulmonary/Chest: Normal respiratory effort, no accessory muscle use, CTA.  Abdominal: Soft. +BS, NT/ND Neurological: A&O x1.  Ext: L BKA stump.   Skin: Warm, dry. Areas of skin breakdown in the sacral area; right heel ulcer with some drainage.    Lab Results:  BMP:  Recent Labs Lab 09/17/15 1813 09/18/15 0131  NA 140 143  K 3.6 3.4*  CL 105 110  CO2 27 27  GLUCOSE 198* 97  BUN 46* 41*  CREATININE 1.46* 1.30*  CALCIUM 9.3 9.0    CBC:  Recent Labs Lab 09/17/15 1640 09/17/15 1813 09/18/15 0131  WBC 12.4* 12.6* 13.6*   NEUTROABS 9.1*  --   --   HGB 11.1* 10.1* 8.7*  HCT 36.1 33.6* 28.9*  MCV 66.7* 66.4* 65.4*  PLT 1341* 1359* 1100*    Coagulation: No results for input(s): LABPROT, INR in the last 168 hours.  CBG:            Recent Labs Lab 09/18/15 0035 09/18/15 0619  GLUCAP 147* 67           HA1C:      No results for input(s): HGBA1C in the last 168 hours.  Lipid Panel: No results for input(s): CHOL, HDL, LDLCALC, TRIG, CHOLHDL, LDLDIRECT in the last 168 hours.  LFTs:  Recent Labs Lab 09/17/15 1640 09/18/15 0131  AST 18 20  ALT 11 17  ALKPHOS 76 52  BILITOT <0.2 0.4  PROT 6.9 5.6*  ALBUMIN 3.9 2.8*    Pancreatic Enzymes: No results for input(s): LIPASE, AMYLASE in the last 168 hours.  Lactic Acid/Procalcitonin: No results for input(s): LATICACIDVEN, PROCALCITON in the last 168 hours.  Ammonia: No results for input(s): AMMONIA in the last 168 hours.  Cardiac Enzymes: No results for input(s): CKTOTAL, CKMB, CKMBINDEX, TROPONINI in the last 168 hours.  EKG: EKG Interpretation  Date/Time:  Monday September 17 2015 17:57:14 EDT Ventricular Rate:  65 PR Interval:  140 QRS Duration: 74 QT Interval:  412 QTC Calculation: 428 R Axis:   17 Text Interpretation:  Normal sinus rhythm Cannot rule out Anterior infarct , age undetermined Abnormal ECG since last tracing no significant change Confirmed by MILLER  MD, BRIAN ([id number]) on 09/17/2015 7:55:53 PM   BNP: No results for input(s): PROBNP in the last 168 hours.  D-Dimer:  Recent Labs Lab 09/17/15 2046 09/18/15 0136  DDIMER 0.56* 0.47    Urinalysis:  Recent Labs Lab 09/17/15 2008  COLORURINE YELLOW  LABSPEC 1.005  PHURINE 8.5*  GLUCOSEU NEGATIVE  HGBUR TRACE*  BILIRUBINUR NEGATIVE  KETONESUR NEGATIVE  PROTEINUR 100*  NITRITE NEGATIVE  LEUKOCYTESUR MODERATE*    Micro Results: No results found for this or any previous visit (from the past 240 hour(s)).  Blood Culture:    Component Value Date/Time    SDES BLOOD LEFT ARM 04/13/2015 2255   SPECREQUEST 5 CC AEROBIC BOTTLE ONLY 04/13/2015 2255   CULT  04/13/2015 2255    NO GROWTH 5 DAYS Performed at [facility] 04/19/2015 FINAL 04/13/2015 2255    Studies/Results: Dg Chest 2 View  09/17/2015  CLINICAL DATA:  82 year old female with chest pain and altered mental status EXAM: CHEST  2 VIEW COMPARISON:  Chest radiograph dated 04/28/2015 FINDINGS: The patient is tilted and rotated to the left. There is no focal consolidation, pleural effusion, or pneumothorax. There is mild eventration of the left hemidiaphragm similar to prior study. Stable cardiac silhouette. No acute osseous pathology. IMPRESSION: No active cardiopulmonary disease. Electronically Signed   By: Anner Crete M.D.   On: 09/17/2015 20:21   Ct Head Wo Contrast  09/17/2015  CLINICAL DATA:  82 year old female with confusion EXAM: CT HEAD WITHOUT CONTRAST TECHNIQUE: Contiguous axial images were obtained from the base of the skull through the vertex without intravenous contrast. COMPARISON:  Head CT dated 02/27/2015 and brain MRI dated 02/27/2015 FINDINGS: There is moderate age-related atrophy and chronic microvascular ischemic changes. There is no acute intracranial hemorrhage. No mass effect or midline shift noted. The visualized paranasal sinuses and mastoid air cells are clear. The calvarium is intact. IMPRESSION: No acute intracranial hemorrhage. Age-related atrophy and chronic microvascular ischemic disease. If symptoms persist and there are no contraindications, MRI may provide better evaluation if clinically indicated. Electronically Signed   By: Anner Crete M.D.   On: 09/17/2015 20:42    Medications:  Scheduled Meds: . clopidogrel  75 mg Oral Daily  . feeding supplement (ENSURE ENLIVE)  237 mL Oral BID BM  . ferrous sulfate  325 mg Oral Q breakfast  . heparin  5,000 Units Subcutaneous Q8H  . insulin aspart  0-5 Units Subcutaneous QHS  . insulin  aspart  0-9 Units Subcutaneous TID WC  . levothyroxine  25 mcg Oral QAC breakfast  . multivitamin with minerals  1 tablet Oral Daily  . pravastatin  20 mg Oral q1800  . sodium chloride flush  3 mL Intravenous Q12H   Continuous Infusions: . sodium chloride     PRN Meds: acetaminophen **OR** acetaminophen, bisacodyl, senna-docusate  Antibiotics: Antibiotics Given (last 72 hours)    None      Day of Hospitalization:   Consults:    Assessment/Plan:   Principal Problem:   Syncope Active Problems:   Dementia   Hypothyroidism   Thrombocytosis (HCC)   Renal insufficiency   Anemia  Pressure ulcer  Syncope Unclear etiology.  EKG without acute changes. POC-trop neg.  Reviewed telemetry and HR dropped to 37 at 3AM.  Had event monitor in February negative for afib.  Echo in December with LVEF 56-70% with normal wall motion, mild DD, AV severely calcified.  D-dimer slightly elevated yesterday at 0.56 but returned to normal this AM.  Doubt PE given no tachypnea, tachycardia, and normal SpO2.  UA was collected but will need to repeat given many squames in setting of complaining of abd pain.  CXR without active disease.  CT head without acute findings.  Unfortunately it doesn't appear that orthostatics were checked. CBG normal.  -orthostatic VS -cancel V/Q scan, unable to consent today and doubt PE -repeat UA -cont to monitor on tele -repeat echo pending  -cont IVF  -d/c amlodipine, BP stable  -d/c SSI, cbg 67 this AM   HTN -per above   R heel ulcer/pressure ulcers -XR right heel, may need MRI as concern for osteomyelitis  -wound care   Hypothyroidism TSH elevated which could be contributing to her bradycardia.  Unclear if she was compliant? -cont levothyroxine   Thrombocytosis Workup in progress, SPEP and [name] pending.  -monitor   Acute on CKD -NS 63ml/h  Deconditioning -PT/OT consult  F/E/N Fluids- None  Electrolytes- Replete as needed  Nutrition- HH/Carb mod  diet   VTE PPx  SCDs  Disposition Disposition is deferred, awaiting improvement of current medical problems.  Anticipated discharge in approximately 1-2 day(s).      Jones Bales, MD PGY-3, Internal Medicine Teaching Service 09/18/2015, 11:04 AM

## 2015-09-18 NOTE — Consult Note (Addendum)
WOC wound consult note Reason for Consult: Consult requested for buttocks, sacrum, and right heel. Granddaughter has been caring for the patient at home and states the wounds to sacrum and buttocks have healed, but will sometimes re-open, since patient is frequently incontinent.  Right heel is a chronic wound that has been followed by the outpatient wound care center prior to admission, and they were using what sounds like a silver dressing. Grandaughter states the wound has recently declined.  Wound type: Sacrum, left and right buttocks with lighter-colored sacr tissue intact from previous pressure injuries which have healed at this time; no open wounds or drainage. No topical treatment indicated; protect from further injury and repel moisture with barrier cream, since patient is frequently incontinent.  Right heel with deep tissue injury; 5X5cm darker-colored skin surrounding open wound in the center; .2X.2X.2cm, dark brown wound bed with small amt tan drainage, no odor.  Wound is a stage 4 pressure injury since bone is palpable when probed with a swab. There are also deep tissue injuries to anterior foot; 2X2cm, outer foot .2X.2cm, .3X.2cm, .3X.3cm and inner foot; 1X1cm Pressure Ulcer POA: Yes Dressing procedure/placement/frequency: Santyl ointment to provide enzymatic debridement to right heel.  Pt can follow-up with the outpatient wound care center after discharge. She has been wearing a Prevalon boot prior to admission for pressure reduction and it is currently in place. Pt had a decreased ABI in January which indicated a severe reduction in arterial flow at rest; this will impair healing. Discussed plan of care with grandaughter at the bedside. Please re-consult if further assistance is needed.  Thank-you,  Julien Girt MSN, [place], [place], [place], [place]

## 2015-09-18 NOTE — Progress Notes (Signed)
Initial Nutrition Assessment  DOCUMENTATION CODES:   Severe malnutrition in context of chronic illness, Underweight  INTERVENTION:   Continue Ensure Enlive po BID, each supplement provides 350 kcal and 20 grams of protein   Continue multivitamin with minerals daily  NUTRITION DIAGNOSIS:   Malnutrition related to chronic illness as evidenced by severe depletion of body fat, severe depletion of muscle mass  GOAL:   Patient will meet greater than or equal to 90% of their needs  MONITOR:   PO intake, Supplement acceptance, Labs, Weight trends, Skin, I & O's  REASON FOR ASSESSMENT:   Low [place]  ASSESSMENT:   82 y.o. Female, w/dementia, CKD stage3, HTN, hyperlipidemia, MS, hypothyroidism apparently went to see Dr. Beryle Beams for thrombocytosis and had labs and then just got home and was still in the car when had possible syncope "not responsive per her daughter to voice" Lasting about 1 minute. Then when she came back c/o chest pain. Pt was not able to describe her symptoms and a poor historian. Pt was driven to ER by her daughter.   RD unable to enter room at time of visit >> Nurse Tech saying I can't come in. Pt seen per Clinical Nutrition during during previous admission >> dx with malnutrition which is ongoing. PO intake 100% per flowsheet records. Would benefit from oral nutrition supplements >> will order. Unable to complete Nutrition-Focused physical exam at this time.   Diet Order:  Diet heart healthy/carb modified Room service appropriate?: Yes; Fluid consistency:: Thin  Skin:     Sacrum, left and right buttocks intact tissue from previous pressure injuries which have healed at this time Right heel with deep tissue injury Deep tissue injuries to anterior foot  Last BM:  N/A  CBG (last 3)   Recent Labs  09/18/15 0035 09/18/15 0619 09/18/15 1117  GLUCAP 147* 67 71    Height:   Ht Readings from Last 1 Encounters:  09/18/15 5\' 4"  (1.626 m)     Weight:   Wt Readings from Last 1 Encounters:  09/18/15 101 lb 11.2 oz (46.131 kg)    Ideal Body Weight:  54.5 kg  BMI:  Body mass index is 17.45 kg/(m^2).  Estimated Nutritional Needs:   Kcal:  1200-1400  Protein:  65-75 gm  Fluid:  >/= 1.5 L  EDUCATION NEEDS:   No education needs identified at this time  Arthur Holms, RD, LDN Pager #: [phone] After-Hours Pager #: [phone]

## 2015-09-18 NOTE — Care Management Obs Status (Signed)
[place] NOTIFICATION   Patient Details  Name: Rachel [name] MRN: [id number] Date of Birth: April 05, 1927   Medicare Observation Status Notification Given:  Yes    [name], RN 09/18/2015, 2:26 PM

## 2015-09-18 NOTE — Progress Notes (Signed)
In room with patient and ccm call to tell me [employer] and then back up to 50-60 range. Patient voice no complaints. Cont. To monitor patient and rhythm

## 2015-09-19 ENCOUNTER — Other Ambulatory Visit (HOSPITAL_COMMUNITY): Payer: Medicare HMO

## 2015-09-19 ENCOUNTER — Observation Stay (HOSPITAL_BASED_OUTPATIENT_CLINIC_OR_DEPARTMENT_OTHER): Payer: Medicare HMO

## 2015-09-19 DIAGNOSIS — R55 Syncope and collapse: Secondary | ICD-10-CM

## 2015-09-19 LAB — VAS US CAROTID
LEFT ECA DIAS: -2 cm/s
LEFT VERTEBRAL DIAS: -7 cm/s
Left CCA dist dias: -7 cm/s
Left CCA dist sys: -51 cm/s
Left CCA prox dias: -13 cm/s
Left CCA prox sys: -57 cm/s
Left ICA dist dias: -11 cm/s
Left ICA dist sys: -80 cm/s
Left ICA prox dias: -16 cm/s
Left ICA prox sys: -67 cm/s
RCCADSYS: -90 cm/s
RCCAPDIAS: 7 cm/s
RIGHT ECA DIAS: 11 cm/s
RIGHT VERTEBRAL DIAS: -16 cm/s
Right CCA prox sys: 48 cm/s

## 2015-09-19 LAB — URINE CULTURE

## 2015-09-19 LAB — ECHOCARDIOGRAM COMPLETE
AV Mean grad: 21 mmHg
AV pk vel: 334 cm/s
AVPG: 45 mmHg
AVPHT: 452 ms
CHL CUP MV DEC (S): 398
CHL CUP TV REG PEAK VELOCITY: 305 cm/s
DOP CAL AO MEAN VELOCITY: 212 cm/s
EWDT: 398 ms
FS: 38 % (ref 28–44)
Height: 64 in
IVS/LV PW RATIO, ED: 1.33
LA diam end sys: 30 mm
LA diam index: 2.03 cm/m2
LA vol A4C: 43.4 ml
LASIZE: 30 mm
LAVOL: 44.7 mL
LAVOLIN: 30.2 mL/m2
LDCA: 2.54 cm2
LV PW d: 12 mm — AB (ref 0.6–1.1)
LV TDI E'LATERAL: 6.97
LVELAT: 6.97 cm/s
LVOTD: 18 mm
MVPKEVEL: 0.5 m/s
TDI e' medial: 3.41
TRMAXVEL: 305 cm/s
VTI: 59.6 cm
Weight: 1665.6 oz

## 2015-09-19 LAB — PROTEIN ELECTROPHORESIS, SERUM
A/G RATIO SPE: 1.1 (ref 0.7–1.7)
Albumin ELP: 3 g/dL (ref 2.9–4.4)
Alpha-1-Globulin: 0.2 g/dL (ref 0.0–0.4)
Alpha-2-Globulin: 0.5 g/dL (ref 0.4–1.0)
BETA GLOBULIN: 0.8 g/dL (ref 0.7–1.3)
GAMMA GLOBULIN: 1.2 g/dL (ref 0.4–1.8)
Globulin, Total: 2.7 g/dL (ref 2.2–3.9)
Total Protein ELP: 5.7 g/dL — ABNORMAL LOW (ref 6.0–8.5)

## 2015-09-19 LAB — CBC
HEMATOCRIT: 29.8 % — AB (ref 36.0–46.0)
Hemoglobin: 8.8 g/dL — ABNORMAL LOW (ref 12.0–15.0)
MCH: 19.4 pg — ABNORMAL LOW (ref 26.0–34.0)
MCHC: 29.5 g/dL — AB (ref 30.0–36.0)
MCV: 65.8 fL — AB (ref 78.0–100.0)
PLATELETS: 1232 10*3/uL — AB (ref 150–400)
RBC: 4.53 MIL/uL (ref 3.87–5.11)
RDW: 22.2 % — AB (ref 11.5–15.5)
WBC: 12.2 10*3/uL — AB (ref 4.0–10.5)

## 2015-09-19 LAB — BASIC METABOLIC PANEL
Anion gap: 7 (ref 5–15)
BUN: 37 mg/dL — AB (ref 6–20)
CO2: 25 mmol/L (ref 22–32)
CREATININE: 1.12 mg/dL — AB (ref 0.44–1.00)
Calcium: 8.6 mg/dL — ABNORMAL LOW (ref 8.9–10.3)
Chloride: 108 mmol/L (ref 101–111)
GFR calc Af Amer: 50 mL/min — ABNORMAL LOW (ref >60)
GFR, EST NON AFRICAN AMERICAN: 44 mL/min — AB (ref >60)
GLUCOSE: 94 mg/dL (ref 65–99)
POTASSIUM: 3.8 mmol/L (ref 3.5–5.1)
SODIUM: 140 mmol/L (ref 135–145)

## 2015-09-19 LAB — HEMOGLOBIN A1C
Hgb A1c MFr Bld: 5.1 % (ref 4.8–5.6)
Mean Plasma Glucose: 100 mg/dL

## 2015-09-19 LAB — GLUCOSE, CAPILLARY
GLUCOSE-CAPILLARY: 93 mg/dL (ref 65–99)
GLUCOSE-CAPILLARY: 112 mg/dL — AB (ref 65–99)
Glucose-Capillary: 93 mg/dL (ref 65–99)

## 2015-09-19 LAB — PATHOLOGIST SMEAR REVIEW

## 2015-09-19 MED ORDER — COLLAGENASE 250 UNIT/GM EX OINT: TOPICAL_OINTMENT | Freq: Every day | CUTANEOUS | Status: AC

## 2015-09-19 NOTE — Progress Notes (Signed)
VASCULAR LAB PRELIMINARY  PRELIMINARY  PRELIMINARY  PRELIMINARY  Carotid duplex has been completed.     Bilateral:  1-39% ICA stenosis.  Vertebral artery flow is antegrade.     Rachel [name], RVT, RDMS 09/19/2015, 11:15 AM

## 2015-09-19 NOTE — Progress Notes (Signed)
  Echocardiogram 2D Echocardiogram has been performed.  Diamond Nickel 09/19/2015, 2:54 PM

## 2015-09-19 NOTE — Progress Notes (Signed)
Occupational Therapy Evaluation/Discharge Patient Details Name: Rachel [name] MRN: [id number] DOB: May 30, 1927 Today's Date: 09/19/2015    History of Present Illness Rachel [name] is a 82 y.o. female, w/ Dementia, CKD stage3, Hypertension, Hyperlipidemia, MS, Hypothyroidism, recent AKA apparently went to see Dr. Beryle Beams for thrombocytosis and had labs and then just got home and was still in the car when had possible syncope "not responsive per her daughter to voice" Lasting about 1 minute. Then when she came back c/o chest pain.   Clinical Impression   PTA, pt was total assist for all ADLs at bed level except pt able to eat with setup assist and pt's granddaughter provides total assist for all functional transfers. Patient's granddaughter reports that she is at her baseline. Feel daughter would benefit greatly from a hoyer lift for home. Pt has no further acute OT needs at this time. However, pt would benefit from nursing assist for OOB to chair with mechanical lift as well as pressure redistribution pad under pt during hospital stay.     Follow Up Recommendations  No OT follow up    Equipment Recommendations  Other (comment) (hoyer lift)    Recommendations for Other Services       Precautions / Restrictions Precautions Precautions: Fall Restrictions Weight Bearing Restrictions: No      Mobility Bed Mobility Overal bed mobility: Needs Assistance Bed Mobility: Supine to Sit     Supine to sit: +2 for physical assistance;Max assist     General bed mobility comments: one assist to lift trunk, another for moving hips/legs to EOB  Transfers Overall transfer level: Needs assistance   Transfers: Squat Pivot Transfers     Squat pivot transfers: Total assist     General transfer comment: pt not able to self support with R LE and needs total A to transfer    Balance Overall balance assessment: Needs assistance Sitting-balance support: Single extremity  supported Sitting balance-Leahy Scale: Poor Sitting balance - Comments: at least mod support for sitting balance leans posterior and to L Postural control: Left lateral lean                                  ADL Overall ADL's : At baseline                                       General ADL Comments: Total assist for all ADLs, set up and supervision for eating.      Vision     Perception     Praxis      Pertinent Vitals/Pain Pain Assessment: No/denies pain     Hand Dominance     Extremity/Trunk Assessment Upper Extremity Assessment Upper Extremity Assessment: LUE deficits/detail;RUE deficits/detail RUE Deficits / Details: overall 2/5 strength, shoulder flexion limited to 90 degrees RUE Coordination: decreased gross motor LUE Deficits / Details: overall 2/5 strength, shoulder flexion limited to 90 degrees LUE Coordination: decreased gross motor   Lower Extremity Assessment Lower Extremity Assessment: Defer to PT evaluation RLE Deficits / Details: AAROM limited hip flexion with h/o THA, noted able to flex to about 110; other AAROM WFL, strength not even 3/5 LLE Deficits / Details: L AKA, AAROM also limited hip flexion to about 70   Cervical / Trunk Assessment Cervical / Trunk Assessment: Other exceptions;Kyphotic Cervical / Trunk Exceptions: severe scoliosis with C curve  convex to R   Communication Communication Communication: No difficulties   Cognition Arousal/Alertness: Awake/alert Behavior During Therapy: WFL for tasks assessed/performed Overall Cognitive Status: History of cognitive impairments - at baseline                     General Comments       Exercises       Shoulder Instructions      Home Living Family/patient expects to be discharged to:: Private residence Living Arrangements: Other relatives (granddaughter) Available Help at Discharge: Family;Personal care attendant;Available 24 hours/day Type of Home:  House       Home Layout: One level               Home Equipment: [place] - 4 wheels;Bedside commode;Tub bench;Wheelchair - manual;Hand held shower head          Prior Functioning/Environment Level of Independence: Needs assistance  Gait / Transfers Assistance Needed: daughter assist to recliner or w/c; pt incontinent and does not ambulate ADL's / Homemaking Assistance Needed: Total assist for all bathing and dressing, wear depends and granddaughter changes her at bed level. Pt is able to self-feed with supervision and will wash her face and attempt to brush teeth and hair        OT Diagnosis: Generalized weakness;Cognitive deficits   OT Problem List: Decreased strength;Decreased range of motion;Decreased activity tolerance;Impaired balance (sitting and/or standing);Decreased coordination;Decreased cognition;Decreased safety awareness;Decreased knowledge of use of DME or AE   OT Treatment/Interventions:      OT Goals(Current goals can be found in the care plan section) Acute Rehab OT Goals Patient Stated Goal: pt's granddaughter would like pt to go back home OT Goal Formulation: With patient Time For Goal Achievement: 10/03/15 Potential to Achieve Goals: Good  OT Frequency:     Barriers to D/C:            Co-evaluation PT/OT/SLP Co-Evaluation/Treatment: Yes Reason for Co-Treatment: Complexity of the patient's impairments (multi-system involvement);For patient/therapist safety PT goals addressed during session: Mobility/safety with mobility;Balance OT goals addressed during session: ADL's and self-care      End of Session Equipment Utilized During Treatment: Gait belt Nurse Communication: Mobility status  Activity Tolerance: Patient tolerated treatment well Patient left: in chair;with call bell/phone within reach;with chair alarm set;with family/visitor present   Time: [id number] OT Time Calculation (min): 24 min Charges:  OT General Charges $OT Visit: 1  Procedure OT Evaluation $OT Eval High Complexity: 1 Procedure G-Codes: OT G-codes **NOT FOR INPATIENT CLASS** Functional Assessment Tool Used: clinical judgement Functional Limitation: Self care Self Care Current Status [id number]): At least 80 percent but less than 100 percent impaired, limited or restricted Self Care Goal Status [id number]): At least 80 percent but less than 100 percent impaired, limited or restricted Self Care Discharge Status [id number]): At least 80 percent but less than 100 percent impaired, limited or restricted  Redmond Baseman, OTR/L Pager: [phone] 09/19/2015, 3:05 PM

## 2015-09-19 NOTE — Progress Notes (Signed)
Pt/family given discharge instructions, medication lists, follow up appointments, and when to call the doctor.  Pt/family verbalizes understanding. Patient transported home with granddaughter.  All question answered. Explained dressing change procedure for right foot.  Payton Emerald, RN

## 2015-09-19 NOTE — Discharge Summary (Signed)
Name: Rachel [name] MRN: [id number] DOB: May 12, 1927 82 y.o. PCP: Axel Filler, MD  Date of Admission: 09/17/2015  7:14 PM Date of Discharge: 09/19/2015 Attending Physician: Aldine Contes, MD  Discharge Diagnosis: 1.  Principal Problem:   Syncope Active Problems:   Dementia   Hypothyroidism   CKD (chronic kidney disease) stage 3, GFR 30-59 ml/min   Thrombocytosis (HCC)   Pressure ulcer of right heel   Severe protein-calorie malnutrition (HCC)   Essential hypertension   Renal insufficiency   Anemia   Pressure ulcer   Discharge Medications:   Medication List    STOP taking these medications        amLODipine 5 MG tablet  Commonly known as:  NORVASC      TAKE these medications        acetaminophen 500 MG tablet  Commonly known as:  TYLENOL  Take 500-1,000 mg by mouth every 6 (six) hours as needed for mild pain, moderate pain or headache.     bisacodyl 5 MG EC tablet  Commonly known as:  DULCOLAX  Take 1 tablet (5 mg total) by mouth daily as needed for moderate constipation.     clopidogrel 75 MG tablet  Commonly known as:  PLAVIX  Take 1 tablet (75 mg total) by mouth daily.     collagenase ointment  Commonly known as:  SANTYL  Apply topically daily.     feeding supplement (ENSURE ENLIVE) Liqd  Take 237 mLs by mouth 2 (two) times daily between meals.     ferrous sulfate 325 (65 FE) MG tablet  Take 1 tablet (325 mg total) by mouth daily with breakfast.     levothyroxine 25 MCG tablet  Commonly known as:  SYNTHROID, LEVOTHROID  Take 1 tablet (25 mcg total) by mouth daily before breakfast.     multivitamin with minerals Tabs tablet  Take 1 tablet by mouth daily.     pravastatin 20 MG tablet  Commonly known as:  PRAVACHOL  Take 1 tablet (20 mg total) by mouth daily at 6 PM.     senna-docusate 8.6-50 MG tablet  Commonly known as:  Senokot-S  Take 1 tablet by mouth at bedtime as needed for mild constipation.     vitamin A & D ointment    Apply 1 application topically as needed (with every adult brief change.).        Disposition and follow-up:   Ms.Paden Craig-Rudd was discharged from [facility] in Stable condition.  At the hospital follow up visit please address:  1.  Syncope- likely orthostatic vs due to severe LVH and aortic calcification. Repeat echo showed EF of 65-70% and severe concentric hypertrophy and G1DD with with basal septal thickness of 20 mm.. Severely thickened aortic valve with severe calcification and moderate stenosis. LVOT gradient of 65 mmHg. Moderate pulmonary hypertension. Amlodipine was stopped on discharge.  HTN re-assess blood pressure  Hypothyroidism: TSH of 11.93. Re-check TSH in few weeks.  Thrombocytosis: follow up work-up. JAK 2 pending. Follows with Dr Beryle Beams  2.  Labs / imaging needed at time of follow-up:   3.  Pending labs/ test needing follow-up:   Follow-up Appointments:     Follow-up Information    Follow up with [facility] On 09/24/2015.   Why:  2:45 PM for hospital follow up    Contact information:   [address] [place] [id number]      Follow up with Annia Belt,  MD On 10/08/2015.   Specialty:  Oncology   Why:  1:30 PM   Contact information:   [place] [address] [phone]       Follow up with Axel Filler, MD On 10/08/2015.   Specialty:  Internal Medicine   Why:  10:15 AM   Contact information:   [place] [address] [phone]     HPI Aquita Quinnell is a 82 y.o. female, w/ Dementia, CKD stage3, Hypertension, Hyperlipidemia, MS, Hypothyroidism apparently went to see Dr. Beryle Beams for thrombocytosis and had labs and then just got home and was still in the car when had possible syncope "not responsive per her daughter to voice" Lasting about 1 minute. Then when she came back c/o chest pain. Pt was not able to describe  her symptoms and a poor historian. Pt was driven to ER by her daughter.   Hospital Course by problem list: Principal Problem:   Syncope Active Problems:   Dementia   Hypothyroidism   CKD (chronic kidney disease) stage 3, GFR 30-59 ml/min   Thrombocytosis (HCC)   Pressure ulcer of right heel   Severe protein-calorie malnutrition (HCC)   Essential hypertension   Renal insufficiency   Anemia   Pressure ulcer   Syncope Likely orthostatic or vasovagal syncope. EKG was unremarkable. Troponin was negative. In February had event monitor which was negative for afib. Echo iN Dec was EF 55-60% and severe calcification of aortic valve. Carotid dopplers done this morning were unremarkable. CT head was neg, CXR was negative.  Repeat echo showed EF of 65-70% and severe concentric hypertrophy and G1DD with with basal septal thickness of 20 mm.. Severely thickened aortic valve with severe calcification and moderate stenosis. LVOT gradient of 65 mmHg. Moderate pulmonary hypertension. Amlodipine was stopped on discharge.    HTN: blood pressure has been 111/41 to 147/59.    R heel ulcer/pressure ulcers: xray of right heel was negative for osteomyelitis. Wound care recommended ointment.    Hypothyroidism TSH elevated which could be contributing to her bradycardia. Repeat TSH in few weeks  Thrombocytosis Workup in progress, SPEP and [name].   Acute on CKD: Cr back to baseline   Deconditioning. PT/OT consult recommend no further eval- she already has PT set up   Discharge Vitals:   BP 147/59 mmHg  Pulse 83  Temp(Src) 98.2 F (36.8 C) (Oral)  Resp 18  Ht 5\' 4"  (1.626 m)  Wt 104 lb 1.6 oz (47.219 kg)  BMI 17.86 kg/m2  SpO2 100%   Discharge Instructions: Discharge Instructions    Diet - low sodium heart healthy    Complete by:  As directed      Increase activity slowly    Complete by:  As directed            Signed: Burgess Estelle, MD 09/19/2015, 2:22 PM

## 2015-09-19 NOTE — Care Management Note (Addendum)
Case Management Note Marvetta Gibbons RN, BSN Unit 2W-Case Manager [phone]  Patient Details  Name: Rachel [name] MRN: [id number] Date of Birth: 09/21/1927  Subjective/Objective:    Pt admitted with syncope                Action/Plan: PTA pt lived at home with granddaughter- is active with [place] for [facility]- per granddaughter-[name]- pt has all needed DME at home that includes, hospital bed, w/c, shower chair. Plan is to return home with family and resume HHRN- with [place]. Per granddaughter pt will transport home via private vehicle. CM to follow for any further needs.  Expected Discharge Date:                  Expected Discharge Plan:  [place]  In-House Referral:     Discharge planning Services  CM Consult  Post Acute Care Choice:  Resumption of Svcs/PTA Provider, Home Health, Durable Medical Equipment Choice offered to:  [facility] POA / Guardian  DME Arranged:  Other see comment DME Agency:  [place] Arranged:  RN [facility] Agency:  [place]  Status of Service:  Completed, signed off  If discussed at [place] of Stay Meetings, dates discussed:    Additional Comments:  09/19/15- 1530- Marvetta Gibbons RN, BSN- pt for d/c home today- [facility] will resume [place] services for [name]- spoke with Manuela Schwartz with [facility] regarding pt's discharge for today- pt has daily aide assistance too. Verbal order received for hoyer lift at home- spoke with [facility] with [facility] who will arrange for DME delivery to home -   [name], RN 09/19/2015, 3:31 PM

## 2015-09-19 NOTE — Discharge Instructions (Signed)
Please stop taking the amlodipine Please follow up with Dr Beryle Beams Please follow up in our clinic

## 2015-09-19 NOTE — Evaluation (Signed)
Physical Therapy Evaluation Patient Details Name: Rachel [name] MRN: [id number] DOB: Jul 24, 1927 Today's Date: 09/19/2015   History of Present Illness  Rachel [name] is a 82 y.o. female, w/ Dementia, CKD stage3, Hypertension, Hyperlipidemia, MS, Hypothyroidism, recent AKA apparently went to see Dr. Beryle Beams for thrombocytosis and had labs and then just got home and was still in the car when had possible syncope "not responsive per her daughter to voice" Lasting about 1 minute. Then when she came back c/o chest pain.  Clinical Impression  Patient presents with mobility likely at her baseline.  Feel daughter would benefit from a hoyer lift for home.  No further skilled PT needs at this time; however, pt would benefit from nursing assist for OOB to chair with mechanical lift as well as a pressure redistribution pad under pt.    Follow Up Recommendations No PT follow up (already has HHRN once weekly and an aide daily)    Equipment Recommendations  None recommended by PT    Recommendations for Other Services       Precautions / Restrictions Precautions Precautions: Fall Restrictions Weight Bearing Restrictions: No RUE Weight Bearing: Weight bearing as tolerated LUE Weight Bearing: Weight bearing as tolerated RLE Weight Bearing: Non weight bearing LLE Weight Bearing: Non weight bearing (L AKA)      Mobility  Bed Mobility Overal bed mobility: Needs Assistance Bed Mobility: Supine to Sit     Supine to sit: +2 for physical assistance;Max assist     General bed mobility comments: one assist to lift trunk, another for moving hips/legs to EOB  Transfers Overall transfer level: Needs assistance   Transfers: Squat Pivot Transfers     Squat pivot transfers: Total assist     General transfer comment: pt not able to self support with R LE and needs total A to transfer  Ambulation/Gait                Stairs            Wheelchair Mobility    Modified  [place] (Stroke Patients Only)       Balance Overall balance assessment: Needs assistance Sitting-balance support: Bilateral upper extremity supported;Single extremity supported Sitting balance-Leahy Scale: Poor Sitting balance - Comments: at least mod support for sitting balance leans posterior and to L                                     Pertinent Vitals/Pain Pain Assessment: No/denies pain    Home Living Family/patient expects to be discharged to:: Private residence Living Arrangements: Other relatives Advertising account executive) Available Help at Discharge: Family;Available 24 hours/day;Personal care attendant Type of Home: House       Home Layout: One level Home Equipment: [place] - 4 wheels;Bedside commode;Tub bench;Wheelchair - manual;Hand held shower head      Prior Function Level of Independence: Needs assistance   Gait / Transfers Assistance Needed: daughter assist to recliner or w/c; pt incontinent and does not ambulate  ADL's / Homemaking Assistance Needed: pt able to feed herself and wash her face         Hand Dominance        Extremity/Trunk Assessment   Upper Extremity Assessment: Defer to OT evaluation           Lower Extremity Assessment: RLE deficits/detail;LLE deficits/detail RLE Deficits / Details: AAROM limited hip flexion with h/o THA, noted able to flex to about 110; other AAROM  WFL, strength not even 3/5 LLE Deficits / Details: L AKA, AAROM also limited hip flexion to about 70  Cervical / Trunk Assessment: Other exceptions;Kyphotic  Communication   Communication: No difficulties  Cognition Arousal/Alertness: Awake/alert Behavior During Therapy: WFL for tasks assessed/performed Overall Cognitive Status: History of cognitive impairments - at baseline                      General Comments General comments (skin integrity, edema, etc.): grandaughter in room and relates all history; has needed equipment, but agrees hoyer lift  may be helpful    Exercises        Assessment/Plan    PT Assessment [place] does not need any further PT services  PT Diagnosis Generalized weakness   PT Problem List    PT Treatment Interventions     PT Goals (Current goals can be found in the Care Plan section) Acute Rehab PT Goals PT Goal Formulation: All assessment and education complete, DC therapy    Frequency     Barriers to discharge        Co-evaluation PT/OT/SLP Co-Evaluation/Treatment: Yes Reason for Co-Treatment: Complexity of the patient's impairments (multi-system involvement);For patient/therapist safety PT goals addressed during session: Mobility/safety with mobility;Balance         End of Session Equipment Utilized During Treatment: Gait belt Activity Tolerance: Patient tolerated treatment well Patient left: in chair;with call bell/phone within reach (positioned sitting on pillows, R heel floated) Nurse Communication: Mobility status;Need for lift equipment    Functional Assessment Tool Used: Clinical Judgement Functional Limitation: Self care Self Care Current Status [phone]): At least 1 percent but less than 20 percent impaired, limited or restricted Self Care Goal Status [id number]): 0 percent impaired, limited or restricted Self Care Discharge Status [id number]): 0 percent impaired, limited or restricted    Time: 1024-1046 PT Time Calculation (min) (ACUTE ONLY): 22 min   Charges:   PT Evaluation $PT Eval Moderate Complexity: 1 Procedure     PT G Codes:   PT G-Codes **NOT FOR INPATIENT CLASS** Functional Assessment Tool Used: Clinical Judgement Functional Limitation: Self care Self Care Current Status [id number]): At least 1 percent but less than 20 percent impaired, limited or restricted Self Care Goal Status [id number]): 0 percent impaired, limited or restricted Self Care Discharge Status [id number]): 0 percent impaired, limited or restricted    Rachel [name] 09/19/2015, 12:15 PM  Rachel [name],  [place] 09/19/2015

## 2015-09-19 NOTE — Progress Notes (Signed)
Subjective:   There were no acute events overnight. She was undergoing carotid dopplers when examined.  Patient denies any dizziness, or pain. Daughter was at bedside and we explained the findings.   Objective:   Vital signs in last 24 hours: Filed Vitals:   09/18/15 1358 09/18/15 1510 09/18/15 2045 09/19/15 0340  BP: 125/52  111/41 147/59  Pulse: 52  71 83  Temp: 98.1 F (36.7 C)  98.1 F (36.7 C) 98.2 F (36.8 C)  TempSrc: Oral  Oral Oral  Resp: 17  18 18   Height:  5\' 4"  (1.626 m)    Weight:    104 lb 1.6 oz (47.219 kg)  SpO2: 96%  100% 100%    Weight: Filed Weights   09/18/15 0017 09/18/15 0513 09/19/15 0340  Weight: 93 lb (42.185 kg) 101 lb 11.2 oz (46.131 kg) 104 lb 1.6 oz (47.219 kg)    I/Os:  Intake/Output Summary (Last 24 hours) at 09/19/15 1121 Last data filed at 09/19/15 0925  Gross per 24 hour  Intake   1200 ml  Output      0 ml  Net   1200 ml    Physical Exam:  General: Vital signs reviewed. Patient in no acute distress Cardiovascular: regular rate, rhythm, 3/6 systolic murmur most prominent at RUSB Pulmonary/Chest: Clear to auscultation bilaterally, no wheezes, rales, or rhonchi. Abdominal: Soft, non-tender, non-distended, BS + Extremities:Left amputation. Right heel ulcer. No edema. Good pulse on right side. Has sacral ulcer per WOC note      Lab Results:  BMP:  Recent Labs Lab 09/18/15 0131 09/19/15 0330  NA 143 140  K 3.4* 3.8  CL 110 108  CO2 27 25  GLUCOSE 97 94  BUN 41* 37*  CREATININE 1.30* 1.12*  CALCIUM 9.0 8.6*    CBC:  Recent Labs Lab 09/17/15 1640  09/18/15 0131 09/19/15 0330  WBC 12.4*  < > 13.6* 12.2*  NEUTROABS 9.1*  --   --   --   HGB 11.1*  < > 8.7* 8.8*  HCT 36.1  < > 28.9* 29.8*  MCV 66.7*  < > 65.4* 65.8*  PLT 1341*  < > 1100* 1232*  < > = values in this interval not displayed.  Coagulation: No results for input(s): LABPROT, INR in the last 168 hours.  CBG:            Recent Labs Lab  09/18/15 0035 09/18/15 0619 09/18/15 1117 09/18/15 1629 09/18/15 2039 09/19/15 0613  GLUCAP 147* 67 71 63* 143* 93           HA1C:       Recent Labs Lab 09/18/15 0136  HGBA1C 5.1    Lipid Panel: No results for input(s): CHOL, HDL, LDLCALC, TRIG, CHOLHDL, LDLDIRECT in the last 168 hours.  LFTs:  Recent Labs Lab 09/17/15 1640 09/18/15 0131  AST 18 20  ALT 11 17  ALKPHOS 76 52  BILITOT <0.2 0.4  PROT 6.9 5.6*  ALBUMIN 3.9 2.8*    Pancreatic Enzymes: No results for input(s): LIPASE, AMYLASE in the last 168 hours.  Lactic Acid/Procalcitonin: No results for input(s): LATICACIDVEN, PROCALCITON in the last 168 hours.  Ammonia: No results for input(s): AMMONIA in the last 168 hours.  Cardiac Enzymes: No results for input(s): CKTOTAL, CKMB, CKMBINDEX, TROPONINI in the last 168 hours.  EKG: EKG Interpretation  Date/Time:  Monday September 17 2015 17:57:14 EDT Ventricular Rate:  65 PR Interval:  140 QRS Duration: 74 QT Interval:  412  QTC Calculation: 428 R Axis:   17 Text Interpretation:  Normal sinus rhythm Cannot rule out Anterior infarct , age undetermined Abnormal ECG since last tracing no significant change Confirmed by MILLER  MD, BRIAN ([id number]) on 09/17/2015 7:55:53 PM   BNP: No results for input(s): PROBNP in the last 168 hours.  D-Dimer:  Recent Labs Lab 09/17/15 2046 09/18/15 0136  DDIMER 0.56* 0.47    Urinalysis:  Recent Labs Lab 09/17/15 2008 09/18/15 1117  COLORURINE YELLOW YELLOW  LABSPEC 1.005 1.013  PHURINE 8.5* 6.0  GLUCOSEU NEGATIVE NEGATIVE  HGBUR TRACE* TRACE*  BILIRUBINUR NEGATIVE NEGATIVE  KETONESUR NEGATIVE NEGATIVE  PROTEINUR 100* NEGATIVE  NITRITE NEGATIVE NEGATIVE  LEUKOCYTESUR MODERATE* LARGE*    Micro Results: Recent Results (from the past 240 hour(s))  Urine culture     Status: Abnormal   Collection Time: 09/17/15  8:08 PM  Result Value Ref Range Status   Specimen Description URINE, RANDOM  Final   Special  Requests NONE  Final   Culture MULTIPLE SPECIES PRESENT, SUGGEST RECOLLECTION (A)  Final   Report Status 09/19/2015 FINAL  Final    Blood Culture:    Component Value Date/Time   SDES URINE, RANDOM 09/17/2015 2008   SPECREQUEST NONE 09/17/2015 2008   CULT MULTIPLE SPECIES PRESENT, SUGGEST RECOLLECTION* 09/17/2015 2008   REPTSTATUS 09/19/2015 FINAL 09/17/2015 2008    Studies/Results: Dg Chest 2 View  09/17/2015  CLINICAL DATA:  82 year old female with chest pain and altered mental status EXAM: CHEST  2 VIEW COMPARISON:  Chest radiograph dated 04/28/2015 FINDINGS: The patient is tilted and rotated to the left. There is no focal consolidation, pleural effusion, or pneumothorax. There is mild eventration of the left hemidiaphragm similar to prior study. Stable cardiac silhouette. No acute osseous pathology. IMPRESSION: No active cardiopulmonary disease. Electronically Signed   By: Anner Crete M.D.   On: 09/17/2015 20:21   Ct Head Wo Contrast  09/17/2015  CLINICAL DATA:  82 year old female with confusion EXAM: CT HEAD WITHOUT CONTRAST TECHNIQUE: Contiguous axial images were obtained from the base of the skull through the vertex without intravenous contrast. COMPARISON:  Head CT dated 02/27/2015 and brain MRI dated 02/27/2015 FINDINGS: There is moderate age-related atrophy and chronic microvascular ischemic changes. There is no acute intracranial hemorrhage. No mass effect or midline shift noted. The visualized paranasal sinuses and mastoid air cells are clear. The calvarium is intact. IMPRESSION: No acute intracranial hemorrhage. Age-related atrophy and chronic microvascular ischemic disease. If symptoms persist and there are no contraindications, MRI may provide better evaluation if clinically indicated. Electronically Signed   By: Anner Crete M.D.   On: 09/17/2015 20:42   Dg Foot Complete Right  09/18/2015  CLINICAL DATA:  Nonhealing soft tissue wound on heel. EXAM: RIGHT FOOT COMPLETE -  3+ VIEW COMPARISON:  None. FINDINGS: Soft tissue ulceration is seen along the posterior aspect of the heel. No evidence of adjacent osteolysis or periostitis. Diffuse osteopenia noted. No evidence of acute fracture or dislocation. Mild hallux valgus and bony bunion noted. IMPRESSION: Soft tissue ulceration in the posterior heel. No radiographic evidence of osteomyelitis or other acute osseous abnormality. Diffuse osteopenia.  Mild hallux valgus with bony bunion. Electronically Signed   By: Earle Gell M.D.   On: 09/18/2015 13:34    Medications:  Scheduled Meds: . clopidogrel  75 mg Oral Daily  . collagenase   Topical Daily  . feeding supplement (ENSURE ENLIVE)  237 mL Oral BID BM  . ferrous sulfate  325 mg Oral  Q breakfast  . heparin  5,000 Units Subcutaneous Q8H  . levothyroxine  25 mcg Oral QAC breakfast  . multivitamin with minerals  1 tablet Oral Daily  . pravastatin  20 mg Oral q1800  . sodium chloride flush  3 mL Intravenous Q12H   Continuous Infusions:   PRN Meds: acetaminophen **OR** acetaminophen, bisacodyl, senna-docusate  Antibiotics: Antibiotics Given (last 72 hours)    None       Assessment/Plan:   Principal Problem:   Syncope Active Problems:   Dementia   Hypothyroidism   CKD (chronic kidney disease) stage 3, GFR 30-59 ml/min   Thrombocytosis (HCC)   Pressure ulcer of right heel   Severe protein-calorie malnutrition (HCC)   Essential hypertension   Renal insufficiency   Anemia   Pressure ulcer  Syncope Likely orthostatic or vasovagal syncope. EKG was unremarkable. Troponin was negative. In February had event monitor which was negative for afib. Echo iN Dec was EF 55-60% and severe calcification of aortic valve. Carotid dopplers done this morning were unremarkable. Echo results are pending. CT head was neg, CXR was negative.   -follow up echo results -stop amlodipine on discharge -discharge with outpatient follow up   HTN: blood pressure has been 111/41 to  147/59. -continue to hold amlodipine on discharge   R heel ulcer/pressure ulcers: xray of right heel was negative for osteomyelitis -continue wound care   Hypothyroidism TSH elevated which could be contributing to her bradycardia.  Unclear if she was compliant? -follow up outpatient   Thrombocytosis Workup in progress, SPEP and [name] pending.  -management per Dr Beryle Beams   Acute on CKD:  Cr back to baseline   Deconditioning -PT/OT consult recommend no further eval- she already has PT set up     Disposition Disposition is deferred, awaiting improvement of current medical problems.  Anticipated discharge in approximately 1-2 day(s).      Rachel Estelle, MD PGY-1 Internal Medicine Teaching Service 09/19/2015, 11:21 AM

## 2015-09-19 NOTE — Progress Notes (Signed)
[facility]  Patient Status: Active (receiving services up to time of hospitalization)  [facility] is providing the following services: RN  If patient discharges after hours, please call [phone].   Rachel [name] 09/19/2015, 12:03 PM

## 2015-09-20 ENCOUNTER — Telehealth: Payer: Self-pay | Admitting: Internal Medicine

## 2015-09-20 DIAGNOSIS — R531 Weakness: Secondary | ICD-10-CM | POA: Diagnosis not present

## 2015-09-20 NOTE — Telephone Encounter (Signed)
Need TOC Discharge date 09/19/15 HFU 10/04/15

## 2015-09-21 DIAGNOSIS — R531 Weakness: Secondary | ICD-10-CM | POA: Diagnosis not present

## 2015-09-24 ENCOUNTER — Other Ambulatory Visit: Payer: Self-pay | Admitting: Pulmonary Disease

## 2015-09-24 ENCOUNTER — Ambulatory Visit: Payer: Medicare HMO

## 2015-09-24 DIAGNOSIS — R531 Weakness: Secondary | ICD-10-CM | POA: Diagnosis not present

## 2015-09-24 LAB — JAK2 W/REFLEX TO CALR/MPL

## 2015-09-25 DIAGNOSIS — R531 Weakness: Secondary | ICD-10-CM | POA: Diagnosis not present

## 2015-09-25 NOTE — Progress Notes (Signed)
Patient ID: Rachel [name], female   DOB: 08-26-1927, 82 y.o.   MRN: [id number] Review of the peripheral blood film: Marked increase in platelets some with abnormal morphology. Subpopulation of very large platelets. 3+ anisopoikilocytosis of the red cells with multiple variations in sizes and shape. No polychromasia. 1+ target cells. No inclusions. Neutrophils and lymphocytes appear mature.  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

## 2015-09-26 DIAGNOSIS — R531 Weakness: Secondary | ICD-10-CM | POA: Diagnosis not present

## 2015-09-27 ENCOUNTER — Telehealth: Payer: Self-pay | Admitting: Licensed Clinical Social Worker

## 2015-09-27 DIAGNOSIS — I69354 Hemiplegia and hemiparesis following cerebral infarction affecting left non-dominant side: Secondary | ICD-10-CM | POA: Diagnosis not present

## 2015-09-27 DIAGNOSIS — G35 Multiple sclerosis: Secondary | ICD-10-CM | POA: Diagnosis not present

## 2015-09-27 DIAGNOSIS — I6789 Other cerebrovascular disease: Secondary | ICD-10-CM | POA: Diagnosis not present

## 2015-09-27 DIAGNOSIS — R531 Weakness: Secondary | ICD-10-CM | POA: Diagnosis not present

## 2015-09-27 NOTE — Telephone Encounter (Signed)
Please see CSW note 09/27/15

## 2015-09-27 NOTE — Telephone Encounter (Signed)
[place]

## 2015-09-27 NOTE — Telephone Encounter (Signed)
No answer

## 2015-09-27 NOTE — Telephone Encounter (Signed)
Transition Care Management Follow-up Telephone Call   Date discharged? 6/28   How have you been since you were released from the hospital? good   Do you understand why you were in the hospital? Yes   Do you understand the discharge instructions? No questions   Where were you discharged to? Lives w/ grandaughter   Items Reviewed:  Medications reviewed: need thyroid, stop BP med  Allergies reviewed: no  Dietary changes reviewed: yes  Referrals reviewed: yes   Functional Questionnaire:   Activities of Daily Living (ADLs):   She states they are independent in the following: must have help, grdaugh helps States they require assistance with the following: all things   Any transportation issues/concerns?: yes, please have csw call back   Any patient concerns? no   Confirmed importance and date/time of follow-up visits scheduled yes  Provider Appointment booked with 7/17, dr'sgranfortuna and vincent  Confirmed with patient if condition begins to worsen call PCP or go to the ER.  Patient was given the office number and encouraged to call back with question or concerns.  : yes, [phone] or 911 or ED

## 2015-09-27 NOTE — Telephone Encounter (Signed)
CSW placed called to pt/family as family discussed transportation as being a barrier during the [facility] call.  CSW left message requesting return call.  CSW provided the number to [employer] eligibility office, as [employer] application has been completed and faxed in May 2017. CSW provided contact hours and phone number.

## 2015-09-27 NOTE — Telephone Encounter (Signed)
Edwena Blow, pt's granddaughter would like you to call her concerning transportation, it is the # listed for her not the pt

## 2015-09-28 DIAGNOSIS — R531 Weakness: Secondary | ICD-10-CM | POA: Diagnosis not present

## 2015-10-01 DIAGNOSIS — R531 Weakness: Secondary | ICD-10-CM | POA: Diagnosis not present

## 2015-10-02 DIAGNOSIS — R531 Weakness: Secondary | ICD-10-CM | POA: Diagnosis not present

## 2015-10-03 DIAGNOSIS — R531 Weakness: Secondary | ICD-10-CM | POA: Diagnosis not present

## 2015-10-04 DIAGNOSIS — R531 Weakness: Secondary | ICD-10-CM | POA: Diagnosis not present

## 2015-10-05 DIAGNOSIS — R531 Weakness: Secondary | ICD-10-CM | POA: Diagnosis not present

## 2015-10-08 ENCOUNTER — Encounter: Payer: Medicare HMO | Admitting: Oncology

## 2015-10-08 ENCOUNTER — Ambulatory Visit: Payer: Medicare HMO | Admitting: Student in an Organized Health Care Education/Training Program

## 2015-10-08 DIAGNOSIS — R531 Weakness: Secondary | ICD-10-CM | POA: Diagnosis not present

## 2015-10-09 ENCOUNTER — Encounter: Payer: Medicare HMO | Admitting: Oncology

## 2015-10-09 DIAGNOSIS — R531 Weakness: Secondary | ICD-10-CM | POA: Diagnosis not present

## 2015-10-10 DIAGNOSIS — R531 Weakness: Secondary | ICD-10-CM | POA: Diagnosis not present

## 2015-10-11 DIAGNOSIS — R531 Weakness: Secondary | ICD-10-CM | POA: Diagnosis not present

## 2015-10-12 ENCOUNTER — Other Ambulatory Visit: Payer: Self-pay | Admitting: Oncology

## 2015-10-12 ENCOUNTER — Encounter: Payer: Self-pay | Admitting: Oncology

## 2015-10-12 DIAGNOSIS — R531 Weakness: Secondary | ICD-10-CM | POA: Diagnosis not present

## 2015-10-12 DIAGNOSIS — D471 Chronic myeloproliferative disease: Secondary | ICD-10-CM

## 2015-10-12 DIAGNOSIS — D473 Essential (hemorrhagic) thrombocythemia: Secondary | ICD-10-CM | POA: Insufficient documentation

## 2015-10-12 HISTORY — DX: Chronic myeloproliferative disease: D47.1

## 2015-10-12 MED ORDER — PROMETHAZINE HCL 12.5 MG PO TABS: 12.5000 mg | ORAL_TABLET | Freq: Four times a day (QID) | ORAL | Status: DC | PRN

## 2015-10-12 MED ORDER — HYDROXYUREA 500 MG PO CAPS: 500.0000 mg | ORAL_CAPSULE | Freq: Every day | ORAL | Status: DC

## 2015-10-12 NOTE — Progress Notes (Unsigned)
Patient ID: Rachel [name], female   DOB: 08-26-1927, 82 y.o.   MRN: [id number] Able to reach grand-daughter, main caregiver. I reviewed dx of myeloproliferative disorder/essential thrombocythmia. I reviewed use of Hydroxyurea. Potential side effects to include mild nausea and suppression of hemoglobin and white count. I told her we have used this drug successfully for many years. It is the same med we use for people with Sickle Cell anemia. I will send in Rx for 500 mg capsule to take once daily. Phenergan 12.5 mg as needed for nausea. Check CBC every 2 weeks until dose titrated to stable count then once monthly.

## 2015-10-15 ENCOUNTER — Encounter: Payer: Self-pay | Admitting: Student in an Organized Health Care Education/Training Program

## 2015-10-15 ENCOUNTER — Ambulatory Visit (INDEPENDENT_AMBULATORY_CARE_PROVIDER_SITE_OTHER): Payer: Medicare HMO | Admitting: Student in an Organized Health Care Education/Training Program

## 2015-10-15 ENCOUNTER — Encounter (INDEPENDENT_AMBULATORY_CARE_PROVIDER_SITE_OTHER): Payer: Self-pay

## 2015-10-15 VITALS — BP 141/98 | HR 49 | Temp 97.7°F | Ht 64.0 in | Wt 104.1 lb

## 2015-10-15 DIAGNOSIS — Z8673 Personal history of transient ischemic attack (TIA), and cerebral infarction without residual deficits: Secondary | ICD-10-CM

## 2015-10-15 DIAGNOSIS — L89614 Pressure ulcer of right heel, stage 4: Secondary | ICD-10-CM | POA: Diagnosis not present

## 2015-10-15 DIAGNOSIS — E43 Unspecified severe protein-calorie malnutrition: Secondary | ICD-10-CM | POA: Diagnosis not present

## 2015-10-15 DIAGNOSIS — E038 Other specified hypothyroidism: Secondary | ICD-10-CM | POA: Diagnosis not present

## 2015-10-15 DIAGNOSIS — I739 Peripheral vascular disease, unspecified: Secondary | ICD-10-CM

## 2015-10-15 DIAGNOSIS — D509 Iron deficiency anemia, unspecified: Secondary | ICD-10-CM

## 2015-10-15 DIAGNOSIS — F0390 Unspecified dementia without behavioral disturbance: Secondary | ICD-10-CM

## 2015-10-15 DIAGNOSIS — E034 Atrophy of thyroid (acquired): Secondary | ICD-10-CM

## 2015-10-15 DIAGNOSIS — L8990 Pressure ulcer of unspecified site, unspecified stage: Secondary | ICD-10-CM

## 2015-10-15 DIAGNOSIS — D473 Essential (hemorrhagic) thrombocythemia: Secondary | ICD-10-CM

## 2015-10-15 DIAGNOSIS — R531 Weakness: Secondary | ICD-10-CM | POA: Diagnosis not present

## 2015-10-15 MED ORDER — LEVOTHYROXINE SODIUM 25 MCG PO TABS: 50.0000 ug | ORAL_TABLET | Freq: Every day | ORAL | 1 refills | Status: DC

## 2015-10-15 NOTE — Progress Notes (Signed)
Assessment and Plan:  Pressure ulcer of right heel, stage 4 (HCC) I evaluated her right heel wound today in clinic with our wound nurse. It seems to be healing very well. I think the cause of this wound is a combination of arterial insufficiency and a chronic pressure. Plan is to continue simple dry dressings daily, no peroxide, continue boot for offloading of pressure.   Essential thrombocythemia ([place]) Platelet count has been high for the last 2 years [facility]. She's had at least one CVA and potentially the left leg acute ischemia could be attributed to thrombocytosis. Platelet counts was 1.4 million in June. she is established with Dr. Darnell Level in our hematology clinic and was just started on Hydroxyurea 500 mg daily. She is tolerating this dosage well with no side effects so far. It is too early to recheck CBC. Plan to continue Hydrea with close monitoring. I discussed this case with Dr. Darnell Level in person to better coordinate her care.  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 ABIs of the right leg showed severe arterial insufficiency with an ABI of 0.2. She is doing well on antiplatelet without adverse effects. No tobacco use. We are also working to lower her platelet count with hydroxyurea. Plan is to continue clopidogrel and pravastatin.  Dementia Patient requires assistance no most all of her activities of daily living. Cause of her dementia could be multifactorial with vascular components, she also has a history of multiple sclerosis but not currently on treatment. Her granddaughter Rachel [name] is doing a excellent job taking care of her, she and her husband both have 24-hour supervision currently at home. She has a significant amount of disability because of her left-sided above-knee amputation. Given the amount of support that she requires and her overall goals to remain at home for as long as possible, I placed a  referral to palliative care team for home-based interventions as needed.  Iron deficiency anemia Hemoglobin ranging between 8 and 9 g, very microcytic with MCV around 65. Ferritin of 11. I need to follow up to see when her last colonoscopy was in [place]. Were trying to supplement her iron currently with ferrous sulfate 325 mg daily. She follows with Dr. Darnell Level with hematology. She is on antiplatelet with clopidogrel, no obvious source of bleeding. Given her overall fragility I would like to hold off colonoscopy for now and instead see if we can supplement her iron adequately.  Hypothyroidism Unclear etiology of hypothyroidism, likely atrophy or old Hashimoto's thyroiditis, has been on very low dose of replacement for many years. Granddaughter reports current dose of Synthroid has been stable for over 2 years. Last TSH in June was elevated around 11. Plan to increase Synthroid to 50 g daily and repeat TSH in 8 weeks.   __________________________________________________________  HPI:  82 year old woman here for follow-up of right lower extremity wound. The patient has pretty significant dementia and is accompanied today by her primary caretaker Rachel [name], who is her daughter. The patient and her 6 year old husband both live with Rachel [name] and are supervised 24 hours a day. Rachel [name] is severely disabled from a left-sided above-knee amputation due to acute limb ischemia in February 2017. She requires assistance with almost all of her activities of daily living. She has a small stage IV pressure ulcer on her right heel present for several months. Patient denies any pain, no fevers or chills. She reports eating and drinking well, her  diet is supplemented by Ensure. Kimberly handles all of her dressing changes and uses simple gauze with packings to keep it clean and peroxide to clean the wound daily. They report following up with the wound care center as able, however it's been difficult recently with  transportation issues and frequent hospitalizations.  She was hospitalized in June for a atypical syncope which was theorized because by relative hypotension in the setting of her malnutrition. Amlodipine was discontinued, they report not having any further problems with syncope or presyncope. Rachel [name] helps her with all of her other medications. Last week they started hydroxyurea for treatment of essential thrombocytosis, so far tolerating it well with no adverse side effects.  __________________________________________________________  Problem List: Patient Active Problem List   Diagnosis Date Noted  . Essential thrombocythemia ([place]) 10/12/2015    Priority: High  . Myeloproliferative disorder ([place]) 10/12/2015    Priority: High  . Peripheral vascular disease ([place]) 04/21/2015    Priority: High  . Severe protein-calorie malnutrition ([place]) 04/21/2015    Priority: High  . Iron deficiency anemia 09/17/2015    Priority: Medium  . Pressure ulcer of right heel, stage 4 ([place]) 04/15/2015    Priority: Medium  . CKD (chronic kidney disease) stage 3, GFR 30-59 ml/min 04/13/2015    Priority: Medium  . Dementia 02/28/2015    Priority: Medium  . Hypothyroidism 02/28/2015    Priority: Medium  . Essential hypertension 07/11/2015    Priority: Low    Medications: Reconciled today in Epic __________________________________________________________  Physical Exam:  Vital Signs: Vitals:   10/15/15 1040  BP: (!) 141/98  Pulse: (!) 49  Temp: 97.7 F (36.5 C)  TempSrc: Oral  Weight: 104 lb 1.6 oz (47.2 kg)  Height: 5\' 4"  (1.626 m)    Gen: Frail, chronically ill appearing woman, no distress CV: RRR, no murmurs Pulm: Normal effort, CTA throughout, no wheezing Abd: Soft, NT, ND.  Ext: Left AKA, right leg is wrapped in a boot. The right heel has a 2x2 cm deep ulcer with healthy appearing tissue surrounding it, no drianage, no odor, no escar or fluctuance. There is a 1x1cm new ulcer between the  right 4th and 5th toes with fresh granulation tissue and small serous drainage. Faint DP pulse on the right. Fingers are cool to the touch with a small tinge of blue on the fingertips bilaterally.

## 2015-10-15 NOTE — Assessment & Plan Note (Signed)
Platelet count has been high for the last 2 years [facility]. She's had at least one CVA and potentially the left leg acute ischemia could be attributed to thrombocytosis. Platelet counts was 1.4 million in June. she is established with Dr. Darnell Level in our hematology clinic and was just started on Hydroxyurea 500 mg daily. She is tolerating this dosage well with no side effects so far. It is too early to recheck CBC. Plan to continue Hydrea with close monitoring. I discussed this case with Dr. Darnell Level in person to better coordinate her care.

## 2015-10-15 NOTE — Assessment & Plan Note (Signed)
Acute ischemia of the left leg in 2017 resulted in an above-knee amputation. ABIs of the right leg showed severe arterial insufficiency with an ABI of 0.2. She is doing well on antiplatelet without adverse effects. No tobacco use. We are also working to lower her platelet count with hydroxyurea. Plan is to continue clopidogrel and pravastatin.

## 2015-10-15 NOTE — Assessment & Plan Note (Signed)
Hemoglobin ranging between 8 and 9 g, very microcytic with MCV around 65. Ferritin of 11. I need to follow up to see when her last colonoscopy was in [place]. Were trying to supplement her iron currently with ferrous sulfate 325 mg daily. She follows with Dr. Darnell Level with hematology. She is on antiplatelet with clopidogrel, no obvious source of bleeding. Given her overall fragility I would like to hold off colonoscopy for now and instead see if we can supplement her iron adequately.

## 2015-10-15 NOTE — Assessment & Plan Note (Signed)
I evaluated her right heel wound today in clinic with our wound nurse. It seems to be healing very well. I think the cause of this wound is a combination of arterial insufficiency and a chronic pressure. Plan is to continue simple dry dressings daily, no peroxide, continue boot for offloading of pressure.

## 2015-10-15 NOTE — Assessment & Plan Note (Signed)
Unclear etiology of hypothyroidism, likely atrophy or old Hashimoto's thyroiditis, has been on very low dose of replacement for many years. Granddaughter reports current dose of Synthroid has been stable for over 2 years. Last TSH in June was elevated around 11. Plan to increase Synthroid to 50 g daily and repeat TSH in 8 weeks.

## 2015-10-15 NOTE — Assessment & Plan Note (Signed)
Patient requires assistance no most all of her activities of daily living. Cause of her dementia could be multifactorial with vascular components, she also has a history of multiple sclerosis but not currently on treatment. Her granddaughter [name] is doing a excellent job taking care of her, she and her husband both have 24-hour supervision currently at home. She has a significant amount of disability because of her left-sided above-knee amputation. Given the amount of support that she requires and her overall goals to remain at home for as long as possible, I placed a referral to palliative care team for home-based interventions as needed.

## 2015-10-15 NOTE — Patient Instructions (Addendum)
1. It was great seeing you in clinic today.   2. Please increase synthroid to 50 mcg daily (two tablets). We will recheck the thyroid levels in a few months.   3. Continue all your other medications as currently prescribed.  4. The wounds on her right foot are healing nicely. Continue to follow-up with the wound care center. Keep them clean and dry, and try to change her positioning at least every 3 hours.  5. Follow up with Dr. Darnell Level to increase the hydrea doses in the future. If you notice any new skin changes or increased pain in her fingers or toes, call us as soon as you can.

## 2015-10-16 ENCOUNTER — Encounter: Payer: Self-pay | Admitting: Licensed Clinical Social Worker

## 2015-10-16 DIAGNOSIS — R531 Weakness: Secondary | ICD-10-CM | POA: Diagnosis not present

## 2015-10-16 NOTE — Progress Notes (Signed)
CSW met with [name] and grand-daughter following pt's scheduled [facility] appointment.  Family offered update on family and patient's current services/needs.  Grand-daughter states [employer] was in need of additional information for Rachel [name]'s application, however, spouse's application approved. Pt/family informed applications were faxed to [employer] at same time and this worker would re-fax Rachel [name]'s application.  No additional social work needs were voiced at this time. Rachel [name]'s completed [employer] application was on file, CSW re-fax application.  Letter mailed to family to notify.

## 2015-10-17 DIAGNOSIS — R531 Weakness: Secondary | ICD-10-CM | POA: Diagnosis not present

## 2015-10-17 NOTE — Progress Notes (Signed)
Rachel [name], states she received information on Home-Based Palliative Care from [place] and has discussed with PCP.  Referral placed.

## 2015-10-18 DIAGNOSIS — R531 Weakness: Secondary | ICD-10-CM | POA: Diagnosis not present

## 2015-10-19 DIAGNOSIS — R531 Weakness: Secondary | ICD-10-CM | POA: Diagnosis not present

## 2015-10-22 ENCOUNTER — Encounter: Payer: Self-pay | Admitting: Oncology

## 2015-10-22 ENCOUNTER — Ambulatory Visit (INDEPENDENT_AMBULATORY_CARE_PROVIDER_SITE_OTHER): Payer: Medicare HMO | Admitting: Oncology

## 2015-10-22 ENCOUNTER — Encounter (INDEPENDENT_AMBULATORY_CARE_PROVIDER_SITE_OTHER): Payer: Self-pay

## 2015-10-22 VITALS — BP 162/60 | HR 48 | Temp 97.6°F

## 2015-10-22 DIAGNOSIS — Z8673 Personal history of transient ischemic attack (TIA), and cerebral infarction without residual deficits: Secondary | ICD-10-CM | POA: Diagnosis not present

## 2015-10-22 DIAGNOSIS — D471 Chronic myeloproliferative disease: Secondary | ICD-10-CM

## 2015-10-22 DIAGNOSIS — D509 Iron deficiency anemia, unspecified: Secondary | ICD-10-CM

## 2015-10-22 DIAGNOSIS — Z89612 Acquired absence of left leg above knee: Secondary | ICD-10-CM | POA: Diagnosis not present

## 2015-10-22 DIAGNOSIS — R531 Weakness: Secondary | ICD-10-CM | POA: Diagnosis not present

## 2015-10-22 DIAGNOSIS — D473 Essential (hemorrhagic) thrombocythemia: Secondary | ICD-10-CM

## 2015-10-22 NOTE — Patient Instructions (Addendum)
To lab today Repeat blood work every 2 weeks until otherwise instructed MD visit 3 months - please try to coordinate with Dr Vincent's appointment days. Lab day of visit

## 2015-10-22 NOTE — Progress Notes (Signed)
Hematology and Oncology Follow Up Visit  Rachel [name] [id number] 31-Mar-1927 82 y.o. 10/22/2015 4:41 PM   Principle Diagnosis: Encounter Diagnoses  Name Primary?  . Essential thrombocythemia ([place]) Yes  . Myeloproliferative disorder ([place])    Interim History:   Follow-up visit for this 82 year old woman I recently evaluated for marked thrombocytosis. Please see office consult note dictated June 26 for for details. She had a number of reasons to have an elevated platelet count however the platelet count reached 1.4 million. She had a history of a stroke in December 2016. I was concerned that she had an underlying myeloproliferative disorder. 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 She had multiple additional laboratory abnormalities at time of her first visit. She had a change in her level of consciousness on the way home from my visit and was taken back to the hospital and admitted for further evaluation. She was dehydrated with secondary hypercalcemia which normalized following administration of fluids. She was not found to have another stroke. Once her condition stabilized and she was discharged, I contacted her daughter with results of the special hematology tests noted above and started the patient on hydroxyurea 500 mg daily which she started on 10/19/2015. She has tolerated the medication well. I prescribed a prophylactic antiemetic but she has not needed to take this. She is still seeing wound care for a slowly healing wound on her left AKA stump. She had an episode of transient diarrhea last week and missed her appointment. She did have a follow-up visit with Dr. Evette Doffing on July 24. She was started on iron supplementation for her microcytic anemia. TSH was elevated and her Synthroid dose was adjusted.  Medications: reviewed  Allergies:  Allergies  Allergen Reactions  . Aspirin Other (See Comments)    Reaction unknown, per granddaughter     Review of Systems: See interim history  Remaining ROS negative:   Physical Exam: Blood pressure (!) 162/60, pulse (!) 48, temperature 97.6 F (36.4 C), temperature source Oral. Wt Readings from Last 3 Encounters:  10/15/15 104 lb 1.6 oz (47.2 kg)  09/19/15 104 lb 1.6 oz (47.2 kg)  05/22/15 101 lb (45.8 kg)     General appearance: Frail, thin, African-American woman in a wheelchair HENNT: Chronic left eye ptosis. Arcus senilis. Pharynx no erythema, exudate, mass, or ulcer. No thyromegaly or thyroid nodules Lymph nodes: No cervical, supraclavicular, or axillary lymphadenopathy Breasts:  Lungs: Clear to auscultation, resonant to percussion throughout Heart: Regular rhythm, no murmur, no gallop, no rub, no click, no edema Abdomen: Soft, nontender, normal bowel sounds, no mass, no organomegaly, limited exam in the wheelchair Extremities: No edema, no calf tenderness Musculoskeletal: no joint deformities. 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 Stump not examined today. GU:  Vascular: Carotid pulses 2+, no bruits, distal pulses: Dorsalis pedis 1+ symmetric Neurologic: Alert, oriented, PERRLA,  cranial nerves grossly normal, motor strength 5 over 5 upper extremity and right lower extremity. Left above-the-knee amputation., reflexes 1+ symmetric at the biceps, upper body coordination normal, Skin: No rash or ecchymosis  Lab Results: CBC W/Diff    Component Value Date/Time   WBC 12.2 (H) 09/19/2015 0330   RBC 4.53 09/19/2015 0330   HGB 8.8 (L) 09/19/2015 0330   HCT 29.8 (L) 09/19/2015 0330   HCT 41.2 07/10/2015 1141   PLT 1,232 (HH) 09/19/2015 0330   PLT 1,433 (>) 07/10/2015 1141   MCV 65.8 (L) 09/19/2015 0330   MCV 69 (  L) 07/10/2015 1141   MCH 19.4 (L) 09/19/2015 0330   MCHC 29.5 (L) 09/19/2015 0330   RDW 22.2 (H) 09/19/2015 0330   RDW 19.5 (H) 07/10/2015 1141   LYMPHSABS 2.2 09/17/2015 1640   MONOABS 0.6 09/17/2015 1640   EOSABS 0.4 09/17/2015 1640   BASOSABS 0.1  09/17/2015 1640     Chemistry      Component Value Date/Time   NA 140 09/19/2015 0330   NA 147 (H) 09/17/2015 1640   K 3.8 09/19/2015 0330   CL 108 09/19/2015 0330   CO2 25 09/19/2015 0330   BUN 37 (H) 09/19/2015 0330   BUN 44 (H) 09/17/2015 1640   CREATININE 1.12 (H) 09/19/2015 0330      Component Value Date/Time   CALCIUM 8.6 (L) 09/19/2015 0330   ALKPHOS 52 09/18/2015 0131   AST 20 09/18/2015 0131   ALT 17 09/18/2015 0131   BILITOT 0.4 09/18/2015 0131   BILITOT <0.2 09/17/2015 1640    Lab pending July 31   Radiological Studies: No results found.  Impression:  #1. Essential thrombocythemia Now on Hydrea. I will titrate dose to get platelet count below 500,000. Continue antiplatelet agent (Plavix). Management discussed with her granddaughter who is her main caregiver and accompanies her today.  #2. History of a right MCA stroke December 2016 Likely related to #1 above. She does have other risk factors for vascular disease as well as.  #3. Iron deficiency anemia. Likely multifactorial. No gross bleeding. Too frail for a colonoscopy. We will assess the effects of oral iron replacement. Consider cologuard testing.   CC: Patient Care Team: Axel Filler, MD as PCP - General (Internal Medicine)   Annia Belt, MD 7/31/20174:41 PM

## 2015-10-23 DIAGNOSIS — R531 Weakness: Secondary | ICD-10-CM | POA: Diagnosis not present

## 2015-10-24 ENCOUNTER — Encounter: Payer: Self-pay | Admitting: Oncology

## 2015-10-24 ENCOUNTER — Encounter (HOSPITAL_BASED_OUTPATIENT_CLINIC_OR_DEPARTMENT_OTHER): Payer: Medicare HMO | Attending: Surgery

## 2015-10-24 ENCOUNTER — Other Ambulatory Visit: Payer: Self-pay | Admitting: Oncology

## 2015-10-24 DIAGNOSIS — F0390 Unspecified dementia without behavioral disturbance: Secondary | ICD-10-CM | POA: Diagnosis not present

## 2015-10-24 DIAGNOSIS — I10 Essential (primary) hypertension: Secondary | ICD-10-CM | POA: Insufficient documentation

## 2015-10-24 DIAGNOSIS — I739 Peripheral vascular disease, unspecified: Secondary | ICD-10-CM | POA: Diagnosis not present

## 2015-10-24 DIAGNOSIS — L89613 Pressure ulcer of right heel, stage 3: Secondary | ICD-10-CM | POA: Insufficient documentation

## 2015-10-24 DIAGNOSIS — I70221 Atherosclerosis of native arteries of extremities with rest pain, right leg: Secondary | ICD-10-CM | POA: Diagnosis not present

## 2015-10-24 DIAGNOSIS — L97512 Non-pressure chronic ulcer of other part of right foot with fat layer exposed: Secondary | ICD-10-CM | POA: Diagnosis not present

## 2015-10-24 DIAGNOSIS — D649 Anemia, unspecified: Secondary | ICD-10-CM | POA: Diagnosis not present

## 2015-10-24 DIAGNOSIS — L97519 Non-pressure chronic ulcer of other part of right foot with unspecified severity: Secondary | ICD-10-CM | POA: Diagnosis not present

## 2015-10-24 DIAGNOSIS — R69 Illness, unspecified: Secondary | ICD-10-CM | POA: Diagnosis not present

## 2015-10-24 DIAGNOSIS — D473 Essential (hemorrhagic) thrombocythemia: Secondary | ICD-10-CM

## 2015-10-24 DIAGNOSIS — D471 Chronic myeloproliferative disease: Secondary | ICD-10-CM

## 2015-10-24 DIAGNOSIS — R531 Weakness: Secondary | ICD-10-CM | POA: Diagnosis not present

## 2015-10-24 LAB — CBC WITH DIFFERENTIAL/PLATELET
Basophils Absolute: 0.1 10*3/uL (ref 0.0–0.2)
Basos: 2 %
EOS (ABSOLUTE): 0.3 10*3/uL (ref 0.0–0.4)
EOS: 4 %
HEMOGLOBIN: 11.2 g/dL (ref 11.1–15.9)
Hematocrit: 38 % (ref 34.0–46.6)
IMMATURE GRANULOCYTES: 0 %
Immature Grans (Abs): 0 10*3/uL (ref 0.0–0.1)
Lymphocytes Absolute: 2 10*3/uL (ref 0.7–3.1)
Lymphs: 23 %
MCH: 20 pg — ABNORMAL LOW (ref 26.6–33.0)
MCHC: 29.5 g/dL — AB (ref 31.5–35.7)
MCV: 68 fL — ABNORMAL LOW (ref 79–97)
MONOS ABS: 0.5 10*3/uL (ref 0.1–0.9)
Monocytes: 6 %
NEUTROS PCT: 65 %
Neutrophils Absolute: 5.7 10*3/uL (ref 1.4–7.0)
PLATELETS: 1341 10*3/uL — AB (ref 150–379)
RBC: 5.59 x10E6/uL — AB (ref 3.77–5.28)
RDW: 23.1 % — AB (ref 12.3–15.4)
WBC: 8.7 10*3/uL (ref 3.4–10.8)

## 2015-10-24 MED ORDER — HYDROXYUREA 500 MG PO CAPS: 1000.0000 mg | ORAL_CAPSULE | Freq: Every day | ORAL | 4 refills | Status: DC

## 2015-10-24 NOTE — Progress Notes (Signed)
Pt grand-daughter, primary caregiver, called. Instructed to increase Hydrea to 2 capsules = 1,000 mg daily as no decrease in platelet count after 10 days on 500 mg daily.

## 2015-10-25 DIAGNOSIS — R531 Weakness: Secondary | ICD-10-CM | POA: Diagnosis not present

## 2015-10-27 DIAGNOSIS — I129 Hypertensive chronic kidney disease with stage 1 through stage 4 chronic kidney disease, or unspecified chronic kidney disease: Secondary | ICD-10-CM | POA: Diagnosis not present

## 2015-10-27 DIAGNOSIS — L89613 Pressure ulcer of right heel, stage 3: Secondary | ICD-10-CM | POA: Diagnosis not present

## 2015-10-27 DIAGNOSIS — G35 Multiple sclerosis: Secondary | ICD-10-CM | POA: Diagnosis not present

## 2015-10-27 DIAGNOSIS — L89893 Pressure ulcer of other site, stage 3: Secondary | ICD-10-CM | POA: Diagnosis not present

## 2015-10-27 DIAGNOSIS — R69 Illness, unspecified: Secondary | ICD-10-CM | POA: Diagnosis not present

## 2015-10-27 DIAGNOSIS — I70221 Atherosclerosis of native arteries of extremities with rest pain, right leg: Secondary | ICD-10-CM | POA: Diagnosis not present

## 2015-10-27 DIAGNOSIS — Z89512 Acquired absence of left leg below knee: Secondary | ICD-10-CM | POA: Diagnosis not present

## 2015-10-27 DIAGNOSIS — I69354 Hemiplegia and hemiparesis following cerebral infarction affecting left non-dominant side: Secondary | ICD-10-CM | POA: Diagnosis not present

## 2015-10-27 DIAGNOSIS — N183 Chronic kidney disease, stage 3 (moderate): Secondary | ICD-10-CM | POA: Diagnosis not present

## 2015-10-27 DIAGNOSIS — E440 Moderate protein-calorie malnutrition: Secondary | ICD-10-CM | POA: Diagnosis not present

## 2015-10-28 DIAGNOSIS — I6789 Other cerebrovascular disease: Secondary | ICD-10-CM | POA: Diagnosis not present

## 2015-10-28 DIAGNOSIS — G35 Multiple sclerosis: Secondary | ICD-10-CM | POA: Diagnosis not present

## 2015-10-28 DIAGNOSIS — I69354 Hemiplegia and hemiparesis following cerebral infarction affecting left non-dominant side: Secondary | ICD-10-CM | POA: Diagnosis not present

## 2015-10-29 DIAGNOSIS — R531 Weakness: Secondary | ICD-10-CM | POA: Diagnosis not present

## 2015-10-30 DIAGNOSIS — R531 Weakness: Secondary | ICD-10-CM | POA: Diagnosis not present

## 2015-10-31 DIAGNOSIS — R531 Weakness: Secondary | ICD-10-CM | POA: Diagnosis not present

## 2015-11-01 DIAGNOSIS — R531 Weakness: Secondary | ICD-10-CM | POA: Diagnosis not present

## 2015-11-02 ENCOUNTER — Encounter: Payer: Self-pay | Admitting: Student in an Organized Health Care Education/Training Program

## 2015-11-02 DIAGNOSIS — R531 Weakness: Secondary | ICD-10-CM | POA: Diagnosis not present

## 2015-11-02 DIAGNOSIS — Z7189 Other specified counseling: Secondary | ICD-10-CM | POA: Insufficient documentation

## 2015-11-05 DIAGNOSIS — R531 Weakness: Secondary | ICD-10-CM | POA: Diagnosis not present

## 2015-11-06 DIAGNOSIS — R531 Weakness: Secondary | ICD-10-CM | POA: Diagnosis not present

## 2015-11-07 DIAGNOSIS — R531 Weakness: Secondary | ICD-10-CM | POA: Diagnosis not present

## 2015-11-08 DIAGNOSIS — R531 Weakness: Secondary | ICD-10-CM | POA: Diagnosis not present

## 2015-11-09 DIAGNOSIS — R531 Weakness: Secondary | ICD-10-CM | POA: Diagnosis not present

## 2015-11-12 DIAGNOSIS — R531 Weakness: Secondary | ICD-10-CM | POA: Diagnosis not present

## 2015-11-13 DIAGNOSIS — R531 Weakness: Secondary | ICD-10-CM | POA: Diagnosis not present

## 2015-11-14 DIAGNOSIS — R531 Weakness: Secondary | ICD-10-CM | POA: Diagnosis not present

## 2015-11-15 DIAGNOSIS — R531 Weakness: Secondary | ICD-10-CM | POA: Diagnosis not present

## 2015-11-21 ENCOUNTER — Emergency Department (HOSPITAL_COMMUNITY): Payer: Medicare HMO

## 2015-11-21 ENCOUNTER — Encounter (HOSPITAL_COMMUNITY): Payer: Self-pay

## 2015-11-21 ENCOUNTER — Emergency Department (HOSPITAL_COMMUNITY)
Admission: EM | Admit: 2015-11-21 | Discharge: 2015-11-21 | Disposition: A | Discharge status: Home / Self Care | Payer: Medicare HMO | Attending: Emergency Medicine | Admitting: Emergency Medicine

## 2015-11-21 ENCOUNTER — Other Ambulatory Visit: Payer: Self-pay

## 2015-11-21 DIAGNOSIS — Z96641 Presence of right artificial hip joint: Secondary | ICD-10-CM | POA: Diagnosis not present

## 2015-11-21 DIAGNOSIS — R4182 Altered mental status, unspecified: Secondary | ICD-10-CM

## 2015-11-21 DIAGNOSIS — Z96642 Presence of left artificial hip joint: Secondary | ICD-10-CM | POA: Diagnosis not present

## 2015-11-21 DIAGNOSIS — Z79899 Other long term (current) drug therapy: Secondary | ICD-10-CM | POA: Diagnosis not present

## 2015-11-21 DIAGNOSIS — I129 Hypertensive chronic kidney disease with stage 1 through stage 4 chronic kidney disease, or unspecified chronic kidney disease: Secondary | ICD-10-CM | POA: Insufficient documentation

## 2015-11-21 DIAGNOSIS — Z8673 Personal history of transient ischemic attack (TIA), and cerebral infarction without residual deficits: Secondary | ICD-10-CM | POA: Diagnosis not present

## 2015-11-21 DIAGNOSIS — N183 Chronic kidney disease, stage 3 (moderate): Secondary | ICD-10-CM | POA: Insufficient documentation

## 2015-11-21 DIAGNOSIS — R9082 White matter disease, unspecified: Secondary | ICD-10-CM | POA: Diagnosis not present

## 2015-11-21 DIAGNOSIS — I6789 Other cerebrovascular disease: Secondary | ICD-10-CM | POA: Diagnosis not present

## 2015-11-21 DIAGNOSIS — E039 Hypothyroidism, unspecified: Secondary | ICD-10-CM | POA: Insufficient documentation

## 2015-11-21 LAB — CBC WITH DIFFERENTIAL/PLATELET
BASOS ABS: 0.1 10*3/uL (ref 0.0–0.1)
Basophils Relative: 3 %
EOS ABS: 0 10*3/uL (ref 0.0–0.7)
Eosinophils Relative: 1 %
HCT: 36.3 % (ref 36.0–46.0)
Hemoglobin: 11.6 g/dL — ABNORMAL LOW (ref 12.0–15.0)
Lymphocytes Relative: 45 %
Lymphs Abs: 1.4 10*3/uL (ref 0.7–4.0)
MCH: 24.2 pg — ABNORMAL LOW (ref 26.0–34.0)
MCHC: 32 g/dL (ref 30.0–36.0)
MCV: 75.8 fL — ABNORMAL LOW (ref 78.0–100.0)
MONO ABS: 0.2 10*3/uL (ref 0.1–1.0)
Monocytes Relative: 5 %
NEUTROS PCT: 46 %
Neutro Abs: 1.3 10*3/uL — ABNORMAL LOW (ref 1.7–7.7)
PLATELETS: UNDETERMINED 10*3/uL (ref 150–400)
RBC: 4.79 MIL/uL (ref 3.87–5.11)
RDW: 31.2 % — ABNORMAL HIGH (ref 11.5–15.5)
WBC: 3 10*3/uL — AB (ref 4.0–10.5)

## 2015-11-21 LAB — COMPREHENSIVE METABOLIC PANEL
ALT: 14 U/L (ref 14–54)
AST: 19 U/L (ref 15–41)
Albumin: 3 g/dL — ABNORMAL LOW (ref 3.5–5.0)
Alkaline Phosphatase: 63 U/L (ref 38–126)
Anion gap: 6 (ref 5–15)
BUN: 33 mg/dL — AB (ref 6–20)
CHLORIDE: 107 mmol/L (ref 101–111)
CO2: 28 mmol/L (ref 22–32)
CREATININE: 1.03 mg/dL — AB (ref 0.44–1.00)
Calcium: 9.3 mg/dL (ref 8.9–10.3)
GFR calc Af Amer: 56 mL/min — ABNORMAL LOW (ref >60)
GFR calc non Af Amer: 48 mL/min — ABNORMAL LOW (ref >60)
Glucose, Bld: 83 mg/dL (ref 65–99)
Potassium: 3.7 mmol/L (ref 3.5–5.1)
SODIUM: 141 mmol/L (ref 135–145)
Total Bilirubin: 0.4 mg/dL (ref 0.3–1.2)
Total Protein: 6.2 g/dL — ABNORMAL LOW (ref 6.5–8.1)

## 2015-11-21 MED ORDER — SODIUM CHLORIDE 0.9 % IV BOLUS (SEPSIS)
500.0000 mL | Freq: Once | INTRAVENOUS | Status: AC
  Administered 2015-11-21: 500 mL via INTRAVENOUS

## 2015-11-21 NOTE — ED Notes (Signed)
Pt given cranberry juice and saltines/honey grahams, per Humberto Seals, RN.

## 2015-11-21 NOTE — ED Notes (Signed)
Granddaughter at bedside, Dr. Stark Jock at bedside.

## 2015-11-21 NOTE — Discharge Instructions (Signed)
Return to the emergency department if you develop new or worsening symptoms.  Continue your medications as before.

## 2015-11-21 NOTE — ED Provider Notes (Signed)
[facility] DEPT Provider Note   CSN: [id number] Arrival date & time: 11/21/15  0831     History   Chief Complaint No chief complaint on file.   HPI Aliscia Sperling is a 82 y.o. female.  Patient is an 82 year old female with past medical history of multiple sclerosis, hypertension, chronic renal insufficiency, and prior CVA. She is brought by EMS for evaluation of mental status change. According to EMS, she was last seen by her family last night at 10:30 and found to be in her normal state of health. This morning, I am told that she was more confused and less responsive. She was then sent here to be evaluated. She denies any chest pain or difficulty breathing. She has no specific complaints at this time.   The history is provided by the patient.    Past Medical History:  Diagnosis Date  . Acute on chronic kidney failure, stage II - III ([place]) 04/13/2015  . Dementia   . Hyperlipidemia   . Hypertension   . Hypothyroidism   . Multiple sclerosis ([place])   . Myeloproliferative disorder ([place]) 10/12/2015   essential thrombocythemia likely present since 02/2015  . Pressure ulcer of right heel, stage 4 ([place]) 04/15/2015  . Severe protein-calorie malnutrition ([place])   . Stroke [facility])    Left sided weakness  . Thrombocytosis ([place]) 04/13/2015    Patient Active Problem List   Diagnosis Date Noted  . Advanced care planning 11/02/2015  . Essential thrombocythemia ([place]) 10/12/2015  . Myeloproliferative disorder ([place]) 10/12/2015  . Iron deficiency anemia 09/17/2015  . Essential hypertension 07/11/2015  . Peripheral vascular disease ([place]) 04/21/2015  . Severe protein-calorie malnutrition ([place]) 04/21/2015  . Pressure ulcer of right heel, stage 4 ([place]) 04/15/2015  . CKD (chronic kidney disease) stage 3, GFR 30-59 ml/min 04/13/2015  . Dementia 02/28/2015  . Hypothyroidism 02/28/2015    Past Surgical History:  Procedure Laterality Date  . AMPUTATION Left 04/25/2015   Procedure: AMPUTATION  ABOVE KNEE;  Surgeon: Rosetta Posner, MD;  Location: [facility];  Service: Vascular;  Laterality: Left;  . CORONARY ARTERY BYPASS GRAFT    . TOTAL HIP ARTHROPLASTY Right   . TOTAL HIP ARTHROPLASTY Left     OB History    No data available       Home Medications    Prior to Admission medications   Medication Sig Start Date End Date Taking? Authorizing Provider  bisacodyl (DULCOLAX) 5 MG EC tablet Take 1 tablet (5 mg total) by mouth daily as needed for moderate constipation. 04/17/15   Theodis Blaze, MD  clopidogrel (PLAVIX) 75 MG tablet Take 1 tablet (75 mg total) by mouth daily. 07/10/15   Milagros Loll, MD  collagenase (SANTYL) ointment Apply topically daily. 09/19/15   Burgess Estelle, MD  feeding supplement, ENSURE ENLIVE, (ENSURE ENLIVE) LIQD Take 237 mLs by mouth 2 (two) times daily between meals. 04/29/15   Belkys A Regalado, MD  ferrous sulfate 325 (65 FE) MG tablet Take 1 tablet (325 mg total) by mouth daily with breakfast. 07/11/15   Milagros Loll, MD  hydroxyurea (HYDREA) 500 MG capsule Take 2 capsules (1,000 mg total) by mouth daily. May take with food to minimize GI side effects. 10/24/15   Annia Belt, MD  levothyroxine (SYNTHROID, LEVOTHROID) 25 MCG tablet Take 2 tablets (50 mcg total) by mouth daily before breakfast. 10/15/15   Axel Filler, MD  Multiple Vitamin (MULTIVITAMIN WITH MINERALS) TABS tablet Take 1 tablet by mouth daily.  Historical Provider, MD  pravastatin (PRAVACHOL) 20 MG tablet Take 1 tablet (20 mg total) by mouth daily at 6 PM. 07/10/15   Milagros Loll, MD  senna-docusate (SENOKOT-S) 8.6-50 MG tablet Take 1 tablet by mouth at bedtime as needed for mild constipation. 04/17/15   Theodis Blaze, MD  Vitamins A & D (VITAMIN A & D) ointment Apply 1 application topically as needed (with every adult brief change.).    Historical Provider, MD    Family History Family History  Problem Relation Age of Onset  . Obesity Other   . Heart attack Brother      Social History Social History  Substance Use Topics  . Smoking status: Never Smoker  . Smokeless tobacco: Never Used  . Alcohol use No     Allergies   Aspirin   Review of Systems Review of Systems  All other systems reviewed and are negative.    Physical Exam Updated Vital Signs BP 177/67   Pulse (!) 47   Temp 97.6 F (36.4 C) (Oral)   Resp 20   SpO2 100%   Physical Exam  Constitutional: She is oriented to person, place, and time. She appears well-developed and well-nourished. No distress.  HENT:  Head: Normocephalic and atraumatic.  Mouth/Throat: Oropharynx is clear and moist.  Neck: Normal range of motion. Neck supple.  Cardiovascular: Normal rate and regular rhythm.  Exam reveals no gallop and no friction rub.   No murmur heard. Pulmonary/Chest: Effort normal and breath sounds normal. No respiratory distress. She has no wheezes.  Abdominal: Soft. Bowel sounds are normal. She exhibits no distension. There is no tenderness.  Musculoskeletal: Normal range of motion.  She is status post left BKA. The right foot is noted to have a dressing in place. There is no significant skin breakdown when the dressing is removed.  Neurological: She is alert and oriented to person, place, and time. No cranial nerve deficit. She exhibits normal muscle tone. Coordination normal.  Skin: Skin is warm and dry. She is not diaphoretic.  Nursing note and vitals reviewed.    ED Treatments / Results  Labs (all labs ordered are listed, but only abnormal results are displayed) Labs Reviewed  CBC WITH DIFFERENTIAL/PLATELET  COMPREHENSIVE METABOLIC PANEL  URINALYSIS, ROUTINE W REFLEX MICROSCOPIC (NOT AT [facility])    EKG ED ECG REPORT   Date: 11/21/2015  Rate: 44  Rhythm: sinus bradycardia  QRS Axis: normal  Intervals: normal  ST/T Wave abnormalities: normal  Conduction Disutrbances:none  Narrative Interpretation:   Old EKG Reviewed: unchanged  I have personally reviewed the  EKG tracing and agree with the computerized printout as noted.   Radiology No results found.  Procedures Procedures (including critical care time)  Medications Ordered in ED Medications  sodium chloride 0.9 % bolus 500 mL (not administered)     Initial Impression / Assessment and Plan / ED Course  I have reviewed the triage vital signs and the nursing notes.  Pertinent labs & imaging results that were available during my care of the patient were reviewed by me and considered in my medical decision making (see chart for details).  Clinical Course    Patient is an 82 year old female brought by EMS for evaluation of mental status change. Her granddaughter who is her primary care giver found her this morning to be altered. She apparently was not responding to her and would not answer her questions. She was concerned so called 911. Her last seen normal was yesterday evening at  10:30, so no code stroke was initiated.  By the time the patient arrives here, her mental status was at her baseline and she has no complaints. Her neurologic exam is nonfocal and speech is clear. Laboratory studies are unremarkable and CT scan reveals no evidence for stroke. I am uncertain as to what caused this episode, however nothing today appears emergent. She was recently admitted for similar complaints and underwent extensive workup. I see no reason for readmission.  She will be discharged with instructions to return as needed for any problems.  Final Clinical Impressions(s) / ED Diagnoses   Final diagnoses:  None    New Prescriptions New Prescriptions   No medications on file     Veryl Speak, MD 11/21/15 1143

## 2015-11-21 NOTE — ED Triage Notes (Signed)
Pt presents with report of nonverbal, not following commands per family when they woke her up this morning.  Last seen normal was 2230 last night.  Per EMS, pt is following commands and is verbal on arrival.  Unknown baseline mental status; h/o CVA with L sided deficit.

## 2015-11-21 NOTE — ED Notes (Signed)
Pt given another cranberry juice, per Martie Round, RN.

## 2015-11-28 DIAGNOSIS — G35 Multiple sclerosis: Secondary | ICD-10-CM | POA: Diagnosis not present

## 2015-11-28 DIAGNOSIS — I69354 Hemiplegia and hemiparesis following cerebral infarction affecting left non-dominant side: Secondary | ICD-10-CM | POA: Diagnosis not present

## 2015-11-28 DIAGNOSIS — I6789 Other cerebrovascular disease: Secondary | ICD-10-CM | POA: Diagnosis not present

## 2015-12-28 DIAGNOSIS — G35 Multiple sclerosis: Secondary | ICD-10-CM | POA: Diagnosis not present

## 2015-12-28 DIAGNOSIS — I6789 Other cerebrovascular disease: Secondary | ICD-10-CM | POA: Diagnosis not present

## 2015-12-28 DIAGNOSIS — I69354 Hemiplegia and hemiparesis following cerebral infarction affecting left non-dominant side: Secondary | ICD-10-CM | POA: Diagnosis not present

## 2016-01-28 ENCOUNTER — Encounter: Payer: Self-pay | Admitting: Student in an Organized Health Care Education/Training Program

## 2016-01-28 ENCOUNTER — Ambulatory Visit (INDEPENDENT_AMBULATORY_CARE_PROVIDER_SITE_OTHER): Payer: Medicare HMO | Admitting: Student in an Organized Health Care Education/Training Program

## 2016-01-28 VITALS — BP 147/69 | HR 59 | Temp 97.4°F

## 2016-01-28 DIAGNOSIS — Z89612 Acquired absence of left leg above knee: Secondary | ICD-10-CM

## 2016-01-28 DIAGNOSIS — I69354 Hemiplegia and hemiparesis following cerebral infarction affecting left non-dominant side: Secondary | ICD-10-CM | POA: Diagnosis not present

## 2016-01-28 DIAGNOSIS — I739 Peripheral vascular disease, unspecified: Secondary | ICD-10-CM

## 2016-01-28 DIAGNOSIS — G35 Multiple sclerosis: Secondary | ICD-10-CM | POA: Diagnosis not present

## 2016-01-28 DIAGNOSIS — E039 Hypothyroidism, unspecified: Secondary | ICD-10-CM

## 2016-01-28 DIAGNOSIS — D473 Essential (hemorrhagic) thrombocythemia: Secondary | ICD-10-CM

## 2016-01-28 DIAGNOSIS — L89614 Pressure ulcer of right heel, stage 4: Secondary | ICD-10-CM

## 2016-01-28 DIAGNOSIS — E034 Atrophy of thyroid (acquired): Secondary | ICD-10-CM

## 2016-01-28 DIAGNOSIS — I6789 Other cerebrovascular disease: Secondary | ICD-10-CM | POA: Diagnosis not present

## 2016-01-28 DIAGNOSIS — F0390 Unspecified dementia without behavioral disturbance: Secondary | ICD-10-CM

## 2016-01-28 DIAGNOSIS — R69 Illness, unspecified: Secondary | ICD-10-CM | POA: Diagnosis not present

## 2016-01-28 DIAGNOSIS — Z66 Do not resuscitate: Secondary | ICD-10-CM

## 2016-01-28 DIAGNOSIS — D500 Iron deficiency anemia secondary to blood loss (chronic): Secondary | ICD-10-CM | POA: Diagnosis not present

## 2016-01-28 DIAGNOSIS — Z23 Encounter for immunization: Secondary | ICD-10-CM

## 2016-01-28 DIAGNOSIS — Z79899 Other long term (current) drug therapy: Secondary | ICD-10-CM

## 2016-01-28 NOTE — Assessment & Plan Note (Signed)
Synthroid was increased in July because of a TSH of 11. Plan to recheck TSH today. She symptomatically is doing well. Plan to continue Synthroid 50 g daily for now.

## 2016-01-28 NOTE — Assessment & Plan Note (Signed)
Patient with essential thrombocythemia and at least two arterial clotting events, currently doing well on hydroxyurea 1000 mg daily along with Plavix. Last platelet count was 1.4 million in July. We're going to check her CBC again today, I confirmed with Dr. Darnell Level that she should be having at least monthly CBC checks which I can arrange. Plan to continue hydroxyurea and Plavix at current dosing.

## 2016-01-28 NOTE — Progress Notes (Signed)
Assessment and Plan:  See Encounters tab for problem-based medical decision making.   __________________________________________________________  HPI:  82 year old woman here for follow-up of dementia. The patient lives at home with her husband and she is cared for by her granddaughter. Her husband is also severely disabled. The patient has essential thrombocythemia which has led to an arterial stroke as well as arterial clot of her left leg leading to an above-knee amputation about 1 year ago. She requires assistance in most of her activities of daily living. She can feed herself, but needs help with using the bathroom, cleaning, dressing, etc. Her granddaughter is very dedicated to keeping the patient and her husband comfortable in their home. I referred them to home palliative care services in August and they had one home visit. It sounds like she's doing fairly well at home and so the palliative care team has made themselves available as needed if she has a functional decline. Granddaughter has hired a [employer] representative. Patient has no complaints today, she is very pleasantly demented and does not give me much history because of dementia. [name] tells me that her grandmother's doing fairly well at home, no complaints. Reports good compliance with her medications. Does endorse that her stools have been very dark. No recent fevers or chills. She thinks the wound on her right lower leg is getting better, but she is worried that the great toe on the right is changing color and becoming cold.  __________________________________________________________  Problem List: Patient Active Problem List   Diagnosis Date Noted  . Advanced care planning 11/02/2015    Priority: High  . Essential thrombocythemia ([place]) 10/12/2015    Priority: High  . Myeloproliferative disorder ([place]) 10/12/2015    Priority: High  . Peripheral vascular disease ([place]) 04/21/2015    Priority: High  . Severe protein-calorie  malnutrition ([place]) 04/21/2015    Priority: High  . Iron deficiency anemia 09/17/2015    Priority: Medium  . Pressure ulcer of right heel, stage 4 ([place]) 04/15/2015    Priority: Medium  . CKD (chronic kidney disease) stage 3, GFR 30-59 ml/min 04/13/2015    Priority: Medium  . Dementia 02/28/2015    Priority: Medium  . Hypothyroidism 02/28/2015    Priority: Medium  . Essential hypertension 07/11/2015    Priority: Low    Medications: Reconciled today in Epic __________________________________________________________  Physical Exam:  Vital Signs: Vitals:   01/28/16 1108  BP: (!) 147/69  Pulse: (!) 59  Temp: 97.4 F (36.3 C)  TempSrc: Oral    Gen: Chronically ill appearing woman, no distress CV: RRR, no murmurs Pulm: Normal effort, CTA throughout, no wheezing Abd: Soft, NT, ND, normal BS.  Ext: Left AKA, right leg is thin with no edema. Right heal with healing pressure ulcer, probably stage 2-3. Right great toe is cool with darkening of the distal skin and slough of epidermis, no ulcerations.  Skin: No rashes

## 2016-01-28 NOTE — Patient Instructions (Signed)
1. Keep taking all medicines as prescribed. I will send your blood work results this week.   2. Keep the right heel clean and dry, no need for a bandage at this point. The heel protector is a great idea.   3. Call palliative care home services if you have any concerns for another home visit.   4. Try to bring your grandmother in once a month for blood work, which I can send to Dr. Darnell Level.

## 2016-01-28 NOTE — Assessment & Plan Note (Signed)
Right great toe shows signs of chronic ischemia with darkened skin, it's very cold, and some early ulcerations of the distal toe. I think this is likely related to the chronic vascular disease and not an acute arterial embolism. I talked to her granddaughter about supportive care. We decided to watch for infection. Plan to continue medical therapy with pravastatin and Plavix. Unfortunately there is not much more we can do for this foot, I gave her instructions to watch for early signs of infection.

## 2016-01-28 NOTE — Assessment & Plan Note (Signed)
Microcytic anemia with hemoglobin around 11, ferritin of 11. Likely source of blood loss is from GI tract. She has been on oral iron supplementation for several months, now reporting dark stools as expected. Plan to recheck CBC and ferritin today. I think she is still too frail to be able to undergo a colonoscopy and hopefully we can supplement her iron levels adequately with oral repletion.

## 2016-01-28 NOTE — Assessment & Plan Note (Signed)
Heel ulcer is responding nicely to current treatment. I agree with no dressing, just heal guard, and boot. Granddaughter is doing a good job with positioning so the pressure is relieved.

## 2016-01-28 NOTE — Assessment & Plan Note (Signed)
Symptomatically stable with chronically reduced functional state. Requires help with most ADLs including toiletting, dressing, and feeding. Granddaughter takes care of her, they have a private duty assistance as well. She started home palliative care services in August. Swallowing is doing well currently. Things seem stable at home, goal is to keep her comfortable at home for as long as possible. Plan to continue palliative care and comfort oriented measures. DNR/DNI. No role for namenda here.

## 2016-01-29 ENCOUNTER — Telehealth: Payer: Self-pay

## 2016-01-29 ENCOUNTER — Telehealth: Payer: Self-pay | Admitting: Student in an Organized Health Care Education/Training Program

## 2016-01-29 DIAGNOSIS — D473 Essential (hemorrhagic) thrombocythemia: Secondary | ICD-10-CM

## 2016-01-29 LAB — CBC
Hematocrit: 20.2 % — ABNORMAL LOW (ref 34.0–46.6)
Hemoglobin: 7 g/dL — CL (ref 11.1–15.9)
MCH: 31 pg (ref 26.6–33.0)
MCHC: 34.7 g/dL (ref 31.5–35.7)
MCV: 89 fL (ref 79–97)
PLATELETS: 118 10*3/uL — AB (ref 150–379)
RBC: 2.26 x10E6/uL — CL (ref 3.77–5.28)
RDW: 35.5 % — AB (ref 12.3–15.4)
WBC: 2.4 10*3/uL — AB (ref 3.4–10.8)

## 2016-01-29 LAB — FERRITIN: Ferritin: 54 ng/mL (ref 15–150)

## 2016-01-29 LAB — URIC ACID: Uric Acid: 5.6 mg/dL (ref 2.5–7.1)

## 2016-01-29 LAB — TSH: TSH: 5.03 u[IU]/mL — ABNORMAL HIGH (ref 0.450–4.500)

## 2016-01-29 NOTE — Telephone Encounter (Signed)
I have ordered standing weekly CBC for 6 weeks to monitor hydroxyurea effects.

## 2016-01-29 NOTE — Telephone Encounter (Signed)
I spoke with the patient's care taker [name] (granddaughter) about lab results from yesterday's visit. CBC shows new pancytopenia consistent with robust effect from hydroxyurea. Last dose was yesterday. I asked [name] to hold hydroxyurea today. I will talk with Dr. Darnell Level and we will need to reduce the dose of hydroxyurea after some time of holding it. The patient is her usual self, I advised [name] to call us if she notices any new changes like reduced interaction or somnolence or fever.

## 2016-01-29 NOTE — Telephone Encounter (Signed)
Great. I spoke with [name] by phone, gave her the update to change hydroxyurea to 500mg  once daily and bring patient in on Monday for CBC check. Orders are placed.

## 2016-01-29 NOTE — Telephone Encounter (Signed)
I spoke with Dr. Darnell Level and we decided to decrease hydroxyurea from 1000mg  daily to 500mg  daily. I called and left a VM with these instructions. I placed an order for a repeat CBC to be done in one week. Dr. Darnell Level would like her to have CBCs weekly until we find the stable dose of hydroxyurea.

## 2016-01-29 NOTE — Telephone Encounter (Signed)
Requesting lab result. 

## 2016-01-29 NOTE — Addendum Note (Signed)
Addended by: Lalla Brothers T on: 01/29/2016 01:41 PM   Modules accepted: Orders

## 2016-02-05 ENCOUNTER — Telehealth: Payer: Self-pay

## 2016-02-05 NOTE — Telephone Encounter (Signed)
Information requested given to pt.

## 2016-02-05 NOTE — Telephone Encounter (Signed)
Pt granddaughter needs to speak with a nurse regarding pt. Please call back.

## 2016-02-26 ENCOUNTER — Telehealth: Payer: Self-pay | Admitting: Student in an Organized Health Care Education/Training Program

## 2016-02-26 NOTE — Telephone Encounter (Signed)
Notified by lab that patient is well overdue for follow up CBC to monitor response to Hydroxurea, which she takes for essential thrombocythemia. I called and spoke with granddaughter [name] (caretaker). She will bring patient in to lab this week. Unfortunately the patient's husband, Oretha Caprice, died on 02/25/23 causing a lot of stress in their home. I gave my condolences. We will follow up her CBC this week once obtained.

## 2016-02-27 DIAGNOSIS — I6789 Other cerebrovascular disease: Secondary | ICD-10-CM | POA: Diagnosis not present

## 2016-02-27 DIAGNOSIS — I69354 Hemiplegia and hemiparesis following cerebral infarction affecting left non-dominant side: Secondary | ICD-10-CM | POA: Diagnosis not present

## 2016-02-27 DIAGNOSIS — G35 Multiple sclerosis: Secondary | ICD-10-CM | POA: Diagnosis not present

## 2016-02-29 ENCOUNTER — Other Ambulatory Visit (INDEPENDENT_AMBULATORY_CARE_PROVIDER_SITE_OTHER): Payer: Medicare HMO

## 2016-02-29 DIAGNOSIS — D473 Essential (hemorrhagic) thrombocythemia: Secondary | ICD-10-CM | POA: Diagnosis not present

## 2016-02-29 LAB — COMPREHENSIVE METABOLIC PANEL
ALT: 13 U/L — ABNORMAL LOW (ref 14–54)
ANION GAP: 11 (ref 5–15)
AST: 23 U/L (ref 15–41)
Albumin: 3.2 g/dL — ABNORMAL LOW (ref 3.5–5.0)
Alkaline Phosphatase: 78 U/L (ref 38–126)
BILIRUBIN TOTAL: 0.4 mg/dL (ref 0.3–1.2)
BUN: 35 mg/dL — ABNORMAL HIGH (ref 6–20)
CO2: 23 mmol/L (ref 22–32)
Calcium: 9.8 mg/dL (ref 8.9–10.3)
Chloride: 111 mmol/L (ref 101–111)
Creatinine, Ser: 1.24 mg/dL — ABNORMAL HIGH (ref 0.44–1.00)
GFR, EST AFRICAN AMERICAN: 44 mL/min — AB (ref >60)
GFR, EST NON AFRICAN AMERICAN: 38 mL/min — AB (ref >60)
Glucose, Bld: 104 mg/dL — ABNORMAL HIGH (ref 65–99)
POTASSIUM: 3.6 mmol/L (ref 3.5–5.1)
Sodium: 145 mmol/L (ref 135–145)
TOTAL PROTEIN: 7.1 g/dL (ref 6.5–8.1)

## 2016-02-29 LAB — CBC WITH DIFFERENTIAL/PLATELET
BASOS ABS: 0 10*3/uL (ref 0.0–0.1)
Basophils Relative: 0 %
Eosinophils Absolute: 0.1 10*3/uL (ref 0.0–0.7)
Eosinophils Relative: 1 %
HEMATOCRIT: 31.1 % — AB (ref 36.0–46.0)
HEMOGLOBIN: 9.7 g/dL — AB (ref 12.0–15.0)
LYMPHS ABS: 1.9 10*3/uL (ref 0.7–4.0)
LYMPHS PCT: 31 %
MCH: 32.6 pg (ref 26.0–34.0)
MCHC: 31.2 g/dL (ref 30.0–36.0)
MCV: 104.4 fL — ABNORMAL HIGH (ref 78.0–100.0)
MONOS PCT: 9 %
Monocytes Absolute: 0.5 10*3/uL (ref 0.1–1.0)
Neutro Abs: 3.6 10*3/uL (ref 1.7–7.7)
Neutrophils Relative %: 59 %
Platelets: 452 10*3/uL — ABNORMAL HIGH (ref 150–400)
RBC: 2.98 MIL/uL — AB (ref 3.87–5.11)
RDW: 21.2 % — AB (ref 11.5–15.5)
WBC: 6.1 10*3/uL (ref 4.0–10.5)

## 2016-02-29 NOTE — Addendum Note (Signed)
Addended by: Truddie Crumble on: 02/29/2016 02:45 PM   Modules accepted: Orders

## 2016-03-03 ENCOUNTER — Telehealth: Payer: Self-pay | Admitting: Student in an Organized Health Care Education/Training Program

## 2016-03-03 DIAGNOSIS — D471 Chronic myeloproliferative disease: Secondary | ICD-10-CM

## 2016-03-03 DIAGNOSIS — D473 Essential (hemorrhagic) thrombocythemia: Secondary | ICD-10-CM

## 2016-03-03 MED ORDER — HYDROXYUREA 500 MG PO CAPS: 500.0000 mg | ORAL_CAPSULE | Freq: Every day | ORAL | 4 refills | Status: DC

## 2016-03-03 NOTE — Telephone Encounter (Signed)
I spoke with Rachel [name] by the phone, CBC looks better with improved leukopenia. Platelets are a little elevated at 452,000. Current dose of Hydrea is 500 mg once daily. I will talk with Dr. Darnell Level to see if we need to adjust the dose and when the next lab check should be.

## 2016-03-04 NOTE — Addendum Note (Signed)
Addended by: Lalla Brothers T on: 03/04/2016 03:58 PM   Modules accepted: Orders

## 2016-03-04 NOTE — Telephone Encounter (Signed)
I spoke with Dr. Darnell Level who wants to increase Hydrea to 1000 mg MWF, and 500 mg every other day. I called [name] and let her know about the change, she understood. I ordered repeat CBC for two weeks to monitor. She had no questions.

## 2016-03-25 ENCOUNTER — Other Ambulatory Visit (INDEPENDENT_AMBULATORY_CARE_PROVIDER_SITE_OTHER): Payer: Medicare HMO

## 2016-03-25 DIAGNOSIS — D473 Essential (hemorrhagic) thrombocythemia: Secondary | ICD-10-CM | POA: Diagnosis not present

## 2016-03-25 LAB — CBC
HEMATOCRIT: 37.7 % (ref 36.0–46.0)
Hemoglobin: 12.1 g/dL (ref 12.0–15.0)
MCH: 33.2 pg (ref 26.0–34.0)
MCHC: 32.1 g/dL (ref 30.0–36.0)
MCV: 103.3 fL — AB (ref 78.0–100.0)
Platelets: 161 10*3/uL (ref 150–400)
RBC: 3.65 MIL/uL — ABNORMAL LOW (ref 3.87–5.11)
RDW: 16.3 % — AB (ref 11.5–15.5)
WBC: 3.3 10*3/uL — AB (ref 4.0–10.5)

## 2016-03-26 ENCOUNTER — Telehealth: Payer: Self-pay | Admitting: Student in an Organized Health Care Education/Training Program

## 2016-03-26 NOTE — Telephone Encounter (Signed)
Hi Dr. Darnell Level. Can you take a look at this repeat CBC? Two weeks ago we increased Hydrea to 1000mg  MWF and 500mg  other days. Platelets now good but a little leukopenia. Would you suggest any further changes to the Hydrea dosing?  Thanks!

## 2016-03-26 NOTE — Telephone Encounter (Signed)
Rachel [name] - I am actually OK with the leukopenia given the significant rise in her Hb and platelets now in normal range. Would continue same dose. Check counts again in one month. Tweak dose then if any significant change. Thanks Jannifer Hick

## 2016-03-26 NOTE — Telephone Encounter (Signed)
I called patient and left VM to continue current dosing of Hydrea. I will see her in follow up on 2/19 and we will repeat CBC then.

## 2016-04-30 DIAGNOSIS — R531 Weakness: Secondary | ICD-10-CM | POA: Diagnosis not present

## 2016-05-12 ENCOUNTER — Encounter: Payer: Self-pay | Admitting: Student in an Organized Health Care Education/Training Program

## 2016-05-12 ENCOUNTER — Encounter (INDEPENDENT_AMBULATORY_CARE_PROVIDER_SITE_OTHER): Payer: Self-pay

## 2016-05-12 ENCOUNTER — Ambulatory Visit (INDEPENDENT_AMBULATORY_CARE_PROVIDER_SITE_OTHER): Payer: Medicare HMO | Admitting: Student in an Organized Health Care Education/Training Program

## 2016-05-12 VITALS — BP 180/67 | HR 56 | Temp 97.5°F | Ht 64.0 in

## 2016-05-12 DIAGNOSIS — D473 Essential (hemorrhagic) thrombocythemia: Secondary | ICD-10-CM | POA: Diagnosis not present

## 2016-05-12 DIAGNOSIS — Z7902 Long term (current) use of antithrombotics/antiplatelets: Secondary | ICD-10-CM | POA: Diagnosis not present

## 2016-05-12 DIAGNOSIS — Z79899 Other long term (current) drug therapy: Secondary | ICD-10-CM | POA: Diagnosis not present

## 2016-05-12 DIAGNOSIS — I10 Essential (primary) hypertension: Secondary | ICD-10-CM | POA: Diagnosis not present

## 2016-05-12 DIAGNOSIS — L89612 Pressure ulcer of right heel, stage 2: Secondary | ICD-10-CM

## 2016-05-12 DIAGNOSIS — Z89612 Acquired absence of left leg above knee: Secondary | ICD-10-CM

## 2016-05-12 DIAGNOSIS — E034 Atrophy of thyroid (acquired): Secondary | ICD-10-CM

## 2016-05-12 DIAGNOSIS — E039 Hypothyroidism, unspecified: Secondary | ICD-10-CM

## 2016-05-12 MED ORDER — LEVOTHYROXINE SODIUM 50 MCG PO TABS: 50.0000 ug | ORAL_TABLET | Freq: Every day | ORAL | 3 refills | Status: DC

## 2016-05-12 MED ORDER — CLOPIDOGREL BISULFATE 75 MG PO TABS: 75.0000 mg | ORAL_TABLET | Freq: Every day | ORAL | 3 refills | Status: DC

## 2016-05-12 MED ORDER — PRAVASTATIN SODIUM 20 MG PO TABS: 20.0000 mg | ORAL_TABLET | Freq: Every day | ORAL | 3 refills | Status: DC

## 2016-05-12 MED ORDER — AMLODIPINE BESYLATE 5 MG PO TABS: 5.0000 mg | ORAL_TABLET | Freq: Every day | ORAL | 3 refills | Status: DC

## 2016-05-12 NOTE — Assessment & Plan Note (Signed)
Previously stage IV due to overall deconditioning and disability. She has been following with wound center and this is improving nicely. I would say it is a stage II ulcer now without any signs of infection. Plan to continue to keep it clean and dry and continue the offloading boots.

## 2016-05-12 NOTE — Assessment & Plan Note (Signed)
Symptomatically stable. Last TSH was 5 which is fine. Plan to continue Synthroid 50 g daily.

## 2016-05-12 NOTE — Assessment & Plan Note (Signed)
Previously hypertensive management with amlodipine and lisinopril. Had admission in June with syncope and relatively low blood pressures. This was in the setting of very poor oral intake, antihypertensives were stopped that admission. Over the last few months her oral intake and hydration have much improved. Last several visits her blood pressures have ranged from 150-180. She's not symptomatic from it, but I would like to lower her blood pressure below 150 to reduce her risk for hemorrhagic stroke. Plan to restart amlodipine 5 mg daily. Family will monitor her blood pressure carefully at home and call me if she has any problems.

## 2016-05-12 NOTE — Patient Instructions (Signed)
1. Keep your medicines as you are taking.   2. We will restart Amlodipine 5mg  once a day for high blood pressure.

## 2016-05-12 NOTE — Assessment & Plan Note (Signed)
Doing well on hydroxyurea 1000 mg Monday Wednesday Friday and 500 mg every other day. We will check CBC today. She follows with Dr. Darnell Level for dose adjustment. Continue with current dosing for now. I'll also arrange for her to get another CBC next month and then follow-up with me again in the month after.

## 2016-05-12 NOTE — Progress Notes (Signed)
Assessment and Plan:  See Encounters tab for problem-based medical decision making.   __________________________________________________________  HPI:  83 year old woman with advanced dementia here for follow-up of essential thrombocythemia. Patient is doing well on plavix and hydroxyurea. She is accompanied today by her granddaughter Rachel [name] who is her caretaker. The family has also hired a Dispensing optician to be with [name] during the day. Since her last visit her husband has passed away but the patient does not remember this. She has no acute complaints. She thinks things are going fine at home. E tells me that she is doing very well. She is eating and drinking better. She is having one bowel movement per day. She still needs help with all her activities of daily living. Good compliance with medications. No new wounds or ulcers on her skin. She does report having very cool hands consistently throughout the day.  __________________________________________________________  Problem List: Patient Active Problem List   Diagnosis Date Noted  . Advanced care planning 11/02/2015    Priority: High  . Essential thrombocythemia ([place]) 10/12/2015    Priority: High  . Myeloproliferative disorder ([place]) 10/12/2015    Priority: High  . Peripheral vascular disease ([place]) 04/21/2015    Priority: High  . Severe protein-calorie malnutrition ([place]) 04/21/2015    Priority: High  . Iron deficiency anemia 09/17/2015    Priority: Medium  . Pressure ulcer 04/15/2015    Priority: Medium  . CKD (chronic kidney disease) stage 3, GFR 30-59 ml/min 04/13/2015    Priority: Medium  . Dementia 02/28/2015    Priority: Medium  . Hypothyroidism 02/28/2015    Priority: Medium  . Essential hypertension 07/11/2015    Priority: Low    Medications: Reconciled today in Epic __________________________________________________________  Physical Exam:  Vital Signs: Vitals:   05/12/16 1125  BP: (!)  180/67  Pulse: (!) 56  Temp: 97.5 F (36.4 C)  TempSrc: Oral  SpO2: 98%  Height: 5\' 4"  (1.626 m)    Gen: Chronically ill-appearing woman, in a wheelchair CV: RRR, 2/6 early systolic murmur at RUSB Pulm: Normal effort, CTA throughout, no wheezing Abd: Soft, NT, ND, normal BS.  Ext: Hands and foot are cool to the touch. Right heel with a stage 2 ulcer without errythema or purulence. 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

## 2016-05-13 ENCOUNTER — Telehealth: Payer: Self-pay | Admitting: Student in an Organized Health Care Education/Training Program

## 2016-05-13 ENCOUNTER — Other Ambulatory Visit: Payer: Self-pay | Admitting: Oncology

## 2016-05-13 DIAGNOSIS — D473 Essential (hemorrhagic) thrombocythemia: Secondary | ICD-10-CM

## 2016-05-13 DIAGNOSIS — D471 Chronic myeloproliferative disease: Secondary | ICD-10-CM

## 2016-05-13 LAB — CBC
Hematocrit: 35.4 % (ref 34.0–46.6)
Hemoglobin: 11.8 g/dL (ref 11.1–15.9)
MCH: 32.1 pg (ref 26.6–33.0)
MCHC: 33.3 g/dL (ref 31.5–35.7)
MCV: 96 fL (ref 79–97)
PLATELETS: 143 10*3/uL — AB (ref 150–379)
RBC: 3.68 x10E6/uL — ABNORMAL LOW (ref 3.77–5.28)
RDW: 17.3 % — AB (ref 12.3–15.4)
WBC: 3.8 10*3/uL (ref 3.4–10.8)

## 2016-05-13 MED ORDER — HYDROXYUREA 500 MG PO CAPS: 500.0000 mg | ORAL_CAPSULE | Freq: Every day | ORAL | 4 refills | Status: DC

## 2016-05-13 NOTE — Telephone Encounter (Signed)
Monthly CBC shows platelets down to 141K. I spoke with Dr. Darnell Level and plan is to decrease hydroxyurea to 1000mg  on Monday and 500mg  every other day. I called her granddaughter [name] who will make the adjustment. Next CBC in one month.

## 2016-07-24 NOTE — Addendum Note (Signed)
Addended by: Truddie Crumble on: 07/24/2016 03:47 PM   Modules accepted: Orders

## 2016-08-07 ENCOUNTER — Ambulatory Visit: Payer: Medicare HMO

## 2016-08-20 ENCOUNTER — Ambulatory Visit (INDEPENDENT_AMBULATORY_CARE_PROVIDER_SITE_OTHER): Payer: Medicare HMO | Admitting: Internal Medicine

## 2016-08-20 ENCOUNTER — Encounter: Payer: Self-pay | Admitting: Internal Medicine

## 2016-08-20 VITALS — BP 152/78 | HR 70 | Temp 97.9°F

## 2016-08-20 DIAGNOSIS — F0390 Unspecified dementia without behavioral disturbance: Secondary | ICD-10-CM | POA: Diagnosis not present

## 2016-08-20 DIAGNOSIS — D473 Essential (hemorrhagic) thrombocythemia: Secondary | ICD-10-CM | POA: Diagnosis not present

## 2016-08-20 DIAGNOSIS — L89612 Pressure ulcer of right heel, stage 2: Secondary | ICD-10-CM

## 2016-08-20 DIAGNOSIS — Z8673 Personal history of transient ischemic attack (TIA), and cerebral infarction without residual deficits: Secondary | ICD-10-CM | POA: Diagnosis not present

## 2016-08-20 DIAGNOSIS — R69 Illness, unspecified: Secondary | ICD-10-CM | POA: Diagnosis not present

## 2016-08-20 DIAGNOSIS — Z89612 Acquired absence of left leg above knee: Secondary | ICD-10-CM | POA: Diagnosis not present

## 2016-08-20 DIAGNOSIS — I6930 Unspecified sequelae of cerebral infarction: Secondary | ICD-10-CM

## 2016-08-20 NOTE — Assessment & Plan Note (Signed)
On hydrea. Will check CBC and CMET today.

## 2016-08-20 NOTE — Progress Notes (Signed)
   CC: open wound right heel  HPI:  [name] is a 83 y.o.   Has hx of essential thrombocthemia with arterial stroke and arterial clot of her left leg leading to an above-knee amputation   Had pressure ulcer on right heel long term which was stage 4 in the past but recently has been healing well. She was wearing the off loading boot but it had worn off and has not been wearing it. Over the weekend she noticed the area started opening up and draining yellow drainage. No fevers. Has pain over the heel.   Past Medical History:  Diagnosis Date  . Dementia   . Hyperlipidemia   . Hypertension   . Hypothyroidism   . Myeloproliferative disorder ([place]) 10/12/2015   essential thrombocythemia likely present since 02/2015  . Pressure ulcer of right heel, stage 4 ([place]) 04/15/2015  . Severe protein-calorie malnutrition ([place])   . Stroke [facility])    Left sided weakness  . Thrombocytosis ([place]) 04/13/2015    Review of Systems:   Review of Systems  Constitutional: Negative for chills and fever.  Respiratory: Negative for sputum production.   Cardiovascular: Negative for chest pain.  Gastrointestinal: Negative for nausea and vomiting.  Neurological: Negative for dizziness and headaches.     Physical Exam:  Vitals:   08/20/16 1533  BP: (!) 152/78  Pulse: 70  Temp: 97.9 F (36.6 C)  TempSrc: Oral  SpO2: 99%   Physical Exam  Constitutional:  Elderly female, sitting in wheel chair, frail.   HENT:  Head: Normocephalic and atraumatic.  Eyes: Conjunctivae are normal.  Cardiovascular: Normal rate and regular rhythm.  Exam reveals no gallop and no friction rub.   No murmur heard. Respiratory: Effort normal and breath sounds normal. No respiratory distress. She has no wheezes.  Musculoskeletal:  Left AKA stump site looks C/D/I. It's curving inward towards the thigh, contracted.   Right heel has pressure ulcer with opening, no drainage currenltly, no surrounding erythema or fluctuance.      Assessment & Plan:   See Encounters Tab for problem based charting.  Patient seen with Dr. Evette Doffing

## 2016-08-20 NOTE — Assessment & Plan Note (Signed)
Has residual deficit from previous stroke leading to left leg contraction.   Ordered SW consult for home health PT

## 2016-08-20 NOTE — Assessment & Plan Note (Signed)
Has chronic heel ulcer which has some wound dehiscence due to not wearing the pressure offloading boot. No signs of infection.  - dressed it today in clinic - referred to wound care center - ordered offloading boot.

## 2016-08-20 NOTE — Patient Instructions (Signed)
Continue your current medications including the amlodipine  Will refer you to wound care center

## 2016-08-20 NOTE — Assessment & Plan Note (Signed)
Having contracture of left leg. Put in for home health referral for PT.

## 2016-08-21 DIAGNOSIS — R531 Weakness: Secondary | ICD-10-CM | POA: Diagnosis not present

## 2016-08-21 LAB — CBC
HEMOGLOBIN: 12.7 g/dL (ref 11.1–15.9)
Hematocrit: 38.6 % (ref 34.0–46.6)
MCH: 30.8 pg (ref 26.6–33.0)
MCHC: 32.9 g/dL (ref 31.5–35.7)
MCV: 94 fL (ref 79–97)
Platelets: 463 10*3/uL — ABNORMAL HIGH (ref 150–379)
RBC: 4.13 x10E6/uL (ref 3.77–5.28)
RDW: 14 % (ref 12.3–15.4)
WBC: 4.9 10*3/uL (ref 3.4–10.8)

## 2016-08-21 LAB — COMPREHENSIVE METABOLIC PANEL
A/G RATIO: 1 — AB (ref 1.2–2.2)
ALBUMIN: 3.9 g/dL (ref 3.5–4.7)
ALT: 13 IU/L (ref 0–32)
AST: 18 IU/L (ref 0–40)
Alkaline Phosphatase: 92 IU/L (ref 39–117)
BUN / CREAT RATIO: 21 (ref 12–28)
BUN: 26 mg/dL (ref 8–27)
CALCIUM: 9.6 mg/dL (ref 8.7–10.3)
CHLORIDE: 107 mmol/L — AB (ref 96–106)
CO2: 25 mmol/L (ref 18–29)
Creatinine, Ser: 1.26 mg/dL — ABNORMAL HIGH (ref 0.57–1.00)
GFR, EST AFRICAN AMERICAN: 45 mL/min/{1.73_m2} — AB (ref >59)
GFR, EST NON AFRICAN AMERICAN: 39 mL/min/{1.73_m2} — AB (ref >59)
GLUCOSE: 96 mg/dL (ref 65–99)
Globulin, Total: 3.8 g/dL (ref 1.5–4.5)
Potassium: 4.2 mmol/L (ref 3.5–5.2)
Sodium: 149 mmol/L — ABNORMAL HIGH (ref 134–144)
TOTAL PROTEIN: 7.7 g/dL (ref 6.0–8.5)

## 2016-08-21 NOTE — Progress Notes (Signed)
Internal Medicine Clinic Attending  I saw and evaluated the patient.  I personally confirmed the key portions of the history and exam documented by Dr. Genene Churn and I reviewed pertinent patient test results.  The assessment, diagnosis, and plan were formulated together and I agree with the documentation in the resident's note.  Fragile elderly patient doing ok at home, being cared for by daughter. The pressure ulcer on her right heel has returned because they stopped using the off-loading boot and the patient is very sedentary throughout the day. I re-ordered an offloader to be used continuously. The wound is small, no purulence, minimal errythema around it. It probes deep, not quite to bone but close. I don't think it needs antibiotics at this time. Our wound nurse placed a dressing, and we referred her back to wound care. We managed other chronic issues including ET by checking CBC. I will forward the CBC to Dr. Darnell Level for review of the hydrea dosing. BP is high because of poor compliance with amlodipine, we encouraged more consistent use.

## 2016-08-25 ENCOUNTER — Telehealth: Payer: Self-pay | Admitting: Student in an Organized Health Care Education/Training Program

## 2016-08-25 NOTE — Telephone Encounter (Signed)
CBC drawn on 5/30 to monitor hydrea. I coordinated with Dr. Darnell Level, we will keep the same dose of hydyoxyurea (1g on Monday, and 500mg  every other day). I updated [name] by phone who tells me she understands.

## 2016-08-25 NOTE — Telephone Encounter (Signed)
-----   Message from Annia Belt, MD sent at 08/22/2016  4:44 PM EDT ----- Regarding: RE: Hydrea lab follow up Yes please ----- Message ----- From: Axel Filler, MD Sent: 08/21/2016   7:36 AM To: Annia Belt, MD Subject: Jannette Fogo lab follow up                           I saw Ms. Craig-Rudd yesterday. We got a CBC, white and Hgb good, platelets a little high at 463. Do you want to continue with the same hydrea dosing?   Thanks,  Damita Dunnings

## 2016-09-01 DIAGNOSIS — R531 Weakness: Secondary | ICD-10-CM | POA: Diagnosis not present

## 2016-09-02 ENCOUNTER — Encounter (HOSPITAL_BASED_OUTPATIENT_CLINIC_OR_DEPARTMENT_OTHER): Payer: Medicare HMO | Attending: Surgery

## 2016-09-02 DIAGNOSIS — Z86718 Personal history of other venous thrombosis and embolism: Secondary | ICD-10-CM | POA: Insufficient documentation

## 2016-09-02 DIAGNOSIS — I129 Hypertensive chronic kidney disease with stage 1 through stage 4 chronic kidney disease, or unspecified chronic kidney disease: Secondary | ICD-10-CM | POA: Insufficient documentation

## 2016-09-02 DIAGNOSIS — Z7902 Long term (current) use of antithrombotics/antiplatelets: Secondary | ICD-10-CM | POA: Insufficient documentation

## 2016-09-02 DIAGNOSIS — L89613 Pressure ulcer of right heel, stage 3: Secondary | ICD-10-CM | POA: Insufficient documentation

## 2016-09-02 DIAGNOSIS — Z96643 Presence of artificial hip joint, bilateral: Secondary | ICD-10-CM | POA: Insufficient documentation

## 2016-09-02 DIAGNOSIS — Z79899 Other long term (current) drug therapy: Secondary | ICD-10-CM | POA: Insufficient documentation

## 2016-09-02 DIAGNOSIS — D649 Anemia, unspecified: Secondary | ICD-10-CM | POA: Insufficient documentation

## 2016-09-02 DIAGNOSIS — Z951 Presence of aortocoronary bypass graft: Secondary | ICD-10-CM | POA: Insufficient documentation

## 2016-09-02 DIAGNOSIS — Z8673 Personal history of transient ischemic attack (TIA), and cerebral infarction without residual deficits: Secondary | ICD-10-CM | POA: Insufficient documentation

## 2016-09-02 DIAGNOSIS — Z89612 Acquired absence of left leg above knee: Secondary | ICD-10-CM | POA: Insufficient documentation

## 2016-09-02 DIAGNOSIS — F0390 Unspecified dementia without behavioral disturbance: Secondary | ICD-10-CM | POA: Insufficient documentation

## 2016-09-02 DIAGNOSIS — N183 Chronic kidney disease, stage 3 (moderate): Secondary | ICD-10-CM | POA: Insufficient documentation

## 2016-09-02 DIAGNOSIS — G35 Multiple sclerosis: Secondary | ICD-10-CM | POA: Insufficient documentation

## 2016-09-03 ENCOUNTER — Telehealth: Payer: Self-pay | Admitting: *Deleted

## 2016-09-03 DIAGNOSIS — Z7902 Long term (current) use of antithrombotics/antiplatelets: Secondary | ICD-10-CM | POA: Diagnosis not present

## 2016-09-03 DIAGNOSIS — E43 Unspecified severe protein-calorie malnutrition: Secondary | ICD-10-CM | POA: Diagnosis not present

## 2016-09-03 DIAGNOSIS — I129 Hypertensive chronic kidney disease with stage 1 through stage 4 chronic kidney disease, or unspecified chronic kidney disease: Secondary | ICD-10-CM | POA: Diagnosis not present

## 2016-09-03 DIAGNOSIS — Z79899 Other long term (current) drug therapy: Secondary | ICD-10-CM | POA: Diagnosis not present

## 2016-09-03 DIAGNOSIS — Z96643 Presence of artificial hip joint, bilateral: Secondary | ICD-10-CM | POA: Diagnosis not present

## 2016-09-03 DIAGNOSIS — Z951 Presence of aortocoronary bypass graft: Secondary | ICD-10-CM | POA: Diagnosis not present

## 2016-09-03 DIAGNOSIS — Z86718 Personal history of other venous thrombosis and embolism: Secondary | ICD-10-CM | POA: Diagnosis not present

## 2016-09-03 DIAGNOSIS — N183 Chronic kidney disease, stage 3 (moderate): Secondary | ICD-10-CM | POA: Diagnosis not present

## 2016-09-03 DIAGNOSIS — Z8673 Personal history of transient ischemic attack (TIA), and cerebral infarction without residual deficits: Secondary | ICD-10-CM | POA: Diagnosis not present

## 2016-09-03 DIAGNOSIS — Z89612 Acquired absence of left leg above knee: Secondary | ICD-10-CM | POA: Diagnosis not present

## 2016-09-03 DIAGNOSIS — R531 Weakness: Secondary | ICD-10-CM | POA: Diagnosis not present

## 2016-09-03 DIAGNOSIS — F0390 Unspecified dementia without behavioral disturbance: Secondary | ICD-10-CM | POA: Diagnosis not present

## 2016-09-03 DIAGNOSIS — R69 Illness, unspecified: Secondary | ICD-10-CM | POA: Diagnosis not present

## 2016-09-03 DIAGNOSIS — G35 Multiple sclerosis: Secondary | ICD-10-CM | POA: Diagnosis not present

## 2016-09-03 DIAGNOSIS — L89613 Pressure ulcer of right heel, stage 3: Secondary | ICD-10-CM | POA: Diagnosis not present

## 2016-09-03 DIAGNOSIS — D649 Anemia, unspecified: Secondary | ICD-10-CM | POA: Diagnosis not present

## 2016-09-03 DIAGNOSIS — L89610 Pressure ulcer of right heel, unstageable: Secondary | ICD-10-CM | POA: Diagnosis not present
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
## 2016-09-10 DIAGNOSIS — N183 Chronic kidney disease, stage 3 (moderate): Secondary | ICD-10-CM | POA: Diagnosis not present

## 2016-09-10 DIAGNOSIS — G35 Multiple sclerosis: Secondary | ICD-10-CM | POA: Diagnosis not present

## 2016-09-10 DIAGNOSIS — Z8673 Personal history of transient ischemic attack (TIA), and cerebral infarction without residual deficits: Secondary | ICD-10-CM | POA: Diagnosis not present

## 2016-09-10 DIAGNOSIS — Z951 Presence of aortocoronary bypass graft: Secondary | ICD-10-CM | POA: Diagnosis not present

## 2016-09-10 DIAGNOSIS — Z96643 Presence of artificial hip joint, bilateral: Secondary | ICD-10-CM | POA: Diagnosis not present

## 2016-09-10 DIAGNOSIS — Z86718 Personal history of other venous thrombosis and embolism: Secondary | ICD-10-CM | POA: Diagnosis not present

## 2016-09-10 DIAGNOSIS — R69 Illness, unspecified: Secondary | ICD-10-CM | POA: Diagnosis not present

## 2016-09-10 DIAGNOSIS — Z89612 Acquired absence of left leg above knee: Secondary | ICD-10-CM | POA: Diagnosis not present

## 2016-09-10 DIAGNOSIS — I129 Hypertensive chronic kidney disease with stage 1 through stage 4 chronic kidney disease, or unspecified chronic kidney disease: Secondary | ICD-10-CM | POA: Diagnosis not present

## 2016-09-10 DIAGNOSIS — L89613 Pressure ulcer of right heel, stage 3: Secondary | ICD-10-CM | POA: Diagnosis not present

## 2016-09-17 DIAGNOSIS — Z89612 Acquired absence of left leg above knee: Secondary | ICD-10-CM | POA: Diagnosis not present

## 2016-09-17 DIAGNOSIS — Z86718 Personal history of other venous thrombosis and embolism: Secondary | ICD-10-CM | POA: Diagnosis not present

## 2016-09-17 DIAGNOSIS — Z8673 Personal history of transient ischemic attack (TIA), and cerebral infarction without residual deficits: Secondary | ICD-10-CM | POA: Diagnosis not present

## 2016-09-17 DIAGNOSIS — G35 Multiple sclerosis: Secondary | ICD-10-CM | POA: Diagnosis not present

## 2016-09-17 DIAGNOSIS — Z951 Presence of aortocoronary bypass graft: Secondary | ICD-10-CM | POA: Diagnosis not present

## 2016-09-17 DIAGNOSIS — L89613 Pressure ulcer of right heel, stage 3: Secondary | ICD-10-CM | POA: Diagnosis not present

## 2016-09-17 DIAGNOSIS — I129 Hypertensive chronic kidney disease with stage 1 through stage 4 chronic kidney disease, or unspecified chronic kidney disease: Secondary | ICD-10-CM | POA: Diagnosis not present

## 2016-09-17 DIAGNOSIS — Z96643 Presence of artificial hip joint, bilateral: Secondary | ICD-10-CM | POA: Diagnosis not present

## 2016-09-17 DIAGNOSIS — R69 Illness, unspecified: Secondary | ICD-10-CM | POA: Diagnosis not present

## 2016-09-17 DIAGNOSIS — N183 Chronic kidney disease, stage 3 (moderate): Secondary | ICD-10-CM | POA: Diagnosis not present

## 2016-10-01 ENCOUNTER — Encounter (HOSPITAL_BASED_OUTPATIENT_CLINIC_OR_DEPARTMENT_OTHER): Payer: Medicare HMO

## 2016-10-20 ENCOUNTER — Other Ambulatory Visit: Payer: Self-pay | Admitting: Oncology

## 2016-10-20 DIAGNOSIS — D473 Essential (hemorrhagic) thrombocythemia: Secondary | ICD-10-CM

## 2016-10-20 DIAGNOSIS — D471 Chronic myeloproliferative disease: Secondary | ICD-10-CM

## 2017-03-25 ENCOUNTER — Encounter: Payer: Self-pay | Admitting: Internal Medicine

## 2017-03-25 ENCOUNTER — Ambulatory Visit (HOSPITAL_COMMUNITY)
Admission: RE | Admit: 2017-03-25 | Discharge: 2017-03-25 | Disposition: A | Discharge status: Home / Self Care | Payer: Medicare HMO | Source: Ambulatory Visit | Attending: Internal Medicine | Admitting: Internal Medicine

## 2017-03-25 ENCOUNTER — Other Ambulatory Visit: Payer: Self-pay

## 2017-03-25 ENCOUNTER — Encounter (INDEPENDENT_AMBULATORY_CARE_PROVIDER_SITE_OTHER): Payer: Self-pay

## 2017-03-25 ENCOUNTER — Ambulatory Visit (INDEPENDENT_AMBULATORY_CARE_PROVIDER_SITE_OTHER): Payer: Medicare HMO | Admitting: Internal Medicine

## 2017-03-25 VITALS — BP 170/92 | HR 76 | Temp 98.1°F

## 2017-03-25 DIAGNOSIS — R69 Illness, unspecified: Secondary | ICD-10-CM | POA: Diagnosis not present

## 2017-03-25 DIAGNOSIS — S82201A Unspecified fracture of shaft of right tibia, initial encounter for closed fracture: Secondary | ICD-10-CM

## 2017-03-25 DIAGNOSIS — Z86711 Personal history of pulmonary embolism: Secondary | ICD-10-CM | POA: Diagnosis not present

## 2017-03-25 DIAGNOSIS — M25571 Pain in right ankle and joints of right foot: Secondary | ICD-10-CM

## 2017-03-25 DIAGNOSIS — Z79899 Other long term (current) drug therapy: Secondary | ICD-10-CM

## 2017-03-25 DIAGNOSIS — E785 Hyperlipidemia, unspecified: Secondary | ICD-10-CM

## 2017-03-25 DIAGNOSIS — S82401A Unspecified fracture of shaft of right fibula, initial encounter for closed fracture: Secondary | ICD-10-CM | POA: Diagnosis not present

## 2017-03-25 DIAGNOSIS — Z8673 Personal history of transient ischemic attack (TIA), and cerebral infarction without residual deficits: Secondary | ICD-10-CM

## 2017-03-25 DIAGNOSIS — W230XXA Caught, crushed, jammed, or pinched between moving objects, initial encounter: Secondary | ICD-10-CM

## 2017-03-25 DIAGNOSIS — I10 Essential (primary) hypertension: Secondary | ICD-10-CM

## 2017-03-25 DIAGNOSIS — S82201D Unspecified fracture of shaft of right tibia, subsequent encounter for closed fracture with routine healing: Secondary | ICD-10-CM | POA: Insufficient documentation

## 2017-03-25 DIAGNOSIS — S82451A Displaced comminuted fracture of shaft of right fibula, initial encounter for closed fracture: Secondary | ICD-10-CM | POA: Diagnosis not present

## 2017-03-25 DIAGNOSIS — S82401D Unspecified fracture of shaft of right fibula, subsequent encounter for closed fracture with routine healing: Secondary | ICD-10-CM | POA: Insufficient documentation

## 2017-03-25 DIAGNOSIS — F0390 Unspecified dementia without behavioral disturbance: Secondary | ICD-10-CM

## 2017-03-25 DIAGNOSIS — Z89612 Acquired absence of left leg above knee: Secondary | ICD-10-CM | POA: Diagnosis not present

## 2017-03-25 DIAGNOSIS — D473 Essential (hemorrhagic) thrombocythemia: Secondary | ICD-10-CM | POA: Diagnosis not present

## 2017-03-25 DIAGNOSIS — M7989 Other specified soft tissue disorders: Secondary | ICD-10-CM | POA: Diagnosis not present

## 2017-03-25 DIAGNOSIS — T1490XA Injury, unspecified, initial encounter: Secondary | ICD-10-CM | POA: Diagnosis present

## 2017-03-25 DIAGNOSIS — D693 Immune thrombocytopenic purpura: Secondary | ICD-10-CM

## 2017-03-25 NOTE — Addendum Note (Signed)
Addended by: Lalla Brothers T on: 03/25/2017 03:09 PM   Modules accepted: Orders, Level of Service
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
## 2017-03-25 NOTE — Assessment & Plan Note (Signed)
Patient is on Hydrea and is supposed to receive monthly labs. - Will check CBC and CMET today.
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
## 2017-03-25 NOTE — Assessment & Plan Note (Signed)
Assessment Blood pressure regimen includes amlodipine 5mg  daily. Goal is <150 to reduce risk of hemorrhagic stroke. BP today is 170/92. She states that she ran out of refills on December 31, and has not taken amlodipine for the last 2 days. Her high blood pressure is probably related to not taking her medicines for the last 2 days. She denies headaches or blurry vision. Granddaughter states that she will be able to pick up a refill of the amlodipine today.  Plan - Continue amlodipine 5mg  daily - Follow-up on January 14

## 2017-03-25 NOTE — Patient Instructions (Addendum)
FOLLOW-UP INSTRUCTIONS When: 04/06/2017, appointment already scheduled For: BP, ankle pain, myeloproliferative disease follow-up What to bring: medications   Rachel [name],  It was a pleasure to meet you today.  For the pain in your right foot, we are going to get an x-ray of your right ankle. We will call you with the results. You can continue to elevate and use tylenol as needed for the pain. Do not take more than 3000mg  of tylenol daily.  Your blood pressure was high today, but this was likely because you had not taken your blood pressure medicine for two days. Please continue to take amlodipine 5mg  daily.  We are getting labwork today. You will follow up on 04/06/2017 where these can be discussed.
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
## 2017-03-26 ENCOUNTER — Telehealth: Payer: Self-pay

## 2017-03-26 DIAGNOSIS — S82831A Other fracture of upper and lower end of right fibula, initial encounter for closed fracture: Secondary | ICD-10-CM | POA: Diagnosis not present

## 2017-03-26 LAB — CMP14 + ANION GAP
ALT: 11 IU/L (ref 0–32)
ANION GAP: 15 mmol/L (ref 10.0–18.0)
AST: 19 IU/L (ref 0–40)
Albumin/Globulin Ratio: 1.2 (ref 1.2–2.2)
Albumin: 3.6 g/dL (ref 3.5–4.7)
Alkaline Phosphatase: 85 IU/L (ref 39–117)
BUN/Creatinine Ratio: 29 — ABNORMAL HIGH (ref 12–28)
BUN: 33 mg/dL — AB (ref 8–27)
Bilirubin Total: 0.2 mg/dL (ref 0.0–1.2)
CALCIUM: 9.4 mg/dL (ref 8.7–10.3)
CO2: 24 mmol/L (ref 20–29)
CREATININE: 1.13 mg/dL — AB (ref 0.57–1.00)
Chloride: 102 mmol/L (ref 96–106)
GFR calc Af Amer: 50 mL/min/{1.73_m2} — ABNORMAL LOW (ref >59)
GFR, EST NON AFRICAN AMERICAN: 44 mL/min/{1.73_m2} — AB (ref >59)
GLOBULIN, TOTAL: 3.1 g/dL (ref 1.5–4.5)
Glucose: 109 mg/dL — ABNORMAL HIGH (ref 65–99)
Potassium: 4.4 mmol/L (ref 3.5–5.2)
SODIUM: 141 mmol/L (ref 134–144)
Total Protein: 6.7 g/dL (ref 6.0–8.5)

## 2017-03-26 LAB — CBC
HEMATOCRIT: 40.4 % (ref 34.0–46.6)
HEMOGLOBIN: 12.7 g/dL (ref 11.1–15.9)
MCH: 24.4 pg — AB (ref 26.6–33.0)
MCHC: 31.4 g/dL — ABNORMAL LOW (ref 31.5–35.7)
MCV: 78 fL — AB (ref 79–97)
Platelets: 625 10*3/uL — ABNORMAL HIGH (ref 150–379)
RBC: 5.2 x10E6/uL (ref 3.77–5.28)
RDW: 17.1 % — ABNORMAL HIGH (ref 12.3–15.4)
WBC: 10.1 10*3/uL (ref 3.4–10.8)

## 2017-03-26 NOTE — Telephone Encounter (Signed)
Requesting lab results. Please call back.  

## 2017-03-27 ENCOUNTER — Telehealth: Payer: Self-pay | Admitting: Student in an Organized Health Care Education/Training Program

## 2017-03-27 NOTE — Telephone Encounter (Signed)
error 

## 2017-03-27 NOTE — Telephone Encounter (Signed)
I spoke with Rachel [name] this morning. Dr. Darnell Level wants to keep the same Hydrea dosing:  1 g on Monday, and 500 mg every other day. We will recheck another CBC in 3-4 weeks.   She did go to [facility] yesterday, no surgery planned for the tib-fib fracture. They put her in a rigid boot, and gave tramadol for as needed break through pain. It sounds like her pain is minimal unless the foot is being moved, so I doubt she will need much of it.   I will see her again in the office on 1/14 to go over other medical issues.

## 2017-03-27 NOTE — Telephone Encounter (Signed)
-----   Message from Annia Belt, MD sent at 03/27/2017  3:37 PM EST ----- OK - thanks - when she was on 1 gram 3x/wk it was too much so I will sit tight and get one more point on the curve and if she is still over 600,000 I will have her increase to 2 days a week when she takes 2 grams & 500 mg on the other days.  JG ----- Message ----- From: Axel Filler, MD Sent: 03/27/2017   1:16 PM To: Annia Belt, MD  Yes, I think I mis-reported it to you. My apologies. She is currently taking 1g on Monday, and 500mg  all other days. I confirmed that with [name] this morning.    ----- Message ----- From: Annia Belt, MD Sent: 03/27/2017  12:20 PM To: Axel Filler, MD, Colbert Ewing, MD  I believe there was a miscommunication on Hydrea dose when it was changed on 08/20/16. She should be on 2, 500 mg capsules on Mondays = 1000 mg and 500 mg all other days of the week ( not 500 mg QOD). Let's make this change now then check CBC in 4-6 weeks.  Algis Greenhouse

## 2017-04-06 ENCOUNTER — Encounter: Payer: Self-pay | Admitting: Student in an Organized Health Care Education/Training Program

## 2017-04-06 ENCOUNTER — Encounter: Payer: Medicare HMO | Admitting: Student in an Organized Health Care Education/Training Program

## 2017-06-03 ENCOUNTER — Ambulatory Visit (INDEPENDENT_AMBULATORY_CARE_PROVIDER_SITE_OTHER): Payer: Medicare HMO | Admitting: Internal Medicine

## 2017-06-03 VITALS — BP 177/88 | HR 64 | Temp 97.7°F

## 2017-06-03 DIAGNOSIS — L97512 Non-pressure chronic ulcer of other part of right foot with fat layer exposed: Secondary | ICD-10-CM | POA: Diagnosis not present

## 2017-06-03 DIAGNOSIS — Z89612 Acquired absence of left leg above knee: Secondary | ICD-10-CM

## 2017-06-03 DIAGNOSIS — Z87311 Personal history of (healed) other pathological fracture: Secondary | ICD-10-CM | POA: Diagnosis not present

## 2017-06-03 DIAGNOSIS — D473 Essential (hemorrhagic) thrombocythemia: Secondary | ICD-10-CM

## 2017-06-03 DIAGNOSIS — D693 Immune thrombocytopenic purpura: Secondary | ICD-10-CM | POA: Diagnosis not present

## 2017-06-03 DIAGNOSIS — I739 Peripheral vascular disease, unspecified: Secondary | ICD-10-CM

## 2017-06-03 DIAGNOSIS — S82201A Unspecified fracture of shaft of right tibia, initial encounter for closed fracture: Secondary | ICD-10-CM | POA: Diagnosis not present

## 2017-06-03 DIAGNOSIS — L97513 Non-pressure chronic ulcer of other part of right foot with necrosis of muscle: Secondary | ICD-10-CM | POA: Diagnosis not present

## 2017-06-03 NOTE — Assessment & Plan Note (Signed)
Patient is on Hydrea for her essential thrombocythemia.  Plan: - Check CBC today.

## 2017-06-03 NOTE — Progress Notes (Signed)
Internal Medicine Clinic Attending  I saw and evaluated the patient.  I personally confirmed the key portions of the history and exam documented by Dr. Juleen China and I reviewed pertinent patient test results.  The assessment, diagnosis, and plan were formulated together and I agree with the documentation in the resident's note.  New pressure wound due to the ortho boot she was using for tib-fib fracture. We talked about off loading techniques. Also addressed today was thrombocytosis, plan for CBC check, continue Hydrea at current dosing and I will follow up plt count and adjust dose as needed.

## 2017-06-03 NOTE — Progress Notes (Signed)
   CC: Right foot wound  HPI:  Rachel [name] is a 84 y.o. woman with a past medical history listed below here today for a new right foot wound.  For details of today's visit and the status of her chronic medical issues please refer to the assessment and plan.   Past Medical History:  Diagnosis Date  . Dementia   . Hyperlipidemia   . Hypertension   . Hypothyroidism   . Myeloproliferative disorder ([place]) 10/12/2015   essential thrombocythemia likely present since 02/2015  . Pressure ulcer of right heel, stage 4 ([place]) 04/15/2015  . Severe protein-calorie malnutrition ([place])   . Stroke [facility])    Left sided weakness  . Thrombocytosis ([place]) 04/13/2015   Review of Systems:   Review of systems unable to be obtained due to patients hx of dementia.  Physical Exam:  Vitals:   06/03/17 1348  BP: (!) 177/88  Pulse: 64  Temp: 97.7 F (36.5 C)  TempSrc: Oral  SpO2: 100%   Physical Exam  Constitutional:  Chronically ill appearing, elderly woman, sleeping in wheelchair.   Pulmonary/Chest: Effort normal.  Musculoskeletal: She exhibits deformity.  Left AKA   Skin: Skin is warm and dry.  She has a pressure ulcer at her right heel that appears well healed with black eschar.   She has a new pressure ulcer about 1.5 x 1.5cm at her plantar aspect of 5th metatarsal head.  There is some granulation tissue, no significant purulence and no odor.      Assessment & Plan:   See Encounters Tab for problem based charting.  Patient seen with Dr. Evette Doffing.  Skin ulcer of right foot with fat layer exposed ([place]) She has a new ulcer at her 5th metatarsal head on the plantar aspect.  This is relatively fresh as patients grand daughter reports last seeing her foot about 5 days ago. She was seen by her orthopedist today for f/u of tibia-fibula fracture who did not think the wound looked infected.  She is scheduled to see wound care on March 27 as this was the closest available appointment.  There has  been no fevers or change in her baseline mental status.  There was some drainage of brown fluid this morning when they first noticed the wound but otherwise no real foul odors.  She has significant PVD and is at risk for infection but currently this does not appear to be the case.  I suspect the source of her pressure wound was from the boot that was in place for her fracture.  Plan: - Wound care provided here in clinic today with our nurse using silicone gel adhesive hydrocellular foam dressing.  Extra supplies given to patient and family - Will hold off on giving antibiotics for now - Return precautions given to patient and family - RTC 1 week - CBC  Essential thrombocythemia ([place]) Patient is on Hydrea for her essential thrombocythemia.  Plan: - Check CBC today.

## 2017-06-03 NOTE — Patient Instructions (Signed)
FOLLOW-UP INSTRUCTIONS When: in 1 week For: check up on your right foot wound   Please come back in 1 week to see how your foot wound is doing.  Provide wound care as instructed by our clinic staff.  Please call us if you notice change in her baseline status, fevers, chills, or other signs of developing infection.

## 2017-06-03 NOTE — Assessment & Plan Note (Signed)
She has a new ulcer at her 5th metatarsal head on the plantar aspect.  This is relatively fresh as patients grand daughter reports last seeing her foot about 5 days ago. She was seen by her orthopedist today for f/u of tibia-fibula fracture who did not think the wound looked infected.  She is scheduled to see wound care on March 27 as this was the closest available appointment.  There has been no fevers or change in her baseline mental status.  There was some drainage of brown fluid this morning when they first noticed the wound but otherwise no real foul odors.  She has significant PVD and is at risk for infection but currently this does not appear to be the case.  I suspect the source of her pressure wound was from the boot that was in place for her fracture.  Plan: - Wound care provided here in clinic today with our nurse using silicone gel adhesive hydrocellular foam dressing.  Extra supplies given to patient and family - Will hold off on giving antibiotics for now - Return precautions given to patient and family - RTC 1 week - CBC

## 2017-06-04 LAB — CBC
HEMATOCRIT: 38.4 % (ref 34.0–46.6)
HEMOGLOBIN: 11.8 g/dL (ref 11.1–15.9)
MCH: 20.9 pg — AB (ref 26.6–33.0)
MCHC: 30.7 g/dL — ABNORMAL LOW (ref 31.5–35.7)
MCV: 68 fL — AB (ref 79–97)
Platelets: 737 10*3/uL — ABNORMAL HIGH (ref 150–379)
RBC: 5.65 x10E6/uL — AB (ref 3.77–5.28)
RDW: 19.9 % — ABNORMAL HIGH (ref 12.3–15.4)
WBC: 7.2 10*3/uL (ref 3.4–10.8)

## 2017-06-08 ENCOUNTER — Telehealth: Payer: Self-pay | Admitting: Student in an Organized Health Care Education/Training Program

## 2017-06-08 MED ORDER — POLYSACCHARIDE IRON COMPLEX 150 MG PO CAPS: 150.0000 mg | ORAL_CAPSULE | Freq: Every day | ORAL | 3 refills | Status: DC

## 2017-06-08 NOTE — Telephone Encounter (Signed)
I am trying to follow up with patient's granddaughter [name]) about CBC results. We think she has a new iron deficiency which is driving up platelet levels. I have not been able to reach the patient, no answer to calls and VM not set up.   Patient should start Nu-iron 150mg  daily, I have sent to her pharmacy. Then we should recheck CBC in 1 month. Can someone help me with communicating this to the family? Thanks.

## 2017-06-10 ENCOUNTER — Ambulatory Visit (INDEPENDENT_AMBULATORY_CARE_PROVIDER_SITE_OTHER): Payer: Medicare HMO | Admitting: Internal Medicine

## 2017-06-10 ENCOUNTER — Other Ambulatory Visit: Payer: Self-pay

## 2017-06-10 ENCOUNTER — Encounter: Payer: Self-pay | Admitting: Internal Medicine

## 2017-06-10 DIAGNOSIS — L97512 Non-pressure chronic ulcer of other part of right foot with fat layer exposed: Secondary | ICD-10-CM

## 2017-06-10 DIAGNOSIS — D509 Iron deficiency anemia, unspecified: Secondary | ICD-10-CM | POA: Diagnosis not present

## 2017-06-10 DIAGNOSIS — Z89612 Acquired absence of left leg above knee: Secondary | ICD-10-CM

## 2017-06-10 DIAGNOSIS — D500 Iron deficiency anemia secondary to blood loss (chronic): Secondary | ICD-10-CM

## 2017-06-10 NOTE — Progress Notes (Addendum)
   CC: follow up pressure ulcer  HPI:  [name] is a 84 y.o. woman with a past medical history listed below here today for follow up of her pressure ulcer.  For details of today's visit and the status of her chronic medical issues please refer to the assessment and plan.   Past Medical History:  Diagnosis Date  . Dementia   . Hyperlipidemia   . Hypertension   . Hypothyroidism   . Myeloproliferative disorder ([place]) 10/12/2015   essential thrombocythemia likely present since 02/2015  . Pressure ulcer of right heel, stage 4 ([place]) 04/15/2015  . Severe protein-calorie malnutrition ([place])   . Stroke [facility])    Left sided weakness  . Thrombocytosis ([place]) 04/13/2015   Review of Systems:  Please see pertinent ROS reviewed in HPI and problem based charting.   Physical Exam:  Vitals:   06/10/17 1608  BP: (!) 150/67  Pulse: (!) 55  Temp: 97.8 F (36.6 C)  TempSrc: Oral   Physical Exam  Constitutional:  Elderly, AA woman, sitting in wheelchair.   HENT:  Head: Normocephalic and atraumatic.  Eyes: Conjunctivae and EOM are normal.  Pulmonary/Chest: Effort normal.  Musculoskeletal: She exhibits no edema.  Left AKA  Neurological: She is alert.  Skin: Skin is warm and dry.  Her right plantar wound appears to be doing well.  There is some granulation tissue on the lateral aspect of the wound that looks like it is trying to heal.  The central aspect of the wound has eschar.  There is no purulent drainage or foul odor.      Assessment & Plan:   See Encounters Tab for problem based charting.  Patient seen with Dr. Dareen Piano.   Iron deficiency anemia Her PCP and hematologist believe that she has a new iron deficiency that is contributing to her increasing platelet count.  Iron supplementation has been prescribed but there has been some difficulty in contacting family.  Plan: - I have communicated to patients granddaughter new prescription for Nu-iron 150mg  daily - Recheck CBC in  1 month    Skin ulcer of right foot with fat layer exposed ([place]) Her new pressure ulcer appears to be stable and actually improving from 1 week ago.  There is granulation tissue on the lateral aspect of the wound.  The central aspect needs debridement which they should be able to do at their Wound Care appointment next week.  There has not been any increased purulent drainage, foul odor, systemic symptoms of infection.  Her granddaughter has been providing wound care as prescribed at visit last week.   Plan: - Continue using silicone gel adhesive hydrocellular foam dressing until wound care visit next week.  We provided dressing change here today with this. - Return precautions given again today

## 2017-06-10 NOTE — Patient Instructions (Signed)
FOLLOW-UP INSTRUCTIONS When: March 27 at [facility].  Come back in 1 month to our clinic for repeat labs.  For: at wound care appointment   Please continue to provide the wound care that you have been doing.  Follow up with wound care as scheduled on March 27.  Start the iron supplementation that Dr Evette Doffing prescribed and come back to our clinic in 1 month for repeat labs.

## 2017-06-10 NOTE — Assessment & Plan Note (Addendum)
Her new pressure ulcer appears to be stable and actually improving from 1 week ago.  There is granulation tissue on the lateral aspect of the wound.  The central aspect needs debridement which they should be able to do at their Wound Care appointment next week.  There has not been any increased purulent drainage, foul odor, systemic symptoms of infection.  Her granddaughter has been providing wound care as prescribed at visit last week.   Plan: - Continue using silicone gel adhesive hydrocellular foam dressing until wound care visit next week.  We provided dressing change here today with this. - Return precautions given again today

## 2017-06-10 NOTE — Assessment & Plan Note (Signed)
Her PCP and hematologist believe that she has a new iron deficiency that is contributing to her increasing platelet count.  Iron supplementation has been prescribed but there has been some difficulty in contacting family.  Plan: - I have communicated to patients granddaughter new prescription for Nu-iron 150mg  daily - Recheck CBC in 1 month

## 2017-06-11 NOTE — Progress Notes (Signed)
Internal Medicine Clinic Attending  I saw and evaluated the patient.  I personally confirmed the key portions of the history and exam documented by Dr. Wallace and I reviewed pertinent patient test results.  The assessment, diagnosis, and plan were formulated together and I agree with the documentation in the resident's note. 

## 2017-06-17 ENCOUNTER — Encounter (HOSPITAL_BASED_OUTPATIENT_CLINIC_OR_DEPARTMENT_OTHER): Payer: Medicare HMO | Attending: Internal Medicine

## 2017-06-17 ENCOUNTER — Other Ambulatory Visit: Payer: Self-pay | Admitting: Physician Assistant

## 2017-06-17 ENCOUNTER — Other Ambulatory Visit (HOSPITAL_COMMUNITY): Payer: Self-pay | Admitting: *Deleted

## 2017-06-17 ENCOUNTER — Ambulatory Visit (HOSPITAL_COMMUNITY)
Admission: RE | Admit: 2017-06-17 | Discharge: 2017-06-17 | Disposition: A | Discharge status: Home / Self Care | Payer: Medicare HMO | Source: Ambulatory Visit | Attending: Physician Assistant | Admitting: Physician Assistant

## 2017-06-17 DIAGNOSIS — L89894 Pressure ulcer of other site, stage 4: Secondary | ICD-10-CM | POA: Diagnosis not present

## 2017-06-17 DIAGNOSIS — F0390 Unspecified dementia without behavioral disturbance: Secondary | ICD-10-CM | POA: Diagnosis not present

## 2017-06-17 DIAGNOSIS — I129 Hypertensive chronic kidney disease with stage 1 through stage 4 chronic kidney disease, or unspecified chronic kidney disease: Secondary | ICD-10-CM | POA: Diagnosis not present

## 2017-06-17 DIAGNOSIS — E440 Moderate protein-calorie malnutrition: Secondary | ICD-10-CM | POA: Insufficient documentation

## 2017-06-17 DIAGNOSIS — I739 Peripheral vascular disease, unspecified: Secondary | ICD-10-CM | POA: Insufficient documentation

## 2017-06-17 DIAGNOSIS — N183 Chronic kidney disease, stage 3 (moderate): Secondary | ICD-10-CM | POA: Insufficient documentation

## 2017-06-17 DIAGNOSIS — L89613 Pressure ulcer of right heel, stage 3: Secondary | ICD-10-CM | POA: Diagnosis not present

## 2017-06-17 DIAGNOSIS — X58XXXA Exposure to other specified factors, initial encounter: Secondary | ICD-10-CM | POA: Insufficient documentation

## 2017-06-17 DIAGNOSIS — G35 Multiple sclerosis: Secondary | ICD-10-CM | POA: Diagnosis not present

## 2017-06-17 DIAGNOSIS — M86171 Other acute osteomyelitis, right ankle and foot: Secondary | ICD-10-CM | POA: Diagnosis not present

## 2017-06-17 DIAGNOSIS — S82391A Other fracture of lower end of right tibia, initial encounter for closed fracture: Secondary | ICD-10-CM | POA: Diagnosis not present

## 2017-06-17 DIAGNOSIS — L89899 Pressure ulcer of other site, unspecified stage: Secondary | ICD-10-CM | POA: Diagnosis not present

## 2017-06-17 DIAGNOSIS — R69 Illness, unspecified: Secondary | ICD-10-CM | POA: Diagnosis not present

## 2017-06-18 ENCOUNTER — Other Ambulatory Visit (HOSPITAL_COMMUNITY)
Admission: RE | Admit: 2017-06-18 | Discharge: 2017-06-18 | Disposition: A | Discharge status: Home / Self Care | Payer: Medicare HMO | Source: Other Acute Inpatient Hospital | Attending: Physician Assistant | Admitting: Physician Assistant

## 2017-06-18 DIAGNOSIS — L89890 Pressure ulcer of other site, unstageable: Secondary | ICD-10-CM | POA: Insufficient documentation

## 2017-06-20 DIAGNOSIS — I69354 Hemiplegia and hemiparesis following cerebral infarction affecting left non-dominant side: Secondary | ICD-10-CM | POA: Diagnosis not present

## 2017-06-20 DIAGNOSIS — D631 Anemia in chronic kidney disease: Secondary | ICD-10-CM | POA: Diagnosis not present

## 2017-06-20 DIAGNOSIS — L89894 Pressure ulcer of other site, stage 4: Secondary | ICD-10-CM | POA: Diagnosis not present

## 2017-06-20 DIAGNOSIS — Z89612 Acquired absence of left leg above knee: Secondary | ICD-10-CM | POA: Diagnosis not present

## 2017-06-20 DIAGNOSIS — E440 Moderate protein-calorie malnutrition: Secondary | ICD-10-CM | POA: Diagnosis not present

## 2017-06-20 DIAGNOSIS — I120 Hypertensive chronic kidney disease with stage 5 chronic kidney disease or end stage renal disease: Secondary | ICD-10-CM | POA: Diagnosis not present

## 2017-06-20 DIAGNOSIS — Z7902 Long term (current) use of antithrombotics/antiplatelets: Secondary | ICD-10-CM | POA: Diagnosis not present

## 2017-06-20 DIAGNOSIS — I739 Peripheral vascular disease, unspecified: Secondary | ICD-10-CM | POA: Diagnosis not present

## 2017-06-20 DIAGNOSIS — D471 Chronic myeloproliferative disease: Secondary | ICD-10-CM | POA: Diagnosis not present

## 2017-06-20 DIAGNOSIS — N183 Chronic kidney disease, stage 3 (moderate): Secondary | ICD-10-CM | POA: Diagnosis not present

## 2017-06-21 LAB — AEROBIC CULTURE  (SUPERFICIAL SPECIMEN)

## 2017-06-21 LAB — AEROBIC CULTURE W GRAM STAIN (SUPERFICIAL SPECIMEN): Gram Stain: NONE SEEN

## 2017-06-24 ENCOUNTER — Encounter (HOSPITAL_BASED_OUTPATIENT_CLINIC_OR_DEPARTMENT_OTHER): Payer: Medicare HMO | Attending: Physician Assistant

## 2017-06-24 DIAGNOSIS — I739 Peripheral vascular disease, unspecified: Secondary | ICD-10-CM | POA: Diagnosis not present

## 2017-06-24 DIAGNOSIS — B952 Enterococcus as the cause of diseases classified elsewhere: Secondary | ICD-10-CM | POA: Insufficient documentation

## 2017-06-24 DIAGNOSIS — M869 Osteomyelitis, unspecified: Secondary | ICD-10-CM | POA: Diagnosis not present

## 2017-06-24 DIAGNOSIS — I129 Hypertensive chronic kidney disease with stage 1 through stage 4 chronic kidney disease, or unspecified chronic kidney disease: Secondary | ICD-10-CM | POA: Insufficient documentation

## 2017-06-24 DIAGNOSIS — L89890 Pressure ulcer of other site, unstageable: Secondary | ICD-10-CM | POA: Diagnosis not present

## 2017-06-24 DIAGNOSIS — E440 Moderate protein-calorie malnutrition: Secondary | ICD-10-CM | POA: Insufficient documentation

## 2017-06-24 DIAGNOSIS — R69 Illness, unspecified: Secondary | ICD-10-CM | POA: Diagnosis not present

## 2017-06-24 DIAGNOSIS — D509 Iron deficiency anemia, unspecified: Secondary | ICD-10-CM | POA: Diagnosis not present

## 2017-06-24 DIAGNOSIS — L89894 Pressure ulcer of other site, stage 4: Secondary | ICD-10-CM | POA: Insufficient documentation

## 2017-06-24 DIAGNOSIS — F0390 Unspecified dementia without behavioral disturbance: Secondary | ICD-10-CM | POA: Insufficient documentation

## 2017-06-24 DIAGNOSIS — N183 Chronic kidney disease, stage 3 (moderate): Secondary | ICD-10-CM | POA: Insufficient documentation

## 2017-07-01 DIAGNOSIS — M869 Osteomyelitis, unspecified: Secondary | ICD-10-CM | POA: Diagnosis not present

## 2017-07-01 DIAGNOSIS — R69 Illness, unspecified: Secondary | ICD-10-CM | POA: Diagnosis not present

## 2017-07-01 DIAGNOSIS — N183 Chronic kidney disease, stage 3 (moderate): Secondary | ICD-10-CM | POA: Diagnosis not present

## 2017-07-01 DIAGNOSIS — L89894 Pressure ulcer of other site, stage 4: Secondary | ICD-10-CM | POA: Diagnosis not present

## 2017-07-01 DIAGNOSIS — E440 Moderate protein-calorie malnutrition: Secondary | ICD-10-CM | POA: Diagnosis not present

## 2017-07-01 DIAGNOSIS — D509 Iron deficiency anemia, unspecified: Secondary | ICD-10-CM | POA: Diagnosis not present

## 2017-07-01 DIAGNOSIS — I739 Peripheral vascular disease, unspecified: Secondary | ICD-10-CM | POA: Diagnosis not present

## 2017-07-01 DIAGNOSIS — B952 Enterococcus as the cause of diseases classified elsewhere: Secondary | ICD-10-CM | POA: Diagnosis not present

## 2017-07-01 DIAGNOSIS — I129 Hypertensive chronic kidney disease with stage 1 through stage 4 chronic kidney disease, or unspecified chronic kidney disease: Secondary | ICD-10-CM | POA: Diagnosis not present

## 2017-07-01 NOTE — Progress Notes (Deleted)
Subjective:    Patient ID: Rachel [name], female    DOB: 10/30/1927, 84 y.o.   MRN: [id number]  No chief complaint on file.   HPI:  Rachel [name] is a 84 y.o. female with a previous medical history of dementia, hypertension, hypothyroidism, PVD, and CKD III who presents today for initial evaluation and treatment of osteomyelitis.   Rachel [name] is currently being followed by the [facility] for an ulcer located on the right fifth metatarsal head which has been going on for about 1 month now. She had a recent ankle fracture in January for which she was treated with a rigid boot for immobilization which is believed to have contributed to breakdown of the skin of the right 5 metarsal. Modifying factors include a silicone gel adhesive hydrocellular foam dressing and Santyl. Unfortunately she has poor circulation with her last ABI being 0.38 in June of 2018. Foot x-rays obtained on 06/17/17 showed osteomyelitis involving the lateral aspect of the head of the right fifth metatarsal. Cultures from the wound show enterococcus faecalis which is sensitive to ampicillin and vancomycin and levofloxacin.     Allergies  Allergen Reactions  . Aspirin Other (See Comments)    Reaction unknown, per granddaughter      Outpatient Medications Prior to Visit  Medication Sig Dispense Refill  . amLODipine (NORVASC) 5 MG tablet Take 1 tablet (5 mg total) by mouth daily. 90 tablet 3  . clopidogrel (PLAVIX) 75 MG tablet Take 1 tablet (75 mg total) by mouth daily. 90 tablet 3  . collagenase (SANTYL) ointment Apply topically daily. 15 g 0  . feeding supplement, ENSURE ENLIVE, (ENSURE ENLIVE) LIQD Take 237 mLs by mouth 2 (two) times daily between meals. 237 mL 12  . ferrous sulfate 325 (65 FE) MG tablet Take 1 tablet (325 mg total) by mouth daily with breakfast. 30 tablet 1  . hydroxyurea (HYDREA) 500 MG capsule Take two caps (1000mg ) on Monday, and one cap (500mg ) every other day. 60 capsule  3  . iron polysaccharides (NU-IRON) 150 MG capsule Take 1 capsule (150 mg total) by mouth daily. 30 capsule 3  . levothyroxine (SYNTHROID, LEVOTHROID) 50 MCG tablet Take 1 tablet (50 mcg total) by mouth daily before breakfast. 90 tablet 3  . Multiple Vitamin (MULTIVITAMIN WITH MINERALS) TABS tablet Take 1 tablet by mouth daily.    . pravastatin (PRAVACHOL) 20 MG tablet Take 1 tablet (20 mg total) by mouth daily at 6 PM. 90 tablet 3  . senna-docusate (SENOKOT-S) 8.6-50 MG tablet Take 1 tablet by mouth at bedtime as needed for mild constipation. 30 tablet 0  . Vitamins A & D (VITAMIN A & D) ointment Apply 1 application topically as needed (with every adult brief change.).     No facility-administered medications prior to visit.      Past Medical History:  Diagnosis Date  . Dementia   . Hyperlipidemia   . Hypertension   . Hypothyroidism   . Myeloproliferative disorder ([place]) 10/12/2015   essential thrombocythemia likely present since 02/2015  . Pressure ulcer of right heel, stage 4 ([place]) 04/15/2015  . Severe protein-calorie malnutrition ([place])   . Stroke [facility])    Left sided weakness  . Thrombocytosis ([place]) 04/13/2015      Past Surgical History:  Procedure Laterality Date  . AMPUTATION Left 04/25/2015   Procedure: AMPUTATION ABOVE KNEE;  Surgeon: Rosetta Posner, MD;  Location: [facility];  Service: Vascular;  Laterality: Left;  . CORONARY ARTERY  BYPASS GRAFT    . TOTAL HIP ARTHROPLASTY Right   . TOTAL HIP ARTHROPLASTY Left       Family History  Problem Relation Age of Onset  . Obesity Other   . Heart attack Brother       Social History   Socioeconomic History  . Marital status: Married    Spouse name: Not on file  . Number of children: 3  . Years of education: Not on file  . Highest education level: Not on file  Occupational History  . Not on file  Social Needs  . Financial resource strain: Not on file  . Food insecurity:    Worry: Not on file    Inability: Not on file  .  Transportation needs:    Medical: Not on file    Non-medical: Not on file  Tobacco Use  . Smoking status: Never Smoker  . Smokeless tobacco: Never Used  Substance and Sexual Activity  . Alcohol use: No    Alcohol/week: 0.0 oz  . Drug use: No  . Sexual activity: Not on file  Lifestyle  . Physical activity:    Days per week: Not on file    Minutes per session: Not on file  . Stress: Not on file  Relationships  . Social connections:    Talks on phone: Not on file    Gets together: Not on file    Attends religious service: Not on file    Active member of club or organization: Not on file    Attends meetings of clubs or organizations: Not on file    Relationship status: Not on file  . Intimate partner violence:    Fear of current or ex partner: Not on file    Emotionally abused: Not on file    Physically abused: Not on file    Forced sexual activity: Not on file  Other Topics Concern  . Not on file  Social History Narrative   Married.  Lives with granddaughter & husband.  Wheelchair bound.      Review of Systems     Objective:    There were no vitals taken for this visit. Nursing note and vital signs reviewed.  Physical Exam      Assessment & Plan:   Problem List Items Addressed This Visit    None       I am having [name] maintain her multivitamin with minerals, vitamin A & D, senna-docusate, feeding supplement (ENSURE ENLIVE), ferrous sulfate, collagenase, pravastatin, amLODipine, levothyroxine, clopidogrel, hydroxyurea, and iron polysaccharides.   No orders of the defined types were placed in this encounter.    Follow-up: No follow-ups on file.  Mauricio Po, [place] for Infectious Disease

## 2017-07-07 ENCOUNTER — Ambulatory Visit: Payer: Medicare HMO | Admitting: Family

## 2017-08-04 NOTE — Progress Notes (Deleted)
Subjective:    Patient ID: Rachel Rachel [name], female    DOB: 12-22-1927, 84 y.o.   MRN: [id number]  Rachel [name] chief complaint on file.   HPI:  Rachel Rachel [name] is a 84 y.o. female with a previous medical history of dementia, hypertension, hypothyroidism, PVD, and CKD III who presents today for initial evaluation and treatment of osteomyelitis.   Rachel Rachel [name] is currently being followed by the [facility] for an ulcer located on the right fifth metatarsal head which has been going on for about 1 month now. She had a recent ankle fracture in January for which she was treated with a rigid boot for immobilization which is believed to have contributed to breakdown of the skin of the right 5 metarsal. Modifying factors include a silicone gel adhesive hydrocellular foam dressing and Santyl. Unfortunately she has poor circulation with her last ABI being 0.38 in June of 2018. Foot x-rays obtained on 06/17/17 showed osteomyelitis involving the lateral aspect of the head of the right fifth metatarsal. Cultures from the wound show enterococcus faecalis which is sensitive to ampicillin and vancomycin and levofloxacin.     Allergies  Allergen Reactions  . Aspirin Other (See Comments)    Reaction unknown, per granddaughter      Outpatient Medications Prior to Visit  Medication Sig Dispense Refill  . amLODipine (NORVASC) 5 MG tablet Take 1 tablet (5 mg total) by mouth daily. 90 tablet 3  . clopidogrel (PLAVIX) 75 MG tablet Take 1 tablet (75 mg total) by mouth daily. 90 tablet 3  . collagenase (SANTYL) ointment Apply topically daily. 15 g 0  . feeding supplement, ENSURE ENLIVE, (ENSURE ENLIVE) LIQD Take 237 mLs by mouth 2 (two) times daily between meals. 237 mL 12  . ferrous sulfate 325 (65 FE) MG tablet Take 1 tablet (325 mg total) by mouth daily with breakfast. 30 tablet 1  . hydroxyurea (HYDREA) 500 MG capsule Take two caps (1000mg ) on Monday, and one cap (500mg ) every other day. 60  capsule 3  . iron polysaccharides (NU-IRON) 150 MG capsule Take 1 capsule (150 mg total) by mouth daily. 30 capsule 3  . levothyroxine (SYNTHROID, LEVOTHROID) 50 MCG tablet Take 1 tablet (50 mcg total) by mouth daily before breakfast. 90 tablet 3  . Multiple Vitamin (MULTIVITAMIN WITH MINERALS) TABS tablet Take 1 tablet by mouth daily.    . pravastatin (PRAVACHOL) 20 MG tablet Take 1 tablet (20 mg total) by mouth daily at 6 PM. 90 tablet 3  . senna-docusate (SENOKOT-S) 8.6-50 MG tablet Take 1 tablet by mouth at bedtime as needed for mild constipation. 30 tablet 0  . Vitamins A & D (VITAMIN A & D) ointment Apply 1 application topically as needed (with every adult brief change.).     Rachel [name] facility-administered medications prior to visit.      Past Medical History:  Diagnosis Date  . Dementia   . Hyperlipidemia   . Hypertension   . Hypothyroidism   . Myeloproliferative disorder ([place]) 10/12/2015   essential thrombocythemia likely present since 02/2015  . Pressure ulcer of right heel, stage 4 ([place]) 04/15/2015  . Severe protein-calorie malnutrition ([place])   . Stroke [facility])    Left sided weakness  . Thrombocytosis ([place]) 04/13/2015      Past Surgical History:  Procedure Laterality Date  . AMPUTATION Left 04/25/2015   Procedure: AMPUTATION ABOVE KNEE;  Surgeon: Rosetta Posner, MD;  Location: [facility];  Service: Vascular;  Laterality: Left;  . CORONARY ARTERY  BYPASS GRAFT    . TOTAL HIP ARTHROPLASTY Right   . TOTAL HIP ARTHROPLASTY Left       Family History  Problem Relation Age of Onset  . Obesity Other   . Heart attack Brother       Social History   Socioeconomic History  . Marital status: Married    Spouse name: Not on file  . Number of children: 3  . Years of education: Not on file  . Highest education level: Not on file  Occupational History  . Not on file  Social Needs  . Financial resource strain: Not on file  . Food insecurity:    Worry: Not on file    Inability: Not on  file  . Transportation needs:    Medical: Not on file    Non-medical: Not on file  Tobacco Use  . Smoking status: Never Smoker  . Smokeless tobacco: Never Used  Substance and Sexual Activity  . Alcohol use: Rachel [name]    Alcohol/week: 0.0 oz  . Drug use: Rachel [name]  . Sexual activity: Not on file  Lifestyle  . Physical activity:    Days per week: Not on file    Minutes per session: Not on file  . Stress: Not on file  Relationships  . Social connections:    Talks on phone: Not on file    Gets together: Not on file    Attends religious service: Not on file    Active member of club or organization: Not on file    Attends meetings of clubs or organizations: Not on file    Relationship status: Not on file  . Intimate partner violence:    Fear of current or ex partner: Not on file    Emotionally abused: Not on file    Physically abused: Not on file    Forced sexual activity: Not on file  Other Topics Concern  . Not on file  Social History Narrative   Married.  Lives with granddaughter & husband.  Wheelchair bound.      Review of Systems     Objective:    There were Rachel [name] vitals taken for this visit. Nursing note and vital signs reviewed.  Physical Exam      Assessment & Plan:   Problem List Items Addressed This Visit    None       I am having Rachel Rachel [name] maintain her multivitamin with minerals, vitamin A & D, senna-docusate, feeding supplement (ENSURE ENLIVE), ferrous sulfate, collagenase, pravastatin, amLODipine, levothyroxine, clopidogrel, hydroxyurea, and iron polysaccharides.   Rachel [name] orders of the defined types were placed in this encounter.    Follow-up: Rachel [name] follow-ups on file.  Mauricio Po, [place] for Infectious Disease

## 2017-08-05 ENCOUNTER — Ambulatory Visit: Payer: Medicare HMO | Admitting: Family

## 2017-08-08 IMAGING — CT CT HEAD W/O CM
2 series · 15 of 30 positions shown, 17 images · non-contrast
Comparison: None

CLINICAL DATA: Altered mental status, history of dementia. Left
facial droop

EXAM:
CT HEAD WITHOUT CONTRAST
TECHNIQUE: Contiguous axial images were obtained from the base of the skull
through the vertex without contrast.

[Series 2: head without · axial · non-contrast · 0.43mm/px · z∈[+1175,+1310]mm · 7 of 37 slices shown, 9 images]
[im 5/37  brain]
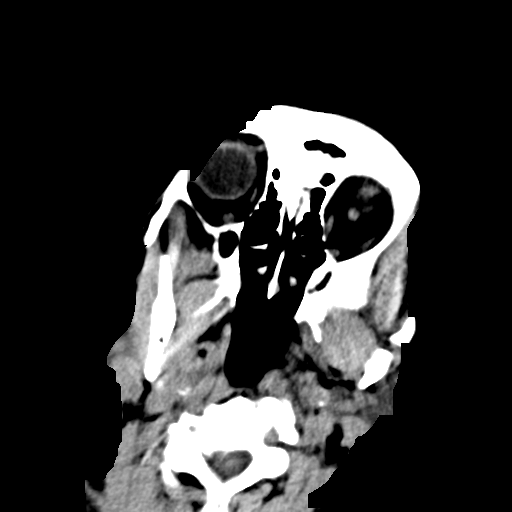
[im 5/37  bone]
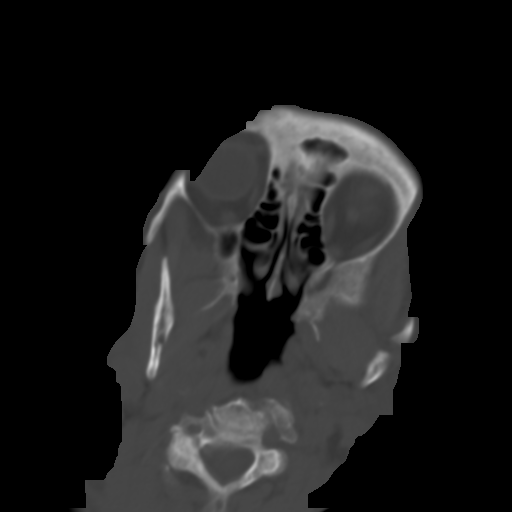
[im 10/37  brain]
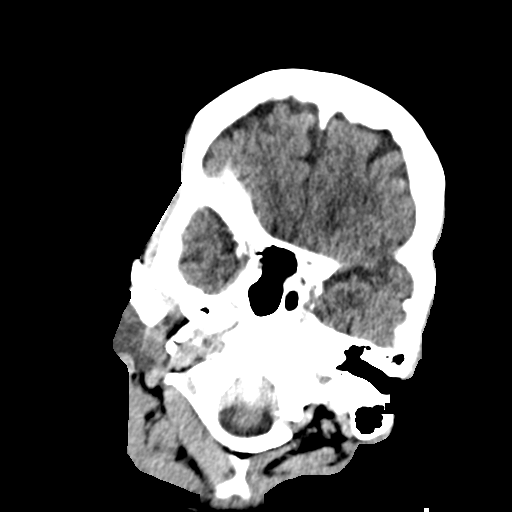
[im 14/37  brain]
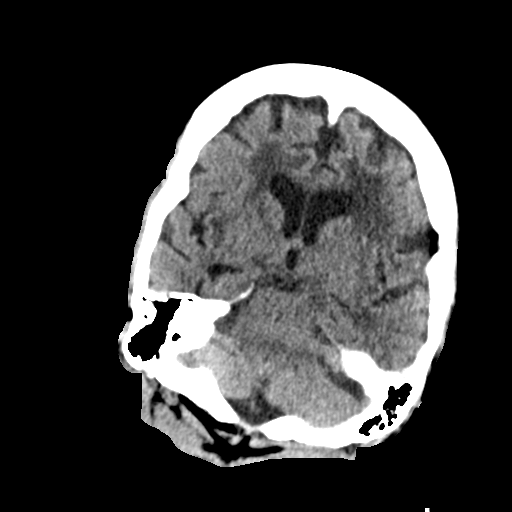
[im 19/37  brain]
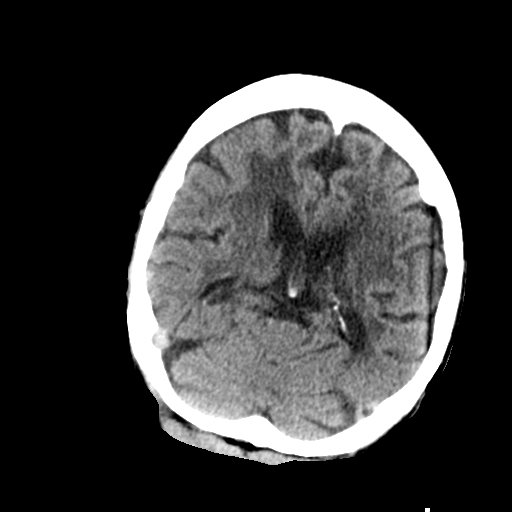
[im 23/37  brain]
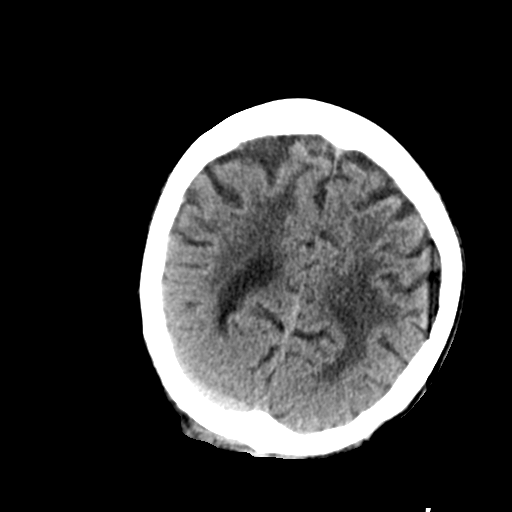
[im 23/37  bone]
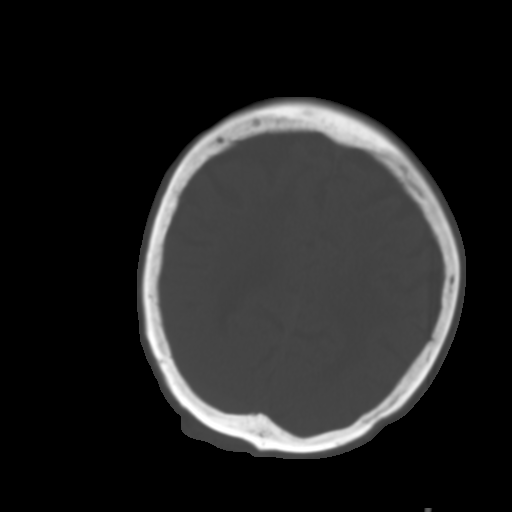
[im 28/37  brain]
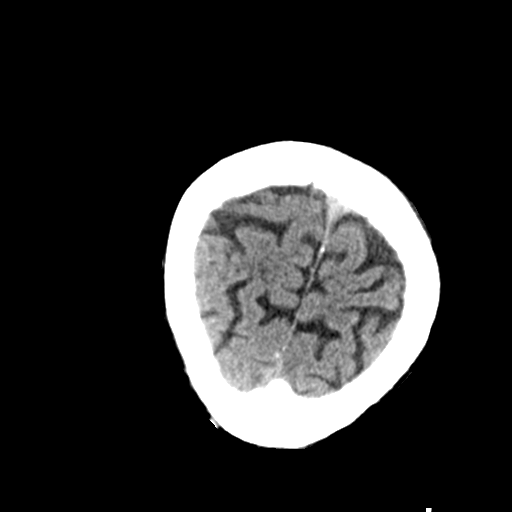
[im 32/37  brain]
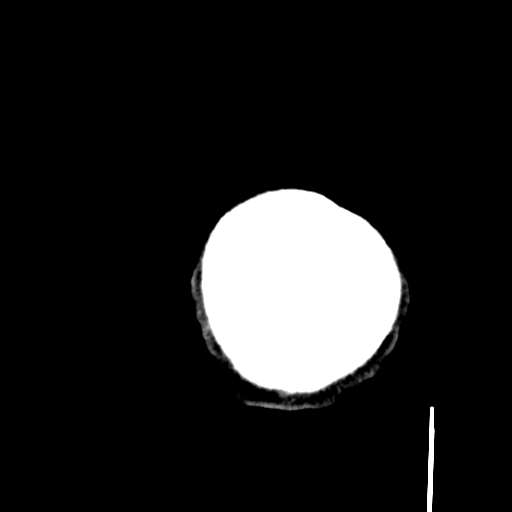

[Series 3: head bone · axial · 0.43mm/px · z∈[+1173,+1317]mm · 8 of 91 slices shown]
[im 10/91  bone]
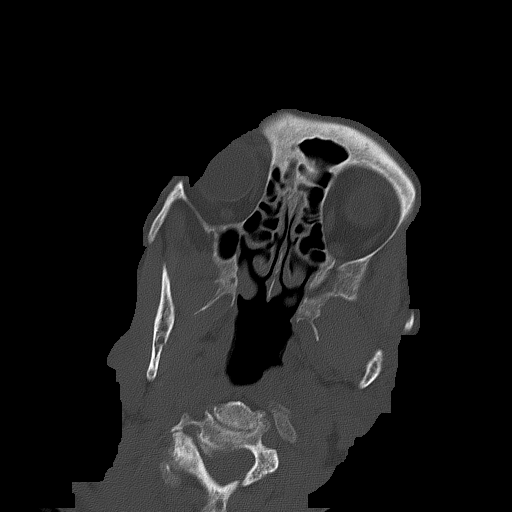
[im 19/91  bone]
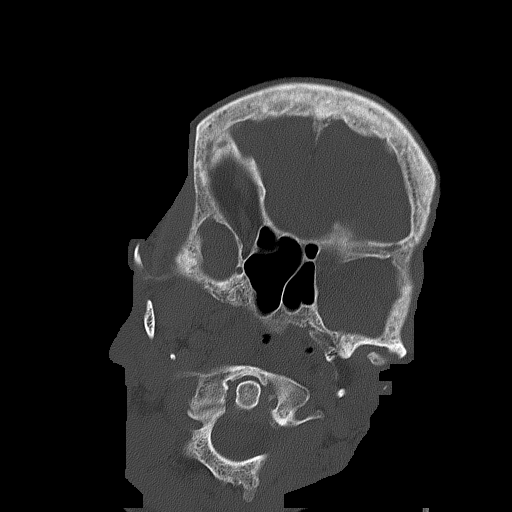
[im 28/91  bone]
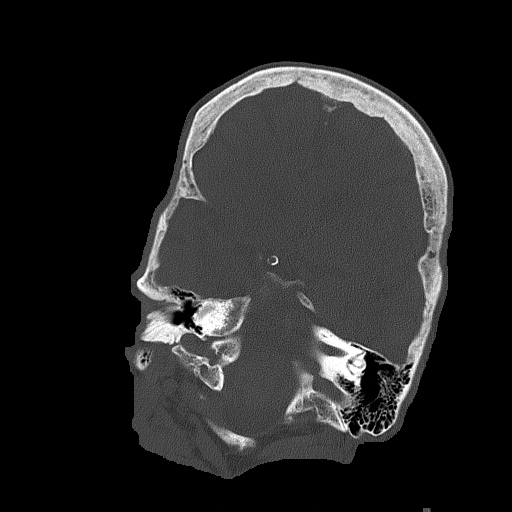
[im 41/91  bone]
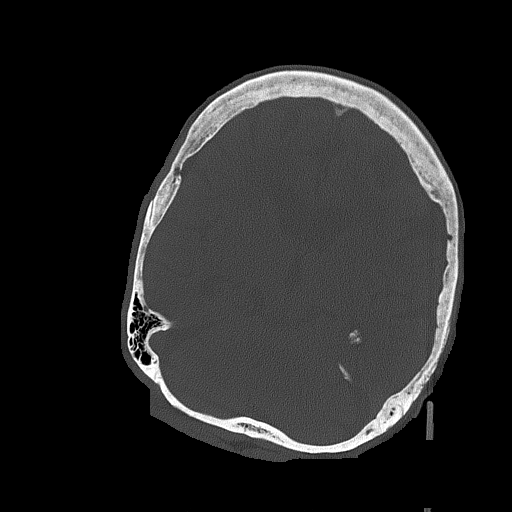
[im 50/91  bone]
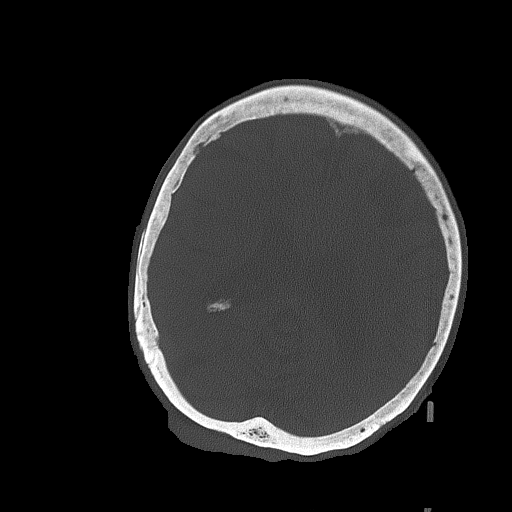
[im 64/91  bone]
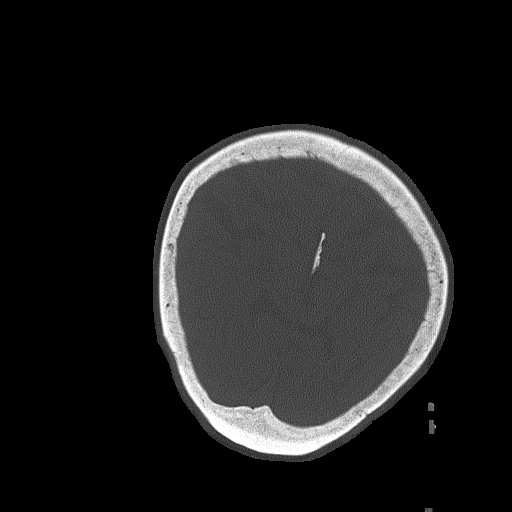
[im 73/91  bone]
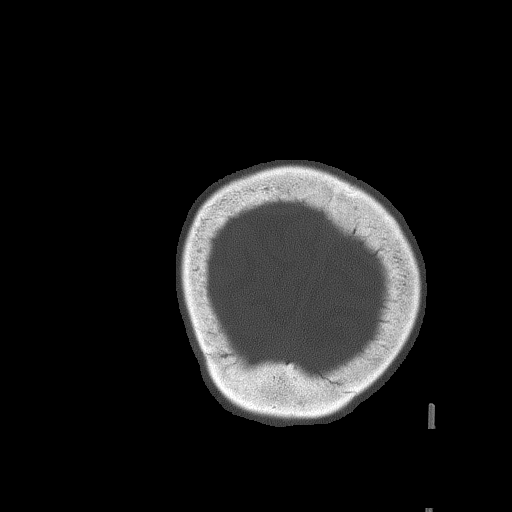
[im 82/91  bone]
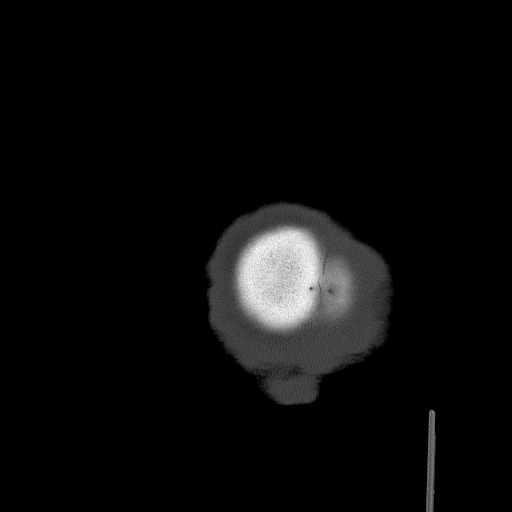

[15 of 30 positions shown; findings below may reference images not displayed]

FINDINGS: Diffuse brain atrophy with chronic white matter microvascular
ischemic changes throughout the cerebral hemispheres. Cerebellar
atrophy as well. No acute intracranial hemorrhage, definite
infarction, mass lesion, midline shift, herniation, hydrocephalus,
or extra-axial fluid collection. Orbits are symmetric. Mastoids and
sinuses remain clear. Limited exam because of patient positioning.
IMPRESSION: Chronic atrophy and white matter microvascular ischemic changes.

No acute intracranial finding by noncontrast CT.

## 2017-09-14 ENCOUNTER — Ambulatory Visit (INDEPENDENT_AMBULATORY_CARE_PROVIDER_SITE_OTHER): Payer: Medicare HMO | Admitting: Internal Medicine

## 2017-09-14 ENCOUNTER — Other Ambulatory Visit: Payer: Self-pay

## 2017-09-14 ENCOUNTER — Encounter: Payer: Self-pay | Admitting: Internal Medicine

## 2017-09-14 VITALS — BP 127/69 | HR 63 | Temp 98.3°F | Ht 64.0 in

## 2017-09-14 DIAGNOSIS — D509 Iron deficiency anemia, unspecified: Secondary | ICD-10-CM | POA: Diagnosis not present

## 2017-09-14 DIAGNOSIS — D473 Essential (hemorrhagic) thrombocythemia: Secondary | ICD-10-CM

## 2017-09-14 DIAGNOSIS — Z79899 Other long term (current) drug therapy: Secondary | ICD-10-CM

## 2017-09-14 DIAGNOSIS — I10 Essential (primary) hypertension: Secondary | ICD-10-CM

## 2017-09-14 DIAGNOSIS — D500 Iron deficiency anemia secondary to blood loss (chronic): Secondary | ICD-10-CM

## 2017-09-14 DIAGNOSIS — D471 Chronic myeloproliferative disease: Secondary | ICD-10-CM

## 2017-09-14 DIAGNOSIS — Z89612 Acquired absence of left leg above knee: Secondary | ICD-10-CM

## 2017-09-14 DIAGNOSIS — Z7902 Long term (current) use of antithrombotics/antiplatelets: Secondary | ICD-10-CM | POA: Diagnosis not present

## 2017-09-14 DIAGNOSIS — C946 Myelodysplastic disease, not classified: Secondary | ICD-10-CM

## 2017-09-14 DIAGNOSIS — D693 Immune thrombocytopenic purpura: Secondary | ICD-10-CM | POA: Diagnosis not present

## 2017-09-14 MED ORDER — HYDROXYUREA 500 MG PO CAPS: ORAL_CAPSULE | ORAL | 3 refills | Status: DC

## 2017-09-14 MED ORDER — AMLODIPINE BESYLATE 5 MG PO TABS: 5.0000 mg | ORAL_TABLET | Freq: Every day | ORAL | 3 refills | Status: DC

## 2017-09-14 MED ORDER — CLOPIDOGREL BISULFATE 75 MG PO TABS: 75.0000 mg | ORAL_TABLET | Freq: Every day | ORAL | 3 refills | Status: AC

## 2017-09-14 MED ORDER — PRAVASTATIN SODIUM 20 MG PO TABS: 20.0000 mg | ORAL_TABLET | Freq: Every day | ORAL | 3 refills | Status: AC

## 2017-09-14 MED ORDER — LEVOTHYROXINE SODIUM 50 MCG PO TABS: 50.0000 ug | ORAL_TABLET | Freq: Every day | ORAL | 3 refills | Status: DC

## 2017-09-14 MED ORDER — POLYSACCHARIDE IRON COMPLEX 150 MG PO CAPS: 150.0000 mg | ORAL_CAPSULE | Freq: Every day | ORAL | 3 refills | Status: AC

## 2017-09-14 NOTE — Assessment & Plan Note (Addendum)
Family requesting a refill on amlodipine today.  They report that she has been out for the past 3 weeks and has not been taking it.  Her blood pressure today is 127/69 they do report that they have a blood pressure cuff at home to check it.    Plan: I will take amlodipine off her medication list.  I have them instructed her granddaughter to restart her blood pressure medications if her blood pressure is persistently elevated at home otherwise we will discontinue the amlodipine.

## 2017-09-14 NOTE — Assessment & Plan Note (Signed)
Patient presents for follow-up.  She was seen 06/10/2017 and was started on iron for iron deficiency anemia thought to be contributing to her thrombocytopenia.  She was supposed to follow-up after month but her granddaughter who cares for her had a fall and was unable to bring her to that appointment.  They report that she has been taking iron pills without any issue.  She reports that he is having 4-5 bowel movements a week without any issues with constipation.  Assessment and plan: We will recheck her CBC today to reassess her thrombocytopenia.  Continue iron supplementation.

## 2017-09-14 NOTE — Assessment & Plan Note (Signed)
Requesting refill on her hydroxyurea today.  Will refill.

## 2017-09-14 NOTE — Progress Notes (Signed)
   CC: Follow-up for iron deficiency and medication refills  HPI:  [name] is a 84 y.o. female with a past medical history listed below here today for follow up of her deficiency anemia medication refills.  For details of today's visit and the status of her chronic medical issues please refer to the assessment and plan.   Past Medical History:  Diagnosis Date  . Dementia   . Hyperlipidemia   . Hypertension   . Hypothyroidism   . Myeloproliferative disorder ([place]) 10/12/2015   essential thrombocythemia likely present since 02/2015  . Pressure ulcer of right heel, stage 4 ([place]) 04/15/2015  . Severe protein-calorie malnutrition ([place])   . Stroke [facility])    Left sided weakness  . Thrombocytosis ([place]) 04/13/2015   Review of Systems:   No chest pain or shortness of breath  Physical Exam:  Vitals:   09/14/17 1017  BP: 127/69  Pulse: 63  Temp: 98.3 F (36.8 C)  TempSrc: Oral  Height: 5\' 4"  (1.626 m)   GENERAL- alert, co-operative, elderly woman who is somnolent but easily arousable, slumped over in wheelchair in no acute distress. CARDIAC- RRR, no murmurs, rubs or gallops. RESP- Moving equal volumes of air, and clear to auscultation bilaterally, no wheezes or crackles.Marland Kitchen EXTREMITIES- left AKA SKIN- Warm, dry, No rash or lesion.   Assessment & Plan:   See Encounters Tab for problem based charting.  Patient discussed with Dr. Daryll Drown

## 2017-09-14 NOTE — Patient Instructions (Signed)
I am going to recheck your labs today make sure your blood counts look good and your platelet counts are still remaining high.  We will send in refills on her medications as well.  As long as her blood counts return without significant issue it should be okay to go on a trip to [place].  I would recommend frequent stops to try to ensure that she does not get a blood clot.  Also to take all her medications with you.  It may be beneficial to also look for any hospitals along the route just to be on the safe side.  Please go ahead and schedule a follow-up appointment with your regular physician.

## 2017-09-14 NOTE — Assessment & Plan Note (Signed)
Will requesting refill on her Plavix today.  We will recheck a CBC today.

## 2017-09-15 LAB — CBC
HEMOGLOBIN: 7.6 g/dL — AB (ref 11.1–15.9)
Hematocrit: 28.1 % — ABNORMAL LOW (ref 34.0–46.6)
MCH: 17.5 pg — AB (ref 26.6–33.0)
MCHC: 27 g/dL — ABNORMAL LOW (ref 31.5–35.7)
MCV: 65 fL — ABNORMAL LOW (ref 79–97)
Platelets: 804 10*3/uL (ref 150–450)
RBC: 4.34 x10E6/uL (ref 3.77–5.28)
RDW: 23.8 % — ABNORMAL HIGH (ref 12.3–15.4)
WBC: 13.6 10*3/uL — AB (ref 3.4–10.8)

## 2017-09-16 ENCOUNTER — Telehealth: Payer: Self-pay | Admitting: *Deleted

## 2017-09-16 NOTE — Telephone Encounter (Signed)
Left message with great granddaughter to have her mom call office today to review results. Will also need to schedule appt in short stay for blood transfusion. Hubbard Hartshorn, RN, BSN     Notes recorded by Maryellen Pile, MD on 09/16/2017 at 10:50 AM EDT Hgb 7.6. Discussed with her hematologist, Dr. Beryle Beams. Recommended transfusing 2 units PRBC and then restarting Hydrea after the transfusion. Called patient and primary caregiver (granddaughter) without any answer. Will reach out and instruct to hold the Hydrea until after transfusion and follow up in clinic next week.

## 2017-09-16 NOTE — Addendum Note (Signed)
Addended by: Slater Lions on: 09/16/2017 10:38 AM   Modules accepted: Orders, SmartSet

## 2017-09-16 NOTE — Telephone Encounter (Addendum)
Granddaughter came to clinic. Discussed need for blood transfusion this week. Scheduled with Short Stay for Fri 6//28/2019 at 0800. F/u sched in [facility] 09/25/2017 at 0915. Granddaughter aware to hold hydroxyurea till Sat 09/19/2017 following transfusion per Dr. Charlynn Grimes. Instructed not to increase fluid intake 2/2 CKD but may eat whatever she wants. Granddaughter states patient's stools are brown not black or tarry looking. Has missed iron supplement for 1 week. Assured that this would not cause significant drop in Hgb. Granddaughter very Patent attorney. Hubbard Hartshorn, RN, BSN

## 2017-09-17 NOTE — Telephone Encounter (Signed)
Noted thanks °

## 2017-09-17 NOTE — Progress Notes (Signed)
Internal Medicine Clinic Attending  Case discussed with Dr. Boswell at the time of the visit.  We reviewed the resident's history and exam and pertinent patient test results.  I agree with the assessment, diagnosis, and plan of care documented in the resident's note.  

## 2017-09-18 ENCOUNTER — Ambulatory Visit (HOSPITAL_COMMUNITY)
Admission: RE | Admit: 2017-09-18 | Discharge: 2017-09-18 | Disposition: A | Discharge status: Home / Self Care | Payer: Medicare HMO | Source: Ambulatory Visit | Attending: Oncology | Admitting: Oncology

## 2017-09-18 DIAGNOSIS — D500 Iron deficiency anemia secondary to blood loss (chronic): Secondary | ICD-10-CM

## 2017-09-18 LAB — PREPARE RBC (CROSSMATCH)

## 2017-09-18 MED ORDER — SODIUM CHLORIDE 0.9% IV SOLUTION: Freq: Once | INTRAVENOUS | Status: DC

## 2017-09-18 NOTE — Progress Notes (Signed)
Client received from section B and  Report received from [facility]

## 2017-09-19 LAB — BPAM RBC
BLOOD PRODUCT EXPIRATION DATE: 201907272359
Blood Product Expiration Date: 201907262359
ISSUE DATE / TIME: 201906281203
ISSUE DATE / TIME: 201906281447
UNIT TYPE AND RH: 5100
Unit Type and Rh: 5100

## 2017-09-19 LAB — TYPE AND SCREEN
ABO/RH(D): AB POS
Antibody Screen: POSITIVE
UNIT DIVISION: 0
Unit division: 0

## 2017-09-25 ENCOUNTER — Ambulatory Visit (INDEPENDENT_AMBULATORY_CARE_PROVIDER_SITE_OTHER): Payer: Medicare HMO | Admitting: Internal Medicine

## 2017-09-25 ENCOUNTER — Other Ambulatory Visit: Payer: Self-pay

## 2017-09-25 VITALS — BP 158/78 | HR 51 | Temp 98.2°F | Ht 64.0 in

## 2017-09-25 DIAGNOSIS — D471 Chronic myeloproliferative disease: Secondary | ICD-10-CM | POA: Diagnosis not present

## 2017-09-25 DIAGNOSIS — D509 Iron deficiency anemia, unspecified: Secondary | ICD-10-CM

## 2017-09-25 DIAGNOSIS — Z993 Dependence on wheelchair: Secondary | ICD-10-CM

## 2017-09-25 DIAGNOSIS — D693 Immune thrombocytopenic purpura: Secondary | ICD-10-CM

## 2017-09-25 DIAGNOSIS — Z89612 Acquired absence of left leg above knee: Secondary | ICD-10-CM

## 2017-09-25 DIAGNOSIS — D500 Iron deficiency anemia secondary to blood loss (chronic): Secondary | ICD-10-CM

## 2017-09-25 DIAGNOSIS — I69354 Hemiplegia and hemiparesis following cerebral infarction affecting left non-dominant side: Secondary | ICD-10-CM

## 2017-09-25 DIAGNOSIS — E039 Hypothyroidism, unspecified: Secondary | ICD-10-CM

## 2017-09-25 DIAGNOSIS — Z79899 Other long term (current) drug therapy: Secondary | ICD-10-CM | POA: Diagnosis not present

## 2017-09-25 DIAGNOSIS — Z9889 Other specified postprocedural states: Secondary | ICD-10-CM

## 2017-09-25 DIAGNOSIS — I10 Essential (primary) hypertension: Secondary | ICD-10-CM | POA: Diagnosis not present

## 2017-09-25 LAB — CBC WITH DIFFERENTIAL/PLATELET
BASOS PCT: 2 %
Band Neutrophils: 0 %
Basophils Absolute: 0.2 10*3/uL — ABNORMAL HIGH (ref 0.0–0.1)
Blasts: 0 %
EOS PCT: 2 %
Eosinophils Absolute: 0.2 10*3/uL (ref 0.0–0.7)
HCT: 40.6 % (ref 36.0–46.0)
HEMOGLOBIN: 11.7 g/dL — AB (ref 12.0–15.0)
LYMPHS ABS: 1.9 10*3/uL (ref 0.7–4.0)
Lymphocytes Relative: 20 %
MCH: 21.1 pg — ABNORMAL LOW (ref 26.0–34.0)
MCHC: 28.8 g/dL — ABNORMAL LOW (ref 30.0–36.0)
MCV: 73.2 fL — ABNORMAL LOW (ref 78.0–100.0)
METAMYELOCYTES PCT: 0 %
MONO ABS: 0.6 10*3/uL (ref 0.1–1.0)
MYELOCYTES: 0 %
Monocytes Relative: 6 %
Neutro Abs: 6.6 10*3/uL (ref 1.7–7.7)
Neutrophils Relative %: 70 %
Other: 0 %
PLATELETS: 226 10*3/uL (ref 150–400)
PROMYELOCYTES RELATIVE: 0 %
RBC: 5.55 MIL/uL — ABNORMAL HIGH (ref 3.87–5.11)
RDW: 32.5 % — ABNORMAL HIGH (ref 11.5–15.5)
WBC: 9.5 10*3/uL (ref 4.0–10.5)
nRBC: 0 /100 WBC

## 2017-09-25 NOTE — Patient Instructions (Signed)
It was great meeting you guys today.    Today I will check your blood counts and platelet counts to see how you responded to the transfusions and also the Hydroxyurea. Please continue taking this medication daily. I will call you with any changes to your regimen.   Please ask the front desk to schedule you an appointment with BOTH Dr. Beryle Beams (Hematology) and Dr. Evette Doffing ( primary care physician).

## 2017-09-25 NOTE — Assessment & Plan Note (Signed)
Assessment:  Hb today 11.7 with MCV 73 after transfusion of 2 units pRBCs 6/28 for Hb 7.6. Family continues to deny obvious blood loss and note she's continuing to take her daily iron supplement. It seems that her chronic iron deficiency anemia is felt to be related to chronic GI losses: given her advanced age, colonoscopy has been deferred.      Plan: Hemoglobin responded well to transfusion. Family has not appreciated any overt blood loss. Will continue with PO Iron supplementation for now. Have scheduled an appointment with her hematologist and PCP in 2 weeks for continued follow-up.

## 2017-09-25 NOTE — Assessment & Plan Note (Addendum)
Assessment & Plan: Patient with history of essential thrombocytopenia currently managed with daily hydroxyurea. Family reports she's taking 500mg  daily and her platelets are now 235,000 from 800,000 last week. Family to continue current regimen, but have asked them to take 1000mg  on Monday and 500mg  every other day as prescribed.  -Pt has follow-up appointment with hematology 7/22

## 2017-09-25 NOTE — Progress Notes (Signed)
   CC: follow-up of anemia, thrombocytosis  HPI:  [name] is a 84 y.o. F with myloproliferative disease with thrombocytosis and anemia, dementia, history of CVA with residual deficit, HTN and hypothyroidism who presents today for follow-up of anemia. She presents with her granddaughter who provides the history as patient is non-verbal.   For details regarding today's visit and the status of their chronic medical issues, please refer to the assessment and plan.  Past Medical History:  Diagnosis Date  . Dementia   . Hyperlipidemia   . Hypertension   . Hypothyroidism   . Myeloproliferative disorder ([place]) 10/12/2015   essential thrombocythemia likely present since 02/2015  . Pressure ulcer of right heel, stage 4 ([place]) 04/15/2015  . Severe protein-calorie malnutrition ([place])   . Stroke [facility])    Left sided weakness  . Thrombocytosis ([place]) 04/13/2015   Review of Systems: Obtained from granddaughter who is her primary caretaker General: Denies fevers, changes in mental status HEENT: Denies epistaxis or hematemesis Pulmonary: Denies cough, wheezing or hemoptysis  Abd: Denies abdominal pain, melena, hematochezia  Physical Exam: General: Frail AA female leaning to left in wheelchair. Alert, in no acute distress. Does not respond to questions. Tracks me throughout room.  HEENT: No icterus, injection or ptosis. Oropharynx clear.  Cardiac: RRR, no MGR Pulmonary: CTA BL with normal WOB on RA. Saturating 100% on RA. Abd: Thin. Soft, non-tender. +bs Extremities: Warm, perfused. No edema. Left AKA.  Vitals:   09/25/17 0950 09/25/17 1024  BP: (!) 189/37 (!) 158/78  Pulse: (!) 58 (!) 51  Temp: 98.2 F (36.8 C)   TempSrc: Oral   SpO2: 100%   Height: 5\' 4"  (1.626 m)    Body mass index is 17.87 kg/m.  Assessment & Plan:   See Encounters Tab for problem based charting.  Patient discussed with Dr. Beryle Beams

## 2017-09-28 NOTE — Progress Notes (Signed)
Medicine attending: Medical history, presenting problems, physical findings, and medications, reviewed with resident physician Dr Romelle Starcher Molt on the day of the patient visit and I concur with her evaluation and management plan. Patient with essential thrombocythemia that I co-manage with Internal Med service. 84 y/o; handicapped from prior CVAs. On Hydrea 500 mg daily to control platelet count. Unexpected significant fall in Hb on last CBC. Platelet count rose to >800,000. She was transfused 2 units of blood. Repeat lab today w fall in platelet count back to baseline. No dose adjustment in Hydrea needed. We will coordinate her appt with me and Gen Int med for same day for her convenience.

## 2017-10-12 ENCOUNTER — Encounter: Payer: Self-pay | Admitting: Student in an Organized Health Care Education/Training Program

## 2017-10-12 ENCOUNTER — Ambulatory Visit: Payer: Medicare HMO | Admitting: Oncology

## 2017-10-12 ENCOUNTER — Ambulatory Visit: Payer: Medicare HMO | Admitting: Student in an Organized Health Care Education/Training Program

## 2017-12-16 ENCOUNTER — Other Ambulatory Visit: Payer: Self-pay

## 2017-12-16 ENCOUNTER — Ambulatory Visit (INDEPENDENT_AMBULATORY_CARE_PROVIDER_SITE_OTHER): Payer: Medicare HMO | Admitting: Internal Medicine

## 2017-12-16 VITALS — BP 182/82 | HR 51 | Temp 98.0°F | Ht 64.0 in

## 2017-12-16 DIAGNOSIS — N189 Chronic kidney disease, unspecified: Secondary | ICD-10-CM | POA: Diagnosis not present

## 2017-12-16 DIAGNOSIS — Z79899 Other long term (current) drug therapy: Secondary | ICD-10-CM

## 2017-12-16 DIAGNOSIS — D693 Immune thrombocytopenic purpura: Secondary | ICD-10-CM

## 2017-12-16 DIAGNOSIS — I129 Hypertensive chronic kidney disease with stage 1 through stage 4 chronic kidney disease, or unspecified chronic kidney disease: Secondary | ICD-10-CM

## 2017-12-16 DIAGNOSIS — R8290 Unspecified abnormal findings in urine: Secondary | ICD-10-CM | POA: Diagnosis not present

## 2017-12-16 DIAGNOSIS — F0390 Unspecified dementia without behavioral disturbance: Secondary | ICD-10-CM

## 2017-12-16 DIAGNOSIS — E039 Hypothyroidism, unspecified: Secondary | ICD-10-CM | POA: Diagnosis not present

## 2017-12-16 DIAGNOSIS — Z89619 Acquired absence of unspecified leg above knee: Secondary | ICD-10-CM

## 2017-12-16 DIAGNOSIS — I739 Peripheral vascular disease, unspecified: Secondary | ICD-10-CM | POA: Diagnosis not present

## 2017-12-16 DIAGNOSIS — D509 Iron deficiency anemia, unspecified: Secondary | ICD-10-CM | POA: Diagnosis not present

## 2017-12-16 DIAGNOSIS — C946 Myelodysplastic disease, not classified: Secondary | ICD-10-CM | POA: Diagnosis not present

## 2017-12-16 DIAGNOSIS — I10 Essential (primary) hypertension: Secondary | ICD-10-CM

## 2017-12-16 DIAGNOSIS — M79602 Pain in left arm: Secondary | ICD-10-CM

## 2017-12-16 DIAGNOSIS — I69354 Hemiplegia and hemiparesis following cerebral infarction affecting left non-dominant side: Secondary | ICD-10-CM | POA: Diagnosis not present

## 2017-12-16 MED ORDER — AMLODIPINE BESYLATE 5 MG PO TABS: 5.0000 mg | ORAL_TABLET | Freq: Every day | ORAL | 3 refills | Status: DC

## 2017-12-16 NOTE — Progress Notes (Signed)
CC: Left arm pain  HPI:  Ms.Zylpha Craig-Rudd is a 84 y.o.  female with a PVD, HTN, hypothyroidism, CKD, essential thrombocythemia, iron deficiency anemia, myeloproliferative disorder, above the knee amputation who presents for left arm pain.   She is accompanied by her granddaughter and grandson who provides most of the history. Ms. Bronk has dementia and it is difficult to elicit answers from her.   Left Arm and Hand Pain Over the last three days her family has noticed left upper extremity edema which has now resolved. Her grand-daughter also states that the patient has been complaining of left arm and hand pain. Ms. Heckert reports "some pain" to her left hand but denies any pain in her arm. Per her granddaughter, Ms. Craig-Rudd has been leaning on her left-side for years after sustaining a stroke with residual left-sided weakness. Her family attempts to prop her up on her right sided but the patient will immediately move to the left.   Hypertension Ms. Craig-Rudd is not currently on an anti-hypertensive medication. Her blood pressure today was elevated at 182/82 (2 months ago: 158/78). She was on amlodipine 5 mg but that medication was discontinued because of she had not been taking it for several weeks and her blood pressure was within normal limits.   Cloudy Urine Her grand-daughter states that she has had several weeks of "cloudy urine". She denies dysuria, hematuria, mental status changes. She normally urinates into a diaper and it is difficult to determine whether she has had increased frequency or urgency.   Past Medical History:  Diagnosis Date  . Dementia   . Hyperlipidemia   . Hypertension   . Hypothyroidism   . Myeloproliferative disorder ([place]) 10/12/2015   essential thrombocythemia likely present since 02/2015  . Pressure ulcer of right heel, stage 4 ([place]) 04/15/2015  . Severe protein-calorie malnutrition ([place])   . Stroke [facility])    Left sided weakness  .  Thrombocytosis ([place]) 04/13/2015   Review of Systems:   Review of Systems  Constitutional: Negative for chills, fever and weight loss.  HENT: Negative for congestion and sore throat.   Respiratory: Negative for cough and shortness of breath.   Cardiovascular: Negative for chest pain and palpitations.  Gastrointestinal: Negative for abdominal pain, diarrhea, nausea and vomiting.  Genitourinary: Negative for dysuria and hematuria.       Cloudy urine  Musculoskeletal:       Left arm and hand pain  Skin: Negative for itching and rash.  Neurological: Negative for dizziness and headaches.  All other systems reviewed and are negative.   Physical Exam:  Vitals:   12/16/17 1348  BP: (!) 182/82  Pulse: (!) 51  Temp: 98 F (36.7 C)  TempSrc: Oral  Height: 5\' 4"  (1.626 m)   Physical Exam  Constitutional:  Frail older female leaning on her left-sided in her wheelchair.   HENT:  Head: Normocephalic and atraumatic.  Eyes: EOM are normal.  Cardiovascular: Regular rhythm and normal heart sounds.  Pulmonary/Chest:  Breath sounds normal during auscultation of the anterior chest.  Abdominal: Soft. Bowel sounds are normal.  Musculoskeletal:  No clear deformities or edema noted to the left or right upper extremity. No tenderness to palpation of the left arm and hands.  Left above the knee amputation. No right LE edema.  Neurological: She is alert.  Skin: Skin is warm and dry.  Psychiatric: She has a normal mood and affect. Her behavior is normal.  Nursing note and vitals reviewed.   Assessment &  Plan:   See Encounters Tab for problem based charting.  Patient seen with Dr. Daryll Drown

## 2017-12-21 DIAGNOSIS — R8290 Unspecified abnormal findings in urine: Secondary | ICD-10-CM | POA: Insufficient documentation

## 2017-12-21 DIAGNOSIS — M79602 Pain in left arm: Secondary | ICD-10-CM | POA: Insufficient documentation

## 2017-12-21 NOTE — Assessment & Plan Note (Signed)
Assessment: Rachel [name] has a several week history of "cloudy urine" or unclear etiology. She does not have symptoms of UTI (dysuria and hematuria) and I do not see the utility of performing a urinary catheter and urinalysis at this time.  Plan: 1. Continue to monitor. If she develops dysuria, hematuria, mental status changes, will consider urinalysis to evaluate for urinary tract infection.

## 2017-12-21 NOTE — Assessment & Plan Note (Signed)
Assessment: Rachel [name] has a 3 day history of left arm and hand pain (continues to complain of hand pain) with upper extremity edema which has now resolved. Her physical exam was unremarkable today. I suspect that her pain is musculoskeletal in nature and is likely caused by her constantly leaning on that arm. I recommend that her family continues to prop her on her right side. If pain worsens or swelling returns, I recommend following-up so that she can be re-evaluated.  Plan: 1. Recommend propping her on her right sided to alleviate pressure to the right upper extremity.

## 2017-12-21 NOTE — Assessment & Plan Note (Signed)
Assessment: Rachel [name] blood pressure is elevated today at 182/82 while on no anti-hypertensive medications. She has previously done well on amlodipine 5 mg QD and this was discontinued due to medical non-adherence with subsequent normal blood pressure.  Plan: 1. Restart amlodipine 5 mg QD 2. Return in 2 weeks for blood pressure check

## 2017-12-22 NOTE — Progress Notes (Signed)
Internal Medicine Clinic Attending  I saw and evaluated the patient.  I personally confirmed the key portions of the history and exam documented by Dr. Prince and I reviewed pertinent patient test results.  The assessment, diagnosis, and plan were formulated together and I agree with the documentation in the resident's note.  

## 2018-02-08 ENCOUNTER — Encounter: Payer: Self-pay | Admitting: Student in an Organized Health Care Education/Training Program

## 2018-02-08 ENCOUNTER — Other Ambulatory Visit: Payer: Self-pay | Admitting: Student in an Organized Health Care Education/Training Program

## 2018-02-08 ENCOUNTER — Ambulatory Visit: Payer: Medicare HMO

## 2018-02-08 DIAGNOSIS — D471 Chronic myeloproliferative disease: Secondary | ICD-10-CM

## 2018-02-11 ENCOUNTER — Telehealth: Payer: Self-pay | Admitting: Internal Medicine

## 2018-02-11 NOTE — Telephone Encounter (Signed)
  Reason for call:   I placed an outgoing call to [name] at 7:34 PM regarding on going cold symptoms.  I spoke with patients daughter as the patient has dementia (This daughter cares for the patient).  Patient has experienced 2 days of cold symptoms. With runny nose with yellow discharge; Intermitted cough; and being slightly more sluggish than usual but still able to eat, drink at interact at her usual level. Daughter states patient does not have a fever. She did check the patient blood pressure at home, which was soft at 105/58, she typically has high blood pressure but has had lows in the past per her daughter. She otherwise is in no acute respiratory or other distress.   Assessment/ Plan:   Patient likely experiencing viral URI, but given difficulty in communicating her symptoms clearly with underlying dementia. Patient is to come to clinic for a visit in the morning.   Daughter instructed to monitor for fever or further reduction in blood pressure as well in changes in her mothers level of interaction / mental status.   Daughter advised advised that if symptoms worsen or new symptoms arise, they should go to an urgent care facility or to to ER for further evaluation.   Rachel Seat, MD   02/11/2018, 7:40 PM

## 2018-02-26 IMAGING — CT CT HEAD W/O CM
4 series · 16 of 47 positions shown, 18 images · non-contrast
Comparison: Head CT dated 02/27/2015 and brain MRI dated 02/27/2015

CLINICAL DATA: 87-year-old female with confusion

EXAM:
CT HEAD WITHOUT CONTRAST
TECHNIQUE: Contiguous axial images were obtained from the base of the skull
through the vertex without intravenous contrast.

[Series 2: head without · axial · non-contrast · 0.40mm/px · z∈[-157,-37]mm · 7 of 33 slices shown, 9 images]
[im 5/33  brain]
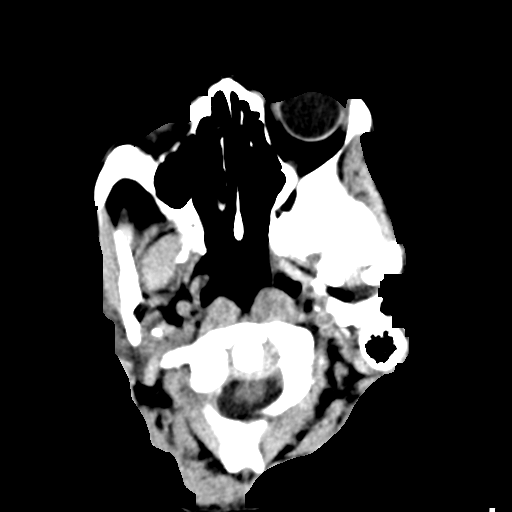
[im 5/33  bone]
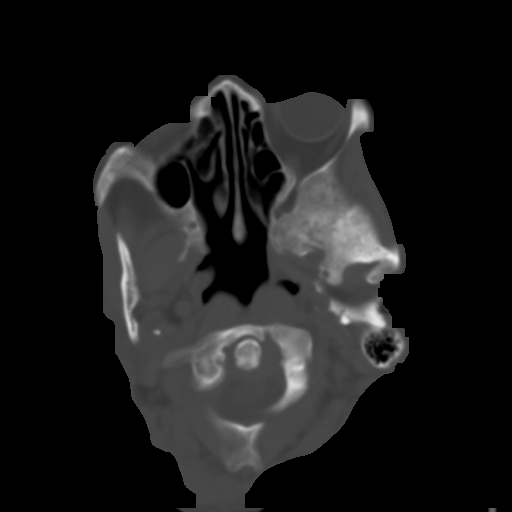
[im 9/33  brain]
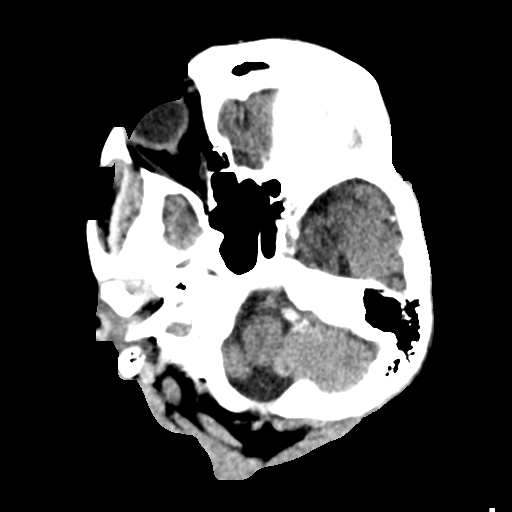
[im 13/33  brain]
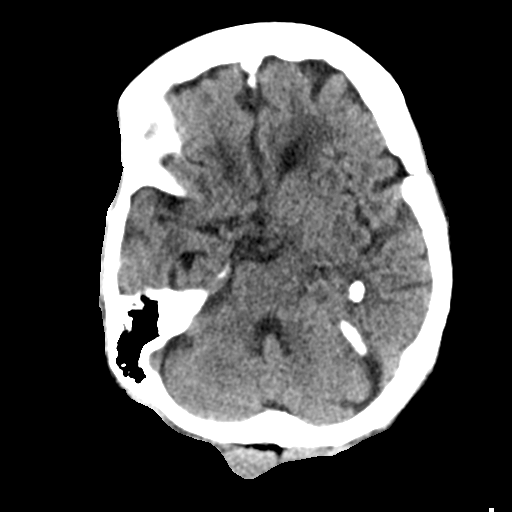
[im 17/33  brain]
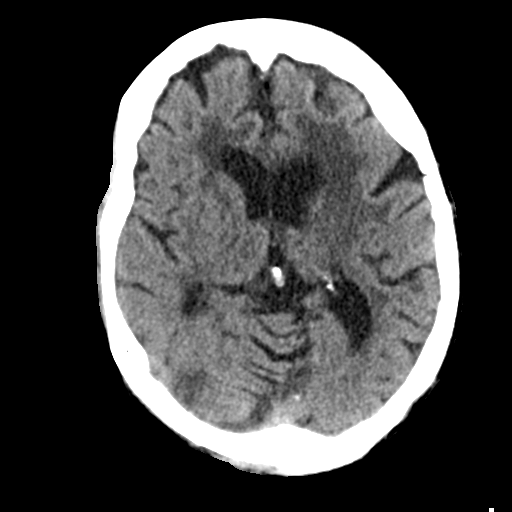
[im 21/33  brain]
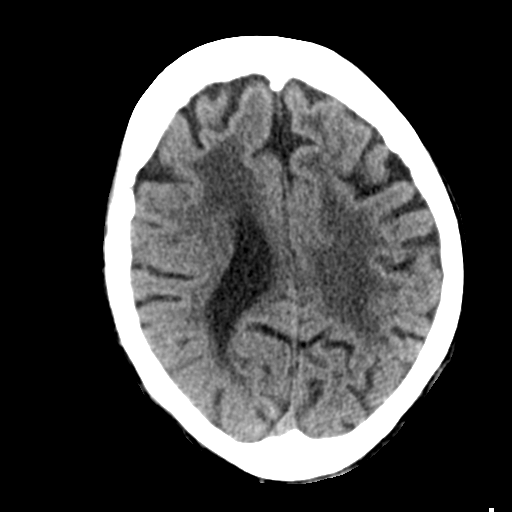
[im 21/33  bone]
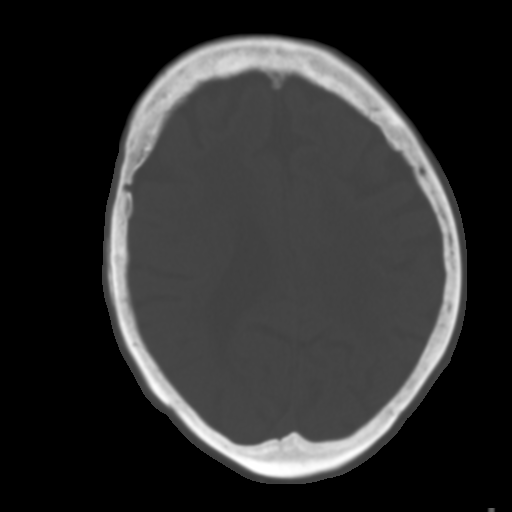
[im 25/33  brain]
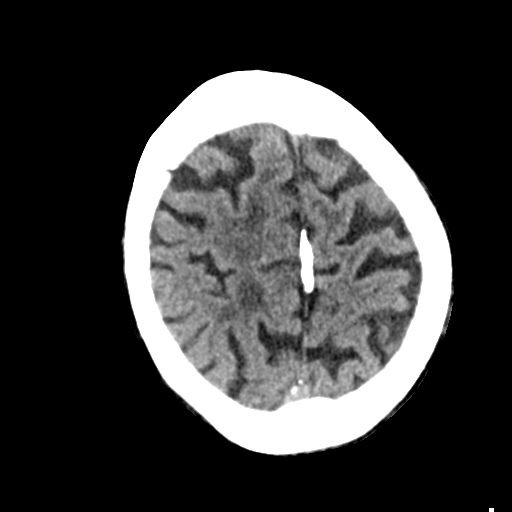
[im 29/33  brain]
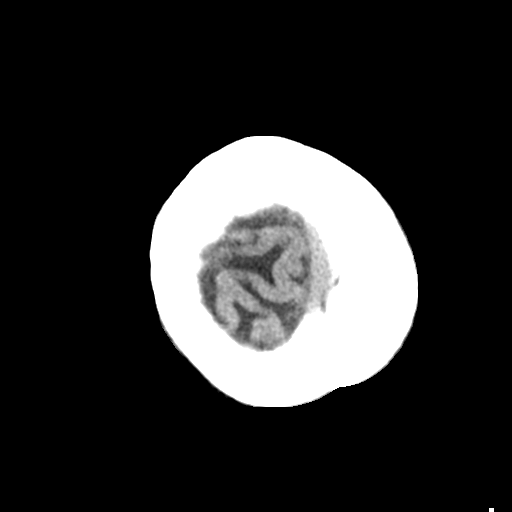

[Series 3: head bone · axial · 0.40mm/px · z∈[-161,-129]mm · 3 of 82 slices shown]
[im 9/82  bone]
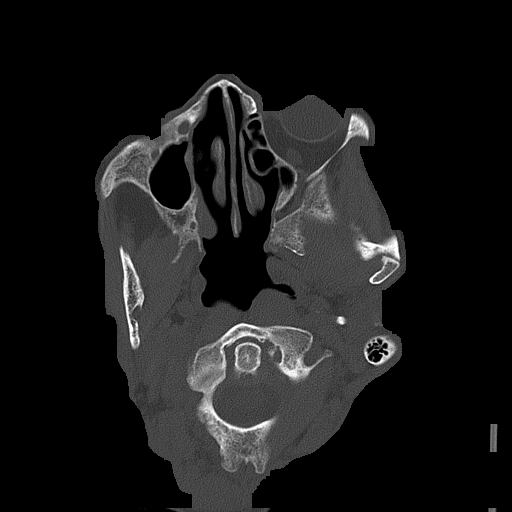
[im 17/82  bone]
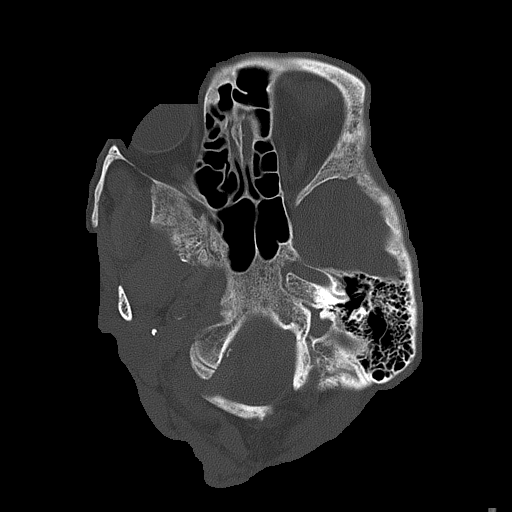
[im 25/82  bone]
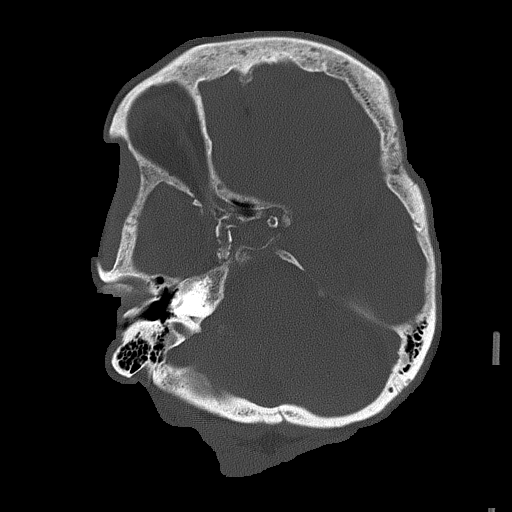

[Series 4: head without cor · coronal · non-contrast · 0.32mm/px · 3 of 67 slices shown]
[im 23/67  brain]
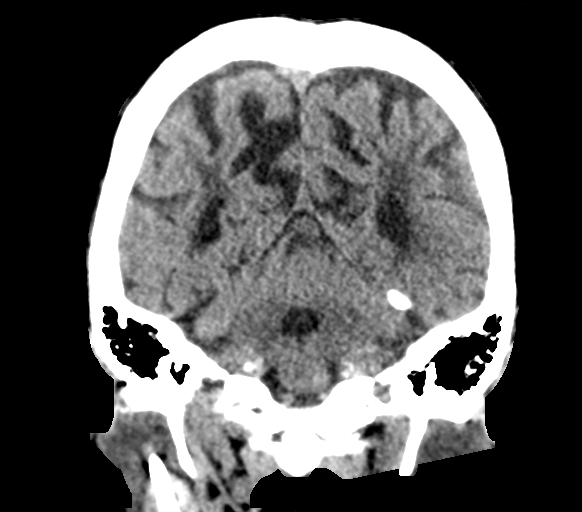
[im 30/67  brain]
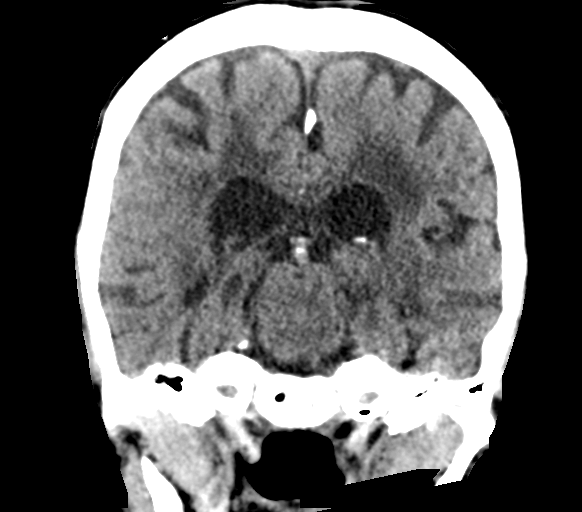
[im 37/67  brain]
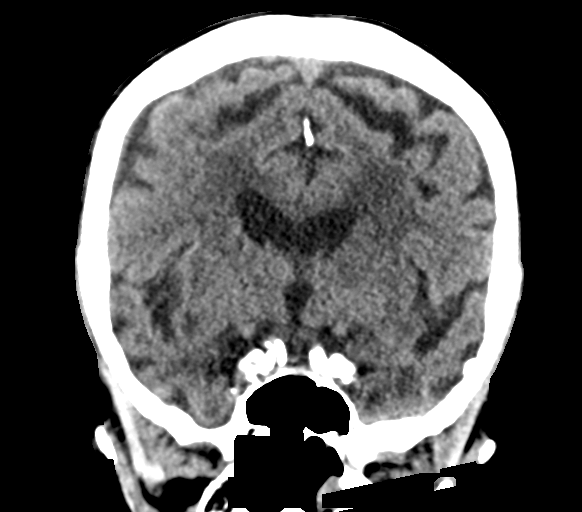

[Series 5: head without sag · sagittal · non-contrast · 0.32mm/px · 3 of 53 slices shown]
[im 22/53  brain]
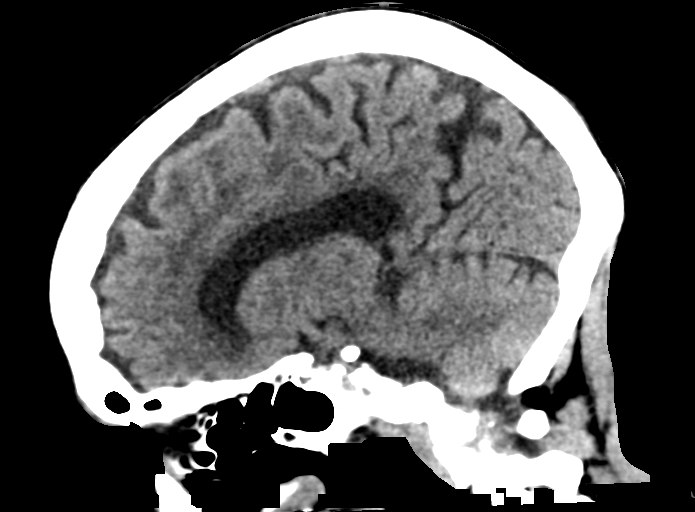
[im 27/53  brain]
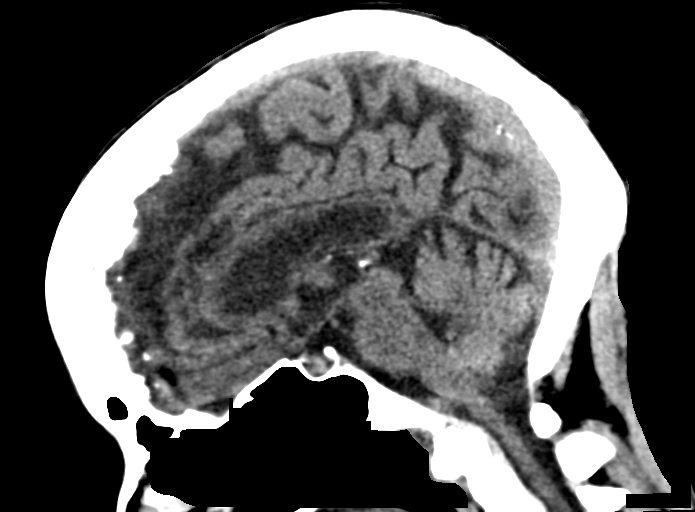
[im 31/53  brain]
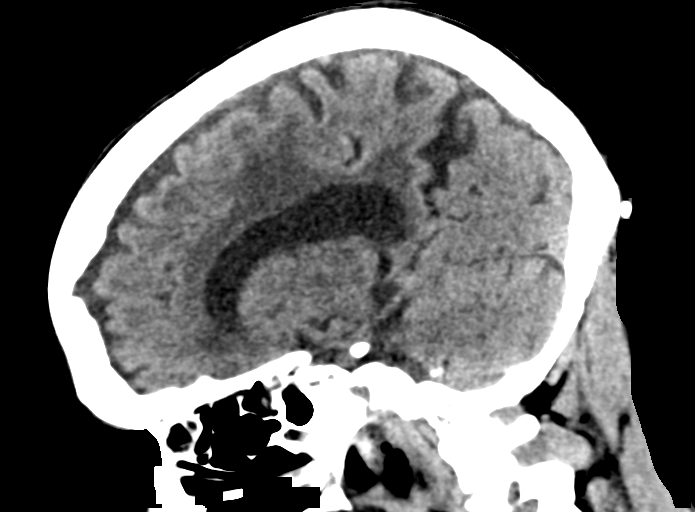

[16 of 47 positions shown; findings below may reference images not displayed]

FINDINGS: There is moderate age-related atrophy and chronic microvascular
ischemic changes. There is no acute intracranial hemorrhage. No mass
effect or midline shift noted.

The visualized paranasal sinuses and mastoid air cells are clear.
The calvarium is intact.
IMPRESSION: No acute intracranial hemorrhage.

Age-related atrophy and chronic microvascular ischemic disease.

If symptoms persist and there are no contraindications, MRI may
provide better evaluation if clinically indicated.

## 2018-03-08 ENCOUNTER — Other Ambulatory Visit: Payer: Self-pay | Admitting: Oncology

## 2018-03-08 DIAGNOSIS — D471 Chronic myeloproliferative disease: Secondary | ICD-10-CM

## 2018-03-08 DIAGNOSIS — D473 Essential (hemorrhagic) thrombocythemia: Secondary | ICD-10-CM

## 2018-03-12 NOTE — Progress Notes (Addendum)
CC: follow up of hypertension   HPI:  Rachel [name] is a 84 y.o. with PMH as listed below who presents for follow up of hypertension . Please see the assessment and plans for the status of the patient chronic medical problems.   Past Medical History:  Diagnosis Date  . Dementia ([place])   . Hyperlipidemia   . Hypertension   . Hypothyroidism   . Myeloproliferative disorder ([place]) 10/12/2015   essential thrombocythemia likely present since 02/2015  . Pressure ulcer of right heel, stage 4 ([place]) 04/15/2015  . Severe protein-calorie malnutrition ([place])   . Stroke [facility])    Left sided weakness  . Thrombocytosis ([place]) 04/13/2015   Review of Systems:  Refer to history of present illness and assessment and plans for pertinent review of systems, all others reviewed and negative  Physical Exam:  Vitals:   03/15/18 0914  BP: (!) 195/105  Pulse: 90  Temp: 97.7 F (36.5 C)  TempSrc: Oral  SpO2: 99%   General: no acute distress, holds her head and body bent towards the left  Eyes: no conjunctivitis, no jaundice  Cardiac: regular rate and rhythm, 2/6 systolic loudest over RUSB, rubs, or gallops, 1+ RLE edema Pulm: lungs are clear to auscultation, no wheezes or rhonchi  GI: the abdomen is soft, non distended Skin: no rashes evident on the exposed skin  Extremities: AKA LLE   Assessment & Plan:   Hypertension  BP Readings from Last 3 Encounters:  03/15/18 (!) 195/105  12/16/17 (!) 182/82  09/25/17 (!) 158/78  Patient is here for hypertension follow up. She was most recently seen for evaluation of hypertension three month ago (11/2017). At that time she was found to have SBP 182. Her chronic medication amlodipine 5 mg had been removed from her medication list at office visit in June when she reported that she had not been taking it and was found to have SBP 127. She was asked to resume amlodipine 5 mg daily and follow up. She does have a history of admission in 2017 for syncope related to  low blood pressures in the setting of poor oral intake however her oral intake has improved since that time. Today she is with her granddaughter who states she has been doing well. Her granddaughter notes that she has been out of her blood pressure medications for the past 5 days. Rachel [name] -Rachel [name] denies headache, chest pain, nausea or abdominal pain, difficulty breathing.  - resume amlodipine 5 mg daily  - follow up BMP   ADDENDUM 12/24 - Renal function consistent with baseline CKD 3. Mild hypernatremia.   Platelet count remains improved from earlier in the year. Red cell morphology consistent with iron deficiency however she is not anemic at this point. She is prescribed iron polysaccharides. The iron deficiency is thought to be related to GI losses but Rachel [name] is too frail to undergo colonoscopy.   On chart review it is clear that all of her medication refills were last sent in six months ago with three month supply. I will ask our triage nurse to call the pharmacy and check that she is obtaining all of the medications that she needs to. I am hopeful this information will be helpful with further advanced care planning and goals of care talks.   Hypothyroidism  ADDENDUM 12/27 - TSH is elevated, levothyroxine dose is either insufficient or not being administered correctly. Will await medication reconciliateion mentioned above to guide further management   Preventative health  -  received influenza vaccine  - received PCV 13   See Encounters Tab for problem based charting.  Patient seen with Dr. Evette Doffing

## 2018-03-15 ENCOUNTER — Ambulatory Visit (INDEPENDENT_AMBULATORY_CARE_PROVIDER_SITE_OTHER): Payer: Medicare HMO | Admitting: Internal Medicine

## 2018-03-15 ENCOUNTER — Other Ambulatory Visit: Payer: Self-pay

## 2018-03-15 VITALS — BP 189/97 | HR 53 | Temp 97.7°F

## 2018-03-15 DIAGNOSIS — I10 Essential (primary) hypertension: Secondary | ICD-10-CM

## 2018-03-15 DIAGNOSIS — Z89612 Acquired absence of left leg above knee: Secondary | ICD-10-CM | POA: Diagnosis not present

## 2018-03-15 DIAGNOSIS — N183 Chronic kidney disease, stage 3 (moderate): Secondary | ICD-10-CM | POA: Diagnosis not present

## 2018-03-15 DIAGNOSIS — Z79890 Hormone replacement therapy (postmenopausal): Secondary | ICD-10-CM

## 2018-03-15 DIAGNOSIS — D473 Essential (hemorrhagic) thrombocythemia: Secondary | ICD-10-CM | POA: Diagnosis not present

## 2018-03-15 DIAGNOSIS — Z66 Do not resuscitate: Secondary | ICD-10-CM

## 2018-03-15 DIAGNOSIS — E034 Atrophy of thyroid (acquired): Secondary | ICD-10-CM | POA: Diagnosis not present

## 2018-03-15 DIAGNOSIS — Z7189 Other specified counseling: Secondary | ICD-10-CM | POA: Diagnosis not present

## 2018-03-15 DIAGNOSIS — E039 Hypothyroidism, unspecified: Secondary | ICD-10-CM

## 2018-03-15 DIAGNOSIS — Z23 Encounter for immunization: Secondary | ICD-10-CM

## 2018-03-15 DIAGNOSIS — I129 Hypertensive chronic kidney disease with stage 1 through stage 4 chronic kidney disease, or unspecified chronic kidney disease: Secondary | ICD-10-CM | POA: Diagnosis not present

## 2018-03-15 DIAGNOSIS — E870 Hyperosmolality and hypernatremia: Secondary | ICD-10-CM

## 2018-03-15 DIAGNOSIS — Z79899 Other long term (current) drug therapy: Secondary | ICD-10-CM

## 2018-03-15 MED ORDER — AMLODIPINE BESYLATE 5 MG PO TABS: 5.0000 mg | ORAL_TABLET | Freq: Every day | ORAL | 3 refills | Status: AC

## 2018-03-15 NOTE — Progress Notes (Signed)
Internal Medicine Clinic Attending  I saw and evaluated the patient.  I personally confirmed the key portions of the history and exam documented by Dr. Blum and I reviewed pertinent patient test results.  The assessment, diagnosis, and plan were formulated together and I agree with the documentation in the resident's note. 

## 2018-03-15 NOTE — Assessment & Plan Note (Signed)
Discussed with granddaughter today, who is caretaker. Patient unable to discuss due to dementia. Goals remain palliative in our approach. She has a MOST form completed which says DNR/DNI. She would accept hospitalization for management of acute illness. Goal is to stay at home as much as possible, she has 24 hour help right now.

## 2018-03-15 NOTE — Assessment & Plan Note (Signed)
Jak-2 positiv ET with at least 2 arterial clotting events. The patient is doing well on Hydrea 1000mg  Monday and 500mg  every other day. Good compliance with med through grand daughter. They struggle to get frequent enough lab checks, last one was in July. We will check CBC and CMP today. If stable, I am hoping to get labs at least every 2-3 months in the future. Continue plavix and statin as well indefinitely.

## 2018-03-15 NOTE — Assessment & Plan Note (Signed)
Reportedly good compliance with synthroid. No symptoms of hypo or hyperthyroid. Check TSH today. Continue with synthroid 3mcg daily.

## 2018-03-15 NOTE — Assessment & Plan Note (Signed)
Chronic asymptomatic hypertension today, due to running out of antihypertensives. Will restart amlodipine 5mg  daily. Of all her meds, this seems to be the hardest to deliver to her consistently.

## 2018-03-15 NOTE — Patient Instructions (Signed)
Thank you for coming to the clinic today. It was a pleasure to see you.   For your hypertension, please go back on the amlodipine. Call the pharmacy ahead of time before you run out of your medications.   Today you received your flu and pneumonia vaccines   FOLLOW-UP INSTRUCTIONS When: 1 month with Dr. Evette Doffing or in the acute care clinic   For: follow up of your hypertension and platelets  What to bring: all of your medication bottles   Please call the internal medicine center clinic if you have any questions or concerns, we may be able to help and keep you from a long and expensive emergency room wait. Our clinic and after hours phone number is [phone], the best time to call is Monday through Friday 9 am to 4 pm but there is always someone available 24/7 if you have an emergency. If you need medication refills please notify your pharmacy one week in advance and they will send [place] a request.

## 2018-03-16 ENCOUNTER — Telehealth: Payer: Self-pay | Admitting: Internal Medicine

## 2018-03-16 LAB — CMP14 + ANION GAP
A/G RATIO: 1 — AB (ref 1.2–2.2)
ALK PHOS: 86 IU/L (ref 39–117)
ALT: 13 IU/L (ref 0–32)
ANION GAP: 17 mmol/L (ref 10.0–18.0)
AST: 19 IU/L (ref 0–40)
Albumin: 3.5 g/dL (ref 3.5–4.7)
BILIRUBIN TOTAL: 0.2 mg/dL (ref 0.0–1.2)
BUN / CREAT RATIO: 25 (ref 12–28)
BUN: 30 mg/dL — ABNORMAL HIGH (ref 8–27)
CHLORIDE: 105 mmol/L (ref 96–106)
CO2: 24 mmol/L (ref 20–29)
CREATININE: 1.22 mg/dL — AB (ref 0.57–1.00)
Calcium: 9.5 mg/dL (ref 8.7–10.3)
GFR calc Af Amer: 46 mL/min/{1.73_m2} — ABNORMAL LOW (ref >59)
GFR calc non Af Amer: 40 mL/min/{1.73_m2} — ABNORMAL LOW (ref >59)
GLOBULIN, TOTAL: 3.4 g/dL (ref 1.5–4.5)
Glucose: 73 mg/dL (ref 65–99)
POTASSIUM: 4.4 mmol/L (ref 3.5–5.2)
SODIUM: 146 mmol/L — AB (ref 134–144)
Total Protein: 6.9 g/dL (ref 6.0–8.5)

## 2018-03-16 LAB — CBC
Hematocrit: 43.2 % (ref 34.0–46.6)
Hemoglobin: 13 g/dL (ref 11.1–15.9)
MCH: 21.7 pg — ABNORMAL LOW (ref 26.6–33.0)
MCHC: 30.1 g/dL — ABNORMAL LOW (ref 31.5–35.7)
MCV: 72 fL — ABNORMAL LOW (ref 79–97)
PLATELETS: 362 10*3/uL (ref 150–450)
RBC: 5.98 x10E6/uL — AB (ref 3.77–5.28)
RDW: 23.9 % — ABNORMAL HIGH (ref 12.3–15.4)
WBC: 9.1 10*3/uL (ref 3.4–10.8)

## 2018-03-16 LAB — SPECIMEN STATUS REPORT

## 2018-03-16 NOTE — Telephone Encounter (Signed)
Hello, I am concerned that [name] may not be receiving all of the medications that she is prescribed when she picks up from the pharmacy. Would you mind calling her pharmacy and seeing the last two times that she picked up amlodipine, hydroxyurea, plavix, pravastatin and synthroid? I really appreciate your help.

## 2018-03-19 NOTE — Telephone Encounter (Signed)
Granddaughter states she makes sure she gets her meds daily and does not miss doses Called pharm Amlodipine filled last 12/27 #30 previous 9/25 #30 Hydrea last 8/7 #30 no other date available plavix 11/5 #30 7/3 #30 Pravastatin 7/3 #30 no other date available Synthroid 11/5 #30 previous #30 1/19

## 2018-03-21 LAB — TSH: TSH: 9.15 u[IU]/mL — AB (ref 0.450–4.500)

## 2018-03-25 NOTE — Telephone Encounter (Signed)
Thank you much Rachel [name]. I called and spoke with Rachel [name], she confirmed again that Rachel [name] is provided her medications everyday. We did however figure out that she is getting it at various times through the day, sometimes after eating and always with her other medications. We decided that Rachel [name] will adjust to giving the levothyroxine first thing in the morning and at least 30 minutes prior to other medications or food. She will follow up in one month for repeat TSH testing. I also encouraged her to continue to give her grandmothers medications daily.

## 2018-03-29 ENCOUNTER — Encounter: Payer: Medicare HMO | Admitting: Student in an Organized Health Care Education/Training Program

## 2018-04-15 ENCOUNTER — Encounter: Payer: Self-pay | Admitting: *Deleted

## 2018-05-25 ENCOUNTER — Encounter: Payer: Self-pay | Admitting: *Deleted

## 2018-06-07 ENCOUNTER — Encounter: Payer: Self-pay | Admitting: *Deleted

## 2018-06-25 ENCOUNTER — Telehealth: Payer: Self-pay | Admitting: Hematology

## 2018-06-25 ENCOUNTER — Encounter: Payer: Self-pay | Admitting: Hematology

## 2018-06-25 NOTE — Telephone Encounter (Signed)
A new hem appt has been scheduled for the pt to see Dr. Irene Limbo on 5/6 at 10am. Letter mailed.

## 2018-07-28 ENCOUNTER — Inpatient Hospital Stay: Payer: Medicare HMO

## 2018-07-28 ENCOUNTER — Inpatient Hospital Stay: Payer: Medicare HMO | Attending: Hematology | Admitting: Hematology

## 2018-07-28 ENCOUNTER — Telehealth: Payer: Self-pay | Admitting: *Deleted

## 2018-07-28 NOTE — Addendum Note (Signed)
Addended by: Hulan Fray on: 07/28/2018 06:32 PM   Modules accepted: Orders

## 2018-07-28 NOTE — Telephone Encounter (Signed)
Received message from [facility] staff screener that patient wanted to cancel/reschedule appt.  Called [phone] (M - main contact # for patient). Voice mail name was [name] (?pt granddaughter). Left genericVM to contact office at [phone] regarding appt at 10am w/Dr. Irene Limbo. Also called [phone] (H). No answer.

## 2018-10-28 ENCOUNTER — Other Ambulatory Visit: Payer: Self-pay | Admitting: Internal Medicine

## 2018-10-28 DIAGNOSIS — D471 Chronic myeloproliferative disease: Secondary | ICD-10-CM

## 2018-10-28 DIAGNOSIS — D473 Essential (hemorrhagic) thrombocythemia: Secondary | ICD-10-CM

## 2018-10-28 NOTE — Telephone Encounter (Signed)
Need follow up appointment with me ASAP

## 2018-10-30 DIAGNOSIS — R404 Transient alteration of awareness: Secondary | ICD-10-CM | POA: Diagnosis not present

## 2018-10-30 DIAGNOSIS — I469 Cardiac arrest, cause unspecified: Secondary | ICD-10-CM | POA: Diagnosis not present

## 2018-10-30 DIAGNOSIS — I499 Cardiac arrhythmia, unspecified: Secondary | ICD-10-CM | POA: Diagnosis not present

## 2018-11-15 ENCOUNTER — Ambulatory Visit: Payer: Medicare HMO | Admitting: Student in an Organized Health Care Education/Training Program

## 2018-11-23 DIAGNOSIS — 419620001 Death: Secondary | SNOMED CT | POA: Diagnosis not present

## 2018-11-23 DEATH — deceased
